# Patient Record
Sex: Female | Born: 1970 | Race: White | Hispanic: No | Marital: Married | Smoking: Current every day smoker
Health system: Southern US, Community
[De-identification: ages and names within clinical notes are randomized; demographics above are authoritative.]

## PROBLEM LIST (undated history)

## (undated) DIAGNOSIS — J45909 Unspecified asthma, uncomplicated: Secondary | ICD-10-CM

## (undated) DIAGNOSIS — K219 Gastro-esophageal reflux disease without esophagitis: Secondary | ICD-10-CM

## (undated) DIAGNOSIS — T8859XA Other complications of anesthesia, initial encounter: Secondary | ICD-10-CM

## (undated) DIAGNOSIS — N12 Tubulo-interstitial nephritis, not specified as acute or chronic: Secondary | ICD-10-CM

## (undated) DIAGNOSIS — I10 Essential (primary) hypertension: Secondary | ICD-10-CM

## (undated) HISTORY — PX: CHOLECYSTECTOMY: SHX55

## (undated) HISTORY — DX: Tubulo-interstitial nephritis, not specified as acute or chronic: N12

## (undated) HISTORY — PX: OTHER SURGICAL HISTORY: SHX169

## (undated) HISTORY — PX: BACK SURGERY: SHX140

## (undated) HISTORY — PX: NECK SURGERY: SHX720

---

## 2012-09-01 DIAGNOSIS — F419 Anxiety disorder, unspecified: Secondary | ICD-10-CM | POA: Insufficient documentation

## 2012-09-01 DIAGNOSIS — G8929 Other chronic pain: Secondary | ICD-10-CM | POA: Insufficient documentation

## 2013-09-03 DIAGNOSIS — M961 Postlaminectomy syndrome, not elsewhere classified: Secondary | ICD-10-CM | POA: Insufficient documentation

## 2013-09-13 DIAGNOSIS — M5136 Other intervertebral disc degeneration, lumbar region: Secondary | ICD-10-CM | POA: Insufficient documentation

## 2014-06-20 DIAGNOSIS — N63 Unspecified lump in unspecified breast: Secondary | ICD-10-CM | POA: Insufficient documentation

## 2014-06-20 DIAGNOSIS — N930 Postcoital and contact bleeding: Secondary | ICD-10-CM | POA: Insufficient documentation

## 2014-12-05 DIAGNOSIS — Z803 Family history of malignant neoplasm of breast: Secondary | ICD-10-CM | POA: Insufficient documentation

## 2014-12-05 DIAGNOSIS — Z9889 Other specified postprocedural states: Secondary | ICD-10-CM | POA: Insufficient documentation

## 2014-12-05 DIAGNOSIS — Z87891 Personal history of nicotine dependence: Secondary | ICD-10-CM | POA: Insufficient documentation

## 2016-05-07 DIAGNOSIS — M25559 Pain in unspecified hip: Secondary | ICD-10-CM | POA: Insufficient documentation

## 2016-08-16 DIAGNOSIS — M5412 Radiculopathy, cervical region: Secondary | ICD-10-CM | POA: Insufficient documentation

## 2018-03-04 DIAGNOSIS — R000 Tachycardia, unspecified: Secondary | ICD-10-CM

## 2018-03-04 HISTORY — DX: Tachycardia, unspecified: R00.0

## 2019-02-27 ENCOUNTER — Encounter: Payer: Self-pay | Admitting: Emergency Medicine

## 2019-02-27 ENCOUNTER — Ambulatory Visit (INDEPENDENT_AMBULATORY_CARE_PROVIDER_SITE_OTHER): Payer: Self-pay

## 2019-02-27 ENCOUNTER — Other Ambulatory Visit: Payer: Self-pay

## 2019-02-27 ENCOUNTER — Ambulatory Visit
Admission: EM | Admit: 2019-02-27 | Discharge: 2019-02-27 | Disposition: A | Discharge status: Home / Self Care | Payer: Self-pay | Attending: Emergency Medicine | Admitting: Emergency Medicine

## 2019-02-27 DIAGNOSIS — M79642 Pain in left hand: Secondary | ICD-10-CM

## 2019-02-27 DIAGNOSIS — S80211A Abrasion, right knee, initial encounter: Secondary | ICD-10-CM

## 2019-02-27 DIAGNOSIS — R2232 Localized swelling, mass and lump, left upper limb: Secondary | ICD-10-CM
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
## 2019-08-03 ENCOUNTER — Encounter (HOSPITAL_COMMUNITY): Payer: Self-pay | Admitting: Emergency Medicine

## 2019-08-03 ENCOUNTER — Emergency Department (HOSPITAL_COMMUNITY)
Admission: EM | Admit: 2019-08-03 | Discharge: 2019-08-04 | Disposition: A | Discharge status: Home / Self Care | Payer: PRIVATE HEALTH INSURANCE | Attending: Emergency Medicine | Admitting: Emergency Medicine

## 2019-08-03 DIAGNOSIS — R112 Nausea with vomiting, unspecified: Secondary | ICD-10-CM | POA: Insufficient documentation

## 2019-08-03 DIAGNOSIS — F17210 Nicotine dependence, cigarettes, uncomplicated: Secondary | ICD-10-CM | POA: Diagnosis not present

## 2019-08-03 DIAGNOSIS — R1031 Right lower quadrant pain: Secondary | ICD-10-CM | POA: Insufficient documentation

## 2019-08-03 LAB — CBC
HCT: 46 % (ref 36.0–46.0)
Hemoglobin: 15.5 g/dL — ABNORMAL HIGH (ref 12.0–15.0)
MCH: 34.4 pg — ABNORMAL HIGH (ref 26.0–34.0)
MCHC: 33.7 g/dL (ref 30.0–36.0)
MCV: 102.2 fL — ABNORMAL HIGH (ref 80.0–100.0)
Platelets: 272 10*3/uL (ref 150–400)
RBC: 4.5 MIL/uL (ref 3.87–5.11)
RDW: 13.4 % (ref 11.5–15.5)
WBC: 9.5 10*3/uL (ref 4.0–10.5)
nRBC: 0 % (ref 0.0–0.2)

## 2019-08-03 MED ORDER — SODIUM CHLORIDE 0.9% FLUSH: 3.0000 mL | Freq: Once | INTRAVENOUS | Status: DC

## 2019-08-03 MED ORDER — ONDANSETRON 4 MG PO TBDP
4.0000 mg | ORAL_TABLET | Freq: Once | ORAL | Status: AC | PRN
  Administered 2019-08-03: 4 mg via ORAL
  Filled 2019-08-03: qty 1

## 2019-08-03 NOTE — ED Triage Notes (Signed)
Patient in POV, reports abd pain, N/V X2 days that has gotten progressively worse. Patient actively dry heaving in triage.

## 2019-08-04 ENCOUNTER — Encounter (HOSPITAL_COMMUNITY): Payer: Self-pay | Admitting: Radiology

## 2019-08-04 ENCOUNTER — Emergency Department (HOSPITAL_COMMUNITY): Payer: PRIVATE HEALTH INSURANCE

## 2019-08-04 LAB — COMPREHENSIVE METABOLIC PANEL
ALT: 63 U/L — ABNORMAL HIGH (ref 0–44)
AST: 61 U/L — ABNORMAL HIGH (ref 15–41)
Albumin: 3.9 g/dL (ref 3.5–5.0)
Alkaline Phosphatase: 95 U/L (ref 38–126)
Anion gap: 13 (ref 5–15)
BUN: 6 mg/dL (ref 6–20)
CO2: 22 mmol/L (ref 22–32)
Calcium: 9.6 mg/dL (ref 8.9–10.3)
Chloride: 107 mmol/L (ref 98–111)
Creatinine, Ser: 0.56 mg/dL (ref 0.44–1.00)
GFR calc Af Amer: >60 mL/min (ref >60)
GFR calc non Af Amer: >60 mL/min (ref >60)
Glucose, Bld: 111 mg/dL — ABNORMAL HIGH (ref 70–99)
Potassium: 3.7 mmol/L (ref 3.5–5.1)
Sodium: 142 mmol/L (ref 135–145)
Total Bilirubin: 0.5 mg/dL (ref 0.3–1.2)
Total Protein: 7.3 g/dL (ref 6.5–8.1)

## 2019-08-04 LAB — URINALYSIS, ROUTINE W REFLEX MICROSCOPIC
Bacteria, UA: NONE SEEN
Bilirubin Urine: NEGATIVE
Glucose, UA: NEGATIVE mg/dL
Ketones, ur: NEGATIVE mg/dL
Leukocytes,Ua: NEGATIVE
Nitrite: NEGATIVE
Protein, ur: NEGATIVE mg/dL
Specific Gravity, Urine: >1.046 — ABNORMAL HIGH (ref 1.005–1.030)
pH: 5 (ref 5.0–8.0)

## 2019-08-04 LAB — HCG, QUANTITATIVE, PREGNANCY: hCG, Beta Chain, Quant, S: 1 m[IU]/mL (ref <5)

## 2019-08-04 LAB — LIPASE, BLOOD: Lipase: 43 U/L (ref 11–51)

## 2019-08-04 MED ORDER — ONDANSETRON HCL 4 MG/2ML IJ SOLN
4.0000 mg | Freq: Once | INTRAMUSCULAR | Status: AC
  Administered 2019-08-04: 4 mg via INTRAVENOUS
  Filled 2019-08-04: qty 2

## 2019-08-04 MED ORDER — IOHEXOL 300 MG/ML  SOLN
100.0000 mL | Freq: Once | INTRAMUSCULAR | Status: AC | PRN
  Administered 2019-08-04: 100 mL via INTRAVENOUS

## 2019-08-04 MED ORDER — SODIUM CHLORIDE 0.9 % IV BOLUS
1000.0000 mL | Freq: Once | INTRAVENOUS | Status: AC
  Administered 2019-08-04: 1000 mL via INTRAVENOUS

## 2019-08-04 MED ORDER — HYDROMORPHONE HCL 1 MG/ML IJ SOLN
1.0000 mg | Freq: Once | INTRAMUSCULAR | Status: AC
  Administered 2019-08-04: 1 mg via INTRAVENOUS
  Filled 2019-08-04: qty 1

## 2019-08-04 MED ORDER — HYDROMORPHONE HCL 1 MG/ML IJ SOLN
INTRAMUSCULAR | Status: AC
  Filled 2019-08-04: qty 1

## 2019-08-04 MED ORDER — ONDANSETRON HCL 4 MG/2ML IJ SOLN
INTRAMUSCULAR | Status: AC
  Filled 2019-08-04: qty 2

## 2019-08-04 MED ORDER — ONDANSETRON 4 MG PO TBDP: 4.0000 mg | ORAL_TABLET | Freq: Three times a day (TID) | ORAL | 0 refills | Status: DC | PRN

## 2019-08-04 NOTE — Discharge Instructions (Addendum)
Please drink plenty of fluids to stay hydrated Take Zofran as needed for nausea Try a heating pad on the abdomen for pain Return to the ER if you are worsening

## 2019-08-04 NOTE — ED Notes (Signed)
Pt transported to CT ?

## 2019-08-04 NOTE — ED Notes (Signed)
See downtime charting for med administration.

## 2019-08-04 NOTE — ED Provider Notes (Signed)
[facility] EMERGENCY DEPARTMENT Provider Note   [name]: [id number] Arrival date & time: 08/03/19  2244     History Chief Complaint  Patient presents with  . Emesis    Tammy Decker is a 49 y.o. female who presents with abdominal pain and vomiting. Pt states that she started to have abodminal pain and nausea yesterday morning. It's over the right lower part of the abdomen. She reports associated nausea, vomiting, and abdominal distension. Past surgical hx significant for cholecystectomy and endometrial ablation. She denies fever, chills, chest pain, SOB, diarrhea, constipation, urinary symptoms, vaginal bleeding or discharge. She states she recently moved to the area and is on vacation. She has been drinking some alcohol but denies excessive intake or Tylenol use. She denies having similar pain before. She had COVID in February and was told by her heart doctor she may have mild damage to her heart from the virus.  HPI     History reviewed. No pertinent past medical history.  There are no problems to display for this patient.   Past Surgical History:  Procedure Laterality Date  . uterine ablation       OB History   No obstetric history on file.     Family History  Problem Relation Age of Onset  . Diabetes Mother   . Diabetes Father     Social History   Tobacco Use  . Smoking status: Current Every Day Smoker    Packs/day: 0.15  . Smokeless tobacco: Never Used  Substance Use Topics  . Alcohol use: Yes    Comment: occasionally  . Drug use: Never    Home Medications Prior to Admission medications   Not on File    Allergies    Ciprofloxacin  Review of Systems   Review of Systems  Constitutional: Negative for chills and fever.  Respiratory: Negative for shortness of breath.   Cardiovascular: Negative for chest pain.  Gastrointestinal: Positive for abdominal pain, nausea and vomiting. Negative for constipation and diarrhea.    Genitourinary: Negative for dysuria, flank pain, pelvic pain, vaginal bleeding and vaginal discharge.  Musculoskeletal: Negative for back pain.  All other systems reviewed and are negative.   Physical Exam Updated Vital Signs BP (!) 130/106 (BP Location: Right Arm)   Pulse (!) 115   Temp 98.2 F (36.8 C) (Oral)   Resp 20   Ht 5\' 7"  (1.702 m)   Wt 81.6 kg   SpO2 97%   BMI 28.19 kg/m   Physical Exam Vitals and nursing note reviewed.  Constitutional:      General: She is in acute distress (in pain ).     Appearance: She is well-developed. She is obese. She is not ill-appearing.  HENT:     Head: Normocephalic and atraumatic.  Eyes:     General: No scleral icterus.       Right eye: No discharge.        Left eye: No discharge.     Conjunctiva/sclera: Conjunctivae normal.     Pupils: Pupils are equal, round, and reactive to light.  Cardiovascular:     Rate and Rhythm: Normal rate and regular rhythm.  Pulmonary:     Effort: Pulmonary effort is normal. No respiratory distress.     Breath sounds: Normal breath sounds.  Abdominal:     General: There is no distension.     Palpations: Abdomen is soft.     Tenderness: There is abdominal tenderness (rlq).  Musculoskeletal:  Cervical back: Normal range of motion.  Skin:    General: Skin is warm and dry.  Neurological:     Mental Status: She is alert and oriented to person, place, and time.  Psychiatric:        Behavior: Behavior normal.     ED Results / Procedures / Treatments   Labs (all labs ordered are listed, but only abnormal results are displayed) Labs Reviewed  COMPREHENSIVE METABOLIC PANEL - Abnormal; Notable for the following components:      Result Value   Glucose, Bld 111 (*)    AST 61 (*)    ALT 63 (*)    All other components within normal limits  CBC - Abnormal; Notable for the following components:   Hemoglobin 15.5 (*)    MCV 102.2 (*)    MCH 34.4 (*)    All other components within normal limits   URINALYSIS, ROUTINE W REFLEX MICROSCOPIC - Abnormal; Notable for the following components:   Specific Gravity, Urine >1.046 (*)    Hgb urine dipstick SMALL (*)    All other components within normal limits  LIPASE, BLOOD  HCG, QUANTITATIVE, PREGNANCY    EKG EKG Interpretation  Date/Time:  Tuesday August 03 2019 23:03:41 EDT Ventricular Rate:  102 PR Interval:  148 QRS Duration: 78 QT Interval:  346 QTC Calculation: 450 R Axis:   59 Text Interpretation: Poor data quality, interpretation may be adversely affected Sinus tachycardia Otherwise normal ECG Interpretation limited secondary to artifact Confirmed by Ripley Fraise [phone]) on 08/04/2019 12:08:48 AM   Radiology CT ABDOMEN PELVIS W CONTRAST  Result Date: 08/04/2019 CLINICAL DATA:  Right lower quadrant pain for several days, initial encounter EXAM: CT ABDOMEN AND PELVIS WITH CONTRAST TECHNIQUE: Multidetector CT imaging of the abdomen and pelvis was performed using the standard protocol following bolus administration of intravenous contrast. CONTRAST:  144mL OMNIPAQUE IOHEXOL 300 MG/ML  SOLN COMPARISON:  None. FINDINGS: Lower chest: No acute abnormality. Hepatobiliary: Fatty infiltration of the liver is noted. The gallbladder has been surgically removed. Pancreas: Unremarkable. No pancreatic ductal dilatation or surrounding inflammatory changes. Spleen: Multiple calcified granulomas are noted within the spleen. Adrenals/Urinary Tract: Adrenal glands are within normal limits. Kidneys demonstrate a normal enhancement pattern without renal calculi or obstructive changes. A few scattered small cysts are noted in the right kidney. No obstructive changes are noted. The ureters are unremarkable. The bladder is well distended. Stomach/Bowel: The appendix is within normal limits. No obstructive or inflammatory changes of the colon are seen. The small bowel and stomach appear within normal limits. Vascular/Lymphatic: No significant vascular findings  are present. No enlarged abdominal or pelvic lymph nodes. Reproductive: Follicular changes are noted bilaterally. Uterus is within normal limits. Other: No abdominal wall hernia or abnormality. No abdominopelvic ascites. Musculoskeletal: Spinal stimulator is noted. No acute bony abnormality is seen. IMPRESSION: Normal-appearing appendix. Chronic changes as described above without acute abnormality. Electronically Signed   By: Inez Catalina M.D.   On: 08/04/2019 01:19    Procedures Procedures (including critical care time)  Medications Ordered in ED Medications  sodium chloride flush (NS) 0.9 % injection 3 mL (3 mLs Intravenous Not Given 08/03/19 2351)  HYDROmorphone (DILAUDID) 1 MG/ML injection (has no administration in time range)  ondansetron (ZOFRAN) 4 MG/2ML injection (has no administration in time range)  ondansetron (ZOFRAN-ODT) disintegrating tablet 4 mg (4 mg Oral Given 08/03/19 2305)  sodium chloride 0.9 % bolus 1,000 mL (0 mLs Intravenous Stopped 08/04/19 0254)  HYDROmorphone (DILAUDID) injection 1 mg (  1 mg Intravenous Given 08/04/19 0010)  ondansetron (ZOFRAN) injection 4 mg (4 mg Intravenous Given 08/04/19 0010)  iohexol (OMNIPAQUE) 300 MG/ML solution 100 mL (100 mLs Intravenous Contrast Given 08/04/19 0052)    ED Course  I have reviewed the triage vital signs and the nursing notes.  Pertinent labs & imaging results that were available during my care of the patient were reviewed by me and considered in my medical decision making (see chart for details).  49 year old female presents with RLQ abdominal pain, N/V since yesterday morning. On initial exam she is distressed due to pain. Vitals are reassuring. Will give pain meds and antiemetics and reassess. CBC shows mild polycythemia and elevated MCV. She is a smoker and may be mildly dehydrated. CMP shows mild transaminitis. Lipase is normal. Will order CT A/P  12:55 AM Rechecked pt. She is feeling much better but still has abdominal pain. She  is tender in the RLQ. CT scan is pending.  CT is negative for acute pathology. UA is negative for UTI. Discussed with patient. Likely gastroenteritis. She could have a muscle strain due to emesis. We discussed pelvic exam but she declined. She tolerated PO. Will give rx for zofran and return precautions.  MDM Rules/Calculators/A&P                       Final Clinical Impression(s) / ED Diagnoses Final diagnoses:  Non-intractable vomiting with nausea, unspecified vomiting type    Rx / DC Orders ED Discharge Orders    None       Bethel Born, PA-C 08/04/19 0308    Zadie Rhine, MD 08/05/19 [phone]

## 2020-01-03 ENCOUNTER — Emergency Department (HOSPITAL_COMMUNITY): Payer: PRIVATE HEALTH INSURANCE

## 2020-01-03 ENCOUNTER — Emergency Department (HOSPITAL_COMMUNITY)
Admission: EM | Admit: 2020-01-03 | Discharge: 2020-01-03 | Disposition: A | Discharge status: Home / Self Care | Payer: PRIVATE HEALTH INSURANCE | Attending: Emergency Medicine | Admitting: Emergency Medicine

## 2020-01-03 ENCOUNTER — Encounter (HOSPITAL_COMMUNITY): Payer: Self-pay

## 2020-01-03 ENCOUNTER — Other Ambulatory Visit: Payer: Self-pay

## 2020-01-03 DIAGNOSIS — F17210 Nicotine dependence, cigarettes, uncomplicated: Secondary | ICD-10-CM | POA: Diagnosis not present

## 2020-01-03 DIAGNOSIS — R531 Weakness: Secondary | ICD-10-CM | POA: Diagnosis not present

## 2020-01-03 DIAGNOSIS — Z7982 Long term (current) use of aspirin: Secondary | ICD-10-CM | POA: Diagnosis not present

## 2020-01-03 DIAGNOSIS — Z8616 Personal history of COVID-19: Secondary | ICD-10-CM | POA: Diagnosis not present

## 2020-01-03 DIAGNOSIS — I10 Essential (primary) hypertension: Secondary | ICD-10-CM | POA: Diagnosis not present

## 2020-01-03 DIAGNOSIS — R0602 Shortness of breath: Secondary | ICD-10-CM | POA: Diagnosis not present

## 2020-01-03 HISTORY — DX: Essential (primary) hypertension: I10

## 2020-01-03 LAB — CBC
HCT: 44.9 % (ref 36.0–46.0)
Hemoglobin: 14.7 g/dL (ref 12.0–15.0)
MCH: 33.7 pg (ref 26.0–34.0)
MCHC: 32.7 g/dL (ref 30.0–36.0)
MCV: 103 fL — ABNORMAL HIGH (ref 80.0–100.0)
Platelets: 228 10*3/uL (ref 150–400)
RBC: 4.36 MIL/uL (ref 3.87–5.11)
RDW: 13.2 % (ref 11.5–15.5)
WBC: 7.4 10*3/uL (ref 4.0–10.5)
nRBC: 0 % (ref 0.0–0.2)

## 2020-01-03 LAB — BASIC METABOLIC PANEL
Anion gap: 11 (ref 5–15)
BUN: <5 mg/dL — ABNORMAL LOW (ref 6–20)
CO2: 21 mmol/L — ABNORMAL LOW (ref 22–32)
Calcium: 8.6 mg/dL — ABNORMAL LOW (ref 8.9–10.3)
Chloride: 106 mmol/L (ref 98–111)
Creatinine, Ser: 0.49 mg/dL (ref 0.44–1.00)
GFR, Estimated: >60 mL/min (ref >60)
Glucose, Bld: 110 mg/dL — ABNORMAL HIGH (ref 70–99)
Potassium: 3.7 mmol/L (ref 3.5–5.1)
Sodium: 138 mmol/L (ref 135–145)

## 2020-01-03 LAB — URINALYSIS, ROUTINE W REFLEX MICROSCOPIC
Bilirubin Urine: NEGATIVE
Glucose, UA: NEGATIVE mg/dL
Hgb urine dipstick: NEGATIVE
Ketones, ur: 5 mg/dL — AB
Leukocytes,Ua: NEGATIVE
Nitrite: POSITIVE — AB
Protein, ur: NEGATIVE mg/dL
Specific Gravity, Urine: 1.023 (ref 1.005–1.030)
pH: 5 (ref 5.0–8.0)

## 2020-01-03 LAB — D-DIMER, QUANTITATIVE: D-Dimer, Quant: 0.56 ug/mL-FEU — ABNORMAL HIGH (ref 0.00–0.50)

## 2020-01-03 LAB — TROPONIN I (HIGH SENSITIVITY)
Troponin I (High Sensitivity): 6 ng/L (ref <18)
Troponin I (High Sensitivity): 5 ng/L (ref <18)

## 2020-01-03 LAB — RESPIRATORY PANEL BY RT PCR (FLU A&B, COVID)
Influenza A by PCR: NEGATIVE
Influenza B by PCR: NEGATIVE
SARS Coronavirus 2 by RT PCR: NEGATIVE

## 2020-01-03 LAB — BRAIN NATRIURETIC PEPTIDE: B Natriuretic Peptide: 47.4 pg/mL (ref 0.0–100.0)

## 2020-01-03 LAB — I-STAT BETA HCG BLOOD, ED (MC, WL, AP ONLY): I-stat hCG, quantitative: <5 m[IU]/mL (ref <5)

## 2020-01-03 MED ORDER — ALBUTEROL SULFATE HFA 108 (90 BASE) MCG/ACT IN AERS
1.0000 | INHALATION_SPRAY | Freq: Four times a day (QID) | RESPIRATORY_TRACT | 0 refills | Status: DC | PRN
Start: 2020-01-03 — End: 2020-02-15

## 2020-01-03 MED ORDER — IOHEXOL 350 MG/ML SOLN
75.0000 mL | Freq: Once | INTRAVENOUS | Status: AC | PRN
  Administered 2020-01-03: 75 mL via INTRAVENOUS

## 2020-01-03 NOTE — ED Provider Notes (Signed)
[facility] EMERGENCY DEPARTMENT Provider Note   [name]: [id number] Arrival date & time: 01/03/20  3329     History Chief Complaint  Patient presents with  . Weakness  . Shortness of Breath    Tammy Decker is a 49 y.o. female.  HPI 49 year old female with history of hypertension presents to the ER with complaints of increasing weakness, shortness of breath over the last few days.  Patient states that she had Covid in February and since then has never gotten back to her baseline.  She was diagnosed with hypertension after her illness, and has been seeing cardiology.  She has also been having some worsening shortness of breath at night and was given supplemental oxygen to help with this.  She has no prior history of COPD, though she does endorse a 1-1 and half pack per day smoking history up until she had Covid.  She has not smoked since.  She has been using 2 L nasal cannula.  Denies any fevers or chills.  She states that she has worsening shortness of breath when she lays, especially on her left side.  Denies any chest pain.  She has also had a dry nonproductive cough.  She was told by her cardiologist to come in in the ER.  She has not noticed any lower extremity swelling.  She denies any recent travel, surgeries, not on supplemental estrogen.    Past Medical History:  Diagnosis Date  . Hypertension     There are no problems to display for this patient.   Past Surgical History:  Procedure Laterality Date  . BACK SURGERY    . NECK SURGERY    . uterine ablation       OB History   No obstetric history on file.     Family History  Problem Relation Age of Onset  . Diabetes Mother   . Diabetes Father     Social History   Tobacco Use  . Smoking status: Current Every Day Smoker    Packs/day: 0.15  . Smokeless tobacco: Never Used  Substance Use Topics  . Alcohol use: Yes    Comment: occasionally  . Drug use: Never    Home Medications Prior  to Admission medications   Medication Sig Start Date End Date Taking? Authorizing Provider  albuterol (VENTOLIN HFA) 108 (90 Base) MCG/ACT inhaler Inhale 1-2 puffs into the lungs every 6 (six) hours as needed for wheezing or shortness of breath. 01/03/20   Mare Ferrari, PA-C  aspirin EC 81 MG tablet Take 81 mg by mouth daily.    [provider]  loperamide (IMODIUM A-D) 2 MG tablet Take 2 mg by mouth 4 (four) times daily as needed for diarrhea or loose stools.    [provider]  metoprolol succinate (TOPROL-XL) 50 MG 24 hr tablet Take 50 mg by mouth at bedtime. 07/11/19   [provider]  omeprazole (PRILOSEC OTC) 20 MG tablet Take 20 mg by mouth daily as needed (for acid reflux).    [provider]  ondansetron (ZOFRAN ODT) 4 MG disintegrating tablet Take 1 tablet (4 mg total) by mouth every 8 (eight) hours as needed for nausea or vomiting. 08/04/19   Bethel Born, PA-C    Allergies    Ciprofloxacin  Review of Systems   Review of Systems  Constitutional: Negative for chills and fever.  HENT: Negative for ear pain and sore throat.   Eyes: Negative for pain and visual disturbance.  Respiratory:  Positive for cough and shortness of breath.   Cardiovascular: Negative for chest pain, palpitations and leg swelling.  Gastrointestinal: Negative for abdominal pain and vomiting.  Genitourinary: Negative for dysuria and hematuria.  Musculoskeletal: Negative for arthralgias and back pain.  Skin: Negative for color change and rash.  Neurological: Negative for seizures and syncope.  All other systems reviewed and are negative.   Physical Exam Updated Vital Signs BP 114/75   Pulse 89   Temp 98.4 F (36.9 C) (Oral)   Resp 20   Ht 5\' 7"  (1.702 m)   Wt 86.2 kg   SpO2 98% Comment: remained 97-98% during ambulation, pt reports feeling sob nol obvious signs of distrtess   BMI 29.76 kg/m   Physical Exam Vitals and nursing note reviewed.  Constitutional:        General: She is not in acute distress.    Appearance: She is well-developed. She is not ill-appearing, toxic-appearing or diaphoretic.  HENT:     Head: Normocephalic and atraumatic.  Eyes:     Conjunctiva/sclera: Conjunctivae normal.  Cardiovascular:     Rate and Rhythm: Normal rate and regular rhythm.     Heart sounds: No murmur heard.   Pulmonary:     Effort: Pulmonary effort is normal. No respiratory distress.     Breath sounds: Normal breath sounds. No decreased breath sounds, wheezing, rhonchi or rales.     Comments: Currently on 2 L nasal cannula.  Respirations equal and unlabored, speaking full sentences without increased work of breathing.  Lung sounds clear without any evidence of rales, wheezes, rhonchi. Chest:     Chest wall: No tenderness.  Abdominal:     Palpations: Abdomen is soft.     Tenderness: There is no abdominal tenderness.  Musculoskeletal:     Cervical back: Neck supple.     Right lower leg: No tenderness. No edema.     Left lower leg: No tenderness. No edema.  Skin:    General: Skin is warm and dry.     Findings: No erythema or rash.  Neurological:     General: No focal deficit present.     Mental Status: She is alert.  Psychiatric:        Mood and Affect: Mood normal.        Behavior: Behavior normal.     ED Results / Procedures / Treatments   Labs (all labs ordered are listed, but only abnormal results are displayed) Labs Reviewed  BASIC METABOLIC PANEL - Abnormal; Notable for the following components:      Result Value   CO2 21 (*)    Glucose, Bld 110 (*)    BUN <5 (*)    Calcium 8.6 (*)    All other components within normal limits  CBC - Abnormal; Notable for the following components:   MCV 103.0 (*)    All other components within normal limits  URINALYSIS, ROUTINE W REFLEX MICROSCOPIC - Abnormal; Notable for the following components:   Color, Urine AMBER (*)    Ketones, ur 5 (*)    Nitrite POSITIVE (*)    Bacteria, UA RARE (*)     All other components within normal limits  D-DIMER, QUANTITATIVE (NOT AT [facility]) - Abnormal; Notable for the following components:   D-Dimer, Quant 0.56 (*)    All other components within normal limits  RESPIRATORY PANEL BY RT PCR (FLU A&B, COVID)  BRAIN NATRIURETIC PEPTIDE  I-STAT BETA HCG BLOOD, ED ([facility], [facility], [facility] ONLY)  TROPONIN I (  HIGH SENSITIVITY)  TROPONIN I (HIGH SENSITIVITY)    EKG None  Radiology DG Chest 2 View  Result Date: 01/03/2020 CLINICAL DATA:  Chest pain shortness of breath. Pain under LEFT clavicle since Friday, worse last night, history of hypertension EXAM: CHEST - 2 VIEW COMPARISON:  Abdomen pelvis CT from August 04, 2019 FINDINGS: Signs of prior cervical spinal fusion.  The Spinal nerve stimulator remains in place. Power pack off field of view. Trachea midline. Cardiomediastinal contours and hilar structures are normal. Linear opacities present at the LEFT lung base. No lobar consolidation. No sign of pleural effusion. On limited assessment no acute skeletal process. IMPRESSION: Linear opacities at the LEFT lung base, likely atelectasis. Electronically Signed   By: Donzetta Kohut M.D.   On: 01/03/2020 08:10   CT Angio Chest PE W and/or Wo Contrast  Result Date: 01/03/2020 CLINICAL DATA:  Chest pain or SOB, pleurisy or effusion suspected Chest pain and shortness of breath. EXAM: CT ANGIOGRAPHY CHEST WITH CONTRAST TECHNIQUE: Multidetector CT imaging of the chest was performed using the standard protocol during bolus administration of intravenous contrast. Multiplanar CT image reconstructions and MIPs were obtained to evaluate the vascular anatomy. CONTRAST:  42mL OMNIPAQUE IOHEXOL 350 MG/ML SOLN COMPARISON:  Chest radiographs earlier today. FINDINGS: Cardiovascular: There are no filling defects within the pulmonary arteries to suggest pulmonary embolus. The thoracic aorta is normal in caliber without dissection. Conventional branching pattern from the aortic arch. Upper normal heart  size. No pericardial effusion. No significant coronary artery calcifications. Mediastinum/Nodes: Calcified right hilar lymph nodes consistent with prior granulomatous disease. No noncalcified adenopathy. Decompressed esophagus without wall thickening. No visualized thyroid nodule. Lungs/Pleura: Calcified nodule in the right lower lobe consistent with prior granulomatous disease. No noncalcified pulmonary nodules. Linear opacity in the lingula typical of atelectasis. Minor additional hypoventilatory changes in the dependent lungs. No confluent consolidation or evidence of pneumonia. No findings of pulmonary edema. No pleural fluid. Minor central bronchial thickening. Upper Abdomen: Calcified granuloma in the spleen. No acute upper abdominal findings. Musculoskeletal: Spinal stimulator in place. There are no acute or suspicious osseous abnormalities. Review of the MIP images confirms the above findings. IMPRESSION: 1. No pulmonary embolus. 2. Minor central bronchial thickening, can be seen with smoking, bronchitis or reactive airways disease. 3. Linear subsegmental atelectasis in the lingula. 4. Sequela of prior granulomatous disease with calcified pulmonary nodule, right hilar lymph nodes, and calcified granuloma in the spleen. Electronically Signed   By: Narda Rutherford M.D.   On: 01/03/2020 16:17    Procedures Procedures (including critical care time)  Medications Ordered in ED Medications  iohexol (OMNIPAQUE) 350 MG/ML injection 75 mL (75 mLs Intravenous Contrast Given 01/03/20 1603)    ED Course  I have reviewed the triage vital signs and the nursing notes.  Pertinent labs & imaging results that were available during my care of the patient were reviewed by me and considered in my medical decision making (see chart for details).    MDM Rules/Calculators/A&P                          Pleasant 49 year old female with complaints of worsening shortness of breath over the last several weeks.  On  presentation, she is alert, oriented, nontoxic-appearing, no acute distress, speaking full sentences without increased work of breathing.  On 2 L nasal cannula in the room.  Physical exam without noticeable wheezes, rhonchi or rales.  Vitals overall reassuring, no evidence of tachypnea or hypoxia.  Abdomen soft and nontender.  No excessive lower extremity edema noted.  DDx includes pneumonia, CHF, COPD, cardiomyopathy after Covid, viral infection, PE, anemia  Labs reviewed and interpreted by me -CBC without leukocytosis, normal hemoglobin -BMP without any significant electrode abnormalities, normal renal function, mildly decreased bicarb normal anion gap -Pregnancy negative -BNP is normal -Covid and flu is negative -D-dimer slightly elevated at 0.56 -Delta troponin is negative -UA with positive nitrates, rare bacteria, however patient does not endorse any dysuria, though notes some frequency  Imaging reviewed and interpreted by me -Chest x-ray with linear opacities in the left lung base.  No clear evidence of infection or fluid overload  ED EKG reviewed by me -Normal sinus rhythm -Given elevated D-dimer, CT PE study was ordered.  This was negative for PE, however did note some central bronchial thickening, evidence of sequela of granulomatous disease with calcified pulmonary nodule, right hilar lymph nodes and calcified granuloma in the spleen.  No evidence of acute life-threatening cause at significant risk  MDM: Patient had a fairly thorough work-up without clear evidence of causes of progressive shortness of breath.  She ambulated here in the ED without evidence of hypoxia without oxygen.  Will provide the patient with albuterol and refer to pulmonology.  Encouraged follow-up with her PCP as well. Will provide albuterol inhaler.  Return precautions discussed.  She voices understanding and is agreeable  Case discussed w/ Dr. Freida Busman who is agreeable to the above plan and disposition     Final Clinical Impression(s) / ED Diagnoses Final diagnoses:  Shortness of breath    Rx / DC Orders ED Discharge Orders         Ordered    albuterol (VENTOLIN HFA) 108 (90 Base) MCG/ACT inhaler  Every 6 hours PRN        01/03/20 1739           Leone Brand 01/03/20 1739    Lorre Nick, MD 01/03/20 2315

## 2020-01-03 NOTE — ED Notes (Signed)
Patient transported to CT 

## 2020-01-03 NOTE — ED Triage Notes (Signed)
Pt from home with ems c.o increased weakness and sob over the past few days, intermittent pain under her left collar bone. Pt has oxygen at home she wears as needed and she has been using it. Room air saturations 98%. Resp e.u

## 2020-01-03 NOTE — Discharge Instructions (Signed)
Please make sure to follow-up with with our pulmonology for further evaluation of your shortness of breath.  You may use the albuterol as needed.  Return to the ER for any new or worsening symptoms.

## 2020-02-15 ENCOUNTER — Encounter: Payer: Self-pay | Admitting: Internal Medicine

## 2020-02-15 ENCOUNTER — Ambulatory Visit (INDEPENDENT_AMBULATORY_CARE_PROVIDER_SITE_OTHER): Payer: No Typology Code available for payment source | Admitting: Internal Medicine

## 2020-02-15 ENCOUNTER — Other Ambulatory Visit: Payer: Self-pay

## 2020-02-15 VITALS — BP 142/84 | HR 118 | Temp 97.5°F | Ht 67.0 in | Wt 234.2 lb

## 2020-02-15 DIAGNOSIS — F17200 Nicotine dependence, unspecified, uncomplicated: Secondary | ICD-10-CM

## 2020-02-15 DIAGNOSIS — J310 Chronic rhinitis: Secondary | ICD-10-CM

## 2020-02-15 DIAGNOSIS — Z8616 Personal history of COVID-19: Secondary | ICD-10-CM | POA: Diagnosis not present

## 2020-02-15 DIAGNOSIS — R0602 Shortness of breath: Secondary | ICD-10-CM | POA: Diagnosis not present

## 2020-02-15 MED ORDER — FLUTICASONE PROPIONATE 50 MCG/ACT NA SUSP: 1.0000 | Freq: Every day | NASAL | 5 refills | Status: DC

## 2020-02-15 MED ORDER — ALBUTEROL SULFATE HFA 108 (90 BASE) MCG/ACT IN AERS
1.0000 | INHALATION_SPRAY | Freq: Four times a day (QID) | RESPIRATORY_TRACT | 11 refills | Status: DC | PRN
Start: 2020-02-15 — End: 2021-05-23

## 2020-02-15 MED ORDER — BUDESONIDE-FORMOTEROL FUMARATE 160-4.5 MCG/ACT IN AERO: 2.0000 | INHALATION_SPRAY | Freq: Two times a day (BID) | RESPIRATORY_TRACT | 12 refills | Status: DC

## 2020-02-15 NOTE — Progress Notes (Signed)
Tammy Decker    [id number]    1970/11/09  Primary Care Physician:Harwani, Marlane Mingle, MD  Referring Physician: Rinaldo Cloud, MD [phone] [place],  [place] [address] Reason for Consultation: "shortness of breath" Date of Consultation: 02/15/2020  Chief complaint:   Chief Complaint  Patient presents with  . Consult    Covid dx back in Feb 2021--  [facility] since that time.  Feels that this is worse with laying on her left side.  On oxygen 3 liters at bedtime.  Seen by cardiology for HTN. SHOB is worse with exertion.      HPI: Tammy Decker is a 49 y.o. woman with history of tobacco use disorder and covid 60 infection Feb 2021 who presents for new patient evaluation for shortness of breath.  She was not treated with steroids.  Prior to February she exercised daily, didn't take any medications. Since covid diagnosis she has been getting sob with walking. She has now gained 45 lbs since covid and has trouble with walking and pilates. She was not hospitalized with covid but was put on oxygen by her cardiologist. She has a puls oximeter at home and it drops down to 88-89%. She is also on metoprolol 200 mg daily for resting tachycardia.   Has been on albuterol inhaler for about a month which does help. She uses it about 2-3 times/day. Smoking cigarettes but is trying to cut back.   She notices that breathing gets worse when she sleeps on her left side as well as with exertion.  She does have a dry cough with chronic rhinitis and has a change in the quality of her voice.   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  Smoking history: she is a smoker but is trying to quit. Used to be 1ppd x 20 years.   Social History   Occupational History  . Not on file  Tobacco Use  . Smoking status: Current Every Day Smoker    Packs/day: 0.15  . Smokeless tobacco:  Never Used  . Tobacco comment: down to 3 cigs per day  Substance and Sexual Activity  . Alcohol use: Yes    Comment: occasionally  . Drug use: Never  . Sexual activity: Not on file    Relevant family history:  Family History  Problem Relation Age of Onset  . Diabetes Mother   . Diabetes Father     Past Medical History:  Diagnosis Date  . Hypertension     Past Surgical History:  Procedure Laterality Date  . BACK SURGERY    . NECK SURGERY    . uterine ablation       Physical Exam: Blood pressure (!) 142/84, pulse (!) 118, temperature (!) 97.5 F (36.4 C), temperature source Tympanic, height 5\' 7"  (1.702 m), weight 234 lb 4 oz (106.3 kg), SpO2 97 %. Gen:      No acute distress ENT:  Mild nasal debris, no nasal polyps, mucus membranes moist Lungs:    No increased respiratory effort, symmetric chest wall excursion, clear to auscultation bilaterally, no wheezes or crackles CV:         Tachycardic, regular rhythm; no murmurs, rubs, or gallops.  No pedal edema Abd:      + bowel sounds; soft, non-tender; no distension MSK: no acute synovitis of DIP or PIP joints, no mechanics hands.  Skin:  Warm and dry; no rashes Neuro: normal speech, no focal facial asymmetry Psych: alert and oriented x3, normal mood and affect   Data Reviewed/Medical Decision Making:  Independent interpretation of tests: Imaging: . Review of patient's CT Angio Nov 11th 2021 images revealed central peribronchial thickening, no PE, RLL calcified nodule. The patient's images have been independently reviewed by me.    PFTs: None  Labs:  Lab Results  Component Value Date   WBC 7.4 01/03/2020   HGB 14.7 01/03/2020   HCT 44.9 01/03/2020   MCV 103.0 (H) 01/03/2020   PLT 228 01/03/2020   Lab Results  Component Value Date   NA 138 01/03/2020   K 3.7 01/03/2020   CL 106 01/03/2020   CO2 21 (L) 01/03/2020     Immunization status:  Immunization History  Administered Date(s) Administered  .  Tdap 11/25/2014    . I reviewed prior external note(s) from ED visits . I reviewed the result(s) of the labs and imaging as noted above.  . I have ordered PFT   Assessment:  Shortness of breath - suspected COPD Tobacco Use Disorder COVID 19 infection  Plan/Recommendations: Albuterol inhaler prn Suspect she will benefit from ICS-LABA. Will start symbicort.  Will obtain full set of PFTs  I personally spent 6 minutes counseling the patient regarding tobacco use disorder.  Patient is symptomatic from tobacco use disorder due to the following condition: COPD.  The patient's response was contemplative.  We discussed nicotine replacement therapy, Wellbutrin.  We identified to gather patient specific barriers to change.  The patient is open to future discussions about tobacco cessation.  We discussed disease management and progression at length today.    Return to Care: Return in about 2 months (around 04/17/2020).  Durel Salts, MD Pulmonary and Critical Care Medicine [facility] Office:[phone]  CC: Rinaldo Cloud, MD

## 2020-02-15 NOTE — Patient Instructions (Signed)
The patient should have follow up scheduled with myself in 2 months.   Prior to next visit patient should have: Full set of PFTs  Flonase - 1 spray on each side of your nose twice a day for first week, then 1 spray on each side.   Instructions for use:  If you also use a saline nasal spray or rinse, use that first.  Position the head with the chin slightly tucked. Use the right hand to spray into the left nostril and the right hand to spray into the left nostril.   Point the bottle away from the septum of your nose (cartilage that divides the two sides of your nose).   Hold the nostril closed on the opposite side from where you will spray  Spray once and gently sniff to pull the medicine into the higher parts of your nose.  Don't sniff too hard as the medicine will drain down the back of your throat instead.  Repeat with a second spray on the same side if prescribed.  Repeat on the other side of your nose.  Take the albuterol rescue inhaler every 4 to 6 hours as needed for wheezing or shortness of breath. You can also take it 15 minutes before exercise or exertional activity. Side effects include heart racing or pounding, jitters or anxiety. If you have a history of an irregular heart rhythm, it can make this worse. Can also give some patients a hard time sleeping.  To inhale the aerosol using an inhaler, follow these steps:  1. Remove the protective dust cap from the end of the mouthpiece. If the dust cap was not placed on the mouthpiece, check the mouthpiece for dirt or other objects. Be sure that the canister is fully and firmly inserted in the mouthpiece. 2. If you are using the inhaler for the first time or if you have not used the inhaler in more than 14 days, you will need to prime it. You may also need to prime the inhaler if it has been dropped. Ask your pharmacist or check the manufacturer's information if this happens. To prime the inhaler, shake it well and then press down on  the canister 4 times to release 4 sprays into the air, away from your face. Be careful not to get albuterol in your eyes. 3. Shake the inhaler well. 4. Breathe out as completely as possible through your mouth. 4. Hold the canister with the mouthpiece on the bottom, facing you and the canister pointing upward. Place the open end of the mouthpiece into your mouth. Close your lips tightly around the mouthpiece. 6. Breathe in slowly and deeply through the mouthpiece.At the same time, press down once on the container to spray the medication into your mouth. 7. Try to hold your breath for 10 seconds. remove the inhaler, and breathe out slowly. 8. If you were told to use 2 puffs, wait 1 minute and then repeat steps 3-7. 9. Replace the protective cap on the inhaler. 10. Clean your inhaler regularly. Follow the manufacturer's directions carefully and ask your doctor or pharmacist if you have any questions about cleaning your inhaler.  Check the back of the inhaler to keep track of the total number of doses left on the inhaler.

## 2020-04-09 ENCOUNTER — Other Ambulatory Visit: Payer: Self-pay

## 2020-04-09 ENCOUNTER — Emergency Department (HOSPITAL_COMMUNITY)
Admission: EM | Admit: 2020-04-09 | Discharge: 2020-04-10 | Disposition: A | Discharge status: Home / Self Care | Payer: PRIVATE HEALTH INSURANCE | Attending: Emergency Medicine | Admitting: Emergency Medicine

## 2020-04-09 DIAGNOSIS — R42 Dizziness and giddiness: Secondary | ICD-10-CM | POA: Diagnosis not present

## 2020-04-09 DIAGNOSIS — K921 Melena: Secondary | ICD-10-CM | POA: Insufficient documentation

## 2020-04-09 DIAGNOSIS — R140 Abdominal distension (gaseous): Secondary | ICD-10-CM | POA: Diagnosis not present

## 2020-04-09 DIAGNOSIS — Z5321 Procedure and treatment not carried out due to patient leaving prior to being seen by health care provider: Secondary | ICD-10-CM | POA: Diagnosis not present

## 2020-04-09 DIAGNOSIS — R109 Unspecified abdominal pain: Secondary | ICD-10-CM | POA: Diagnosis not present

## 2020-04-09 LAB — COMPREHENSIVE METABOLIC PANEL
ALT: 57 U/L — ABNORMAL HIGH (ref 0–44)
AST: 74 U/L — ABNORMAL HIGH (ref 15–41)
Albumin: 3.5 g/dL (ref 3.5–5.0)
Alkaline Phosphatase: 87 U/L (ref 38–126)
Anion gap: 11 (ref 5–15)
BUN: 9 mg/dL (ref 6–20)
CO2: 21 mmol/L — ABNORMAL LOW (ref 22–32)
Calcium: 8.7 mg/dL — ABNORMAL LOW (ref 8.9–10.3)
Chloride: 105 mmol/L (ref 98–111)
Creatinine, Ser: 0.53 mg/dL (ref 0.44–1.00)
GFR, Estimated: >60 mL/min (ref >60)
Glucose, Bld: 117 mg/dL — ABNORMAL HIGH (ref 70–99)
Potassium: 4.1 mmol/L (ref 3.5–5.1)
Sodium: 137 mmol/L (ref 135–145)
Total Bilirubin: 0.5 mg/dL (ref 0.3–1.2)
Total Protein: 7.1 g/dL (ref 6.5–8.1)

## 2020-04-09 LAB — CBC
HCT: 41.5 % (ref 36.0–46.0)
Hemoglobin: 14.2 g/dL (ref 12.0–15.0)
MCH: 36.2 pg — ABNORMAL HIGH (ref 26.0–34.0)
MCHC: 34.2 g/dL (ref 30.0–36.0)
MCV: 105.9 fL — ABNORMAL HIGH (ref 80.0–100.0)
Platelets: 234 10*3/uL (ref 150–400)
RBC: 3.92 MIL/uL (ref 3.87–5.11)
RDW: 13.4 % (ref 11.5–15.5)
WBC: 9.2 10*3/uL (ref 4.0–10.5)
nRBC: 0 % (ref 0.0–0.2)

## 2020-04-09 LAB — TYPE AND SCREEN
ABO/RH(D): O POS
Antibody Screen: NEGATIVE

## 2020-04-09 NOTE — ED Triage Notes (Signed)
Pt presents to ED POV. Pt c/o abd pain, distention, dizziness, and dark stools. Pt reports that she has noticed dark, loose stool for a few days. And reports feeling light headed upon standing

## 2020-04-10 MED ORDER — ACETAMINOPHEN 325 MG PO TABS: 650.0000 mg | ORAL_TABLET | Freq: Once | ORAL | Status: DC

## 2020-04-10 MED ORDER — IBUPROFEN 400 MG PO TABS: 400.0000 mg | ORAL_TABLET | Freq: Once | ORAL | Status: DC

## 2020-04-10 MED ORDER — ACETAMINOPHEN 325 MG PO TABS
650.0000 mg | ORAL_TABLET | Freq: Once | ORAL | Status: AC
  Administered 2020-04-10: 650 mg via ORAL

## 2020-04-10 NOTE — ED Notes (Signed)
Pt spoke with tech and decided to follow up with their pcp. Pt left at this time.

## 2020-04-22 ENCOUNTER — Inpatient Hospital Stay (HOSPITAL_COMMUNITY): Admission: RE | Admit: 2020-04-22 | Payer: No Typology Code available for payment source | Source: Ambulatory Visit

## 2020-04-24 ENCOUNTER — Ambulatory Visit: Payer: No Typology Code available for payment source | Admitting: Internal Medicine

## 2020-04-27 ENCOUNTER — Telehealth (HOSPITAL_COMMUNITY): Payer: Self-pay | Admitting: Gastroenterology

## 2020-04-27 ENCOUNTER — Other Ambulatory Visit (HOSPITAL_COMMUNITY): Payer: Self-pay | Admitting: Gastroenterology

## 2020-04-27 ENCOUNTER — Other Ambulatory Visit: Payer: Self-pay | Admitting: Gastroenterology

## 2020-04-27 DIAGNOSIS — R1084 Generalized abdominal pain: Secondary | ICD-10-CM

## 2020-04-27 NOTE — Telephone Encounter (Signed)
04/27/20~Called office ref AUTH required for CT Abd/Pelv w contrast, was transf by Delorise Jackson to VM for [facility]. Left detailed VM. NO Auth onfile, per [facility]. MF

## 2020-05-01 ENCOUNTER — Ambulatory Visit (HOSPITAL_COMMUNITY)
Admission: RE | Admit: 2020-05-01 | Discharge: 2020-05-01 | Disposition: A | Discharge status: Home / Self Care | Payer: No Typology Code available for payment source | Source: Ambulatory Visit | Attending: Gastroenterology | Admitting: Gastroenterology

## 2020-05-01 ENCOUNTER — Emergency Department (HOSPITAL_COMMUNITY)
Admission: EM | Admit: 2020-05-01 | Discharge: 2020-05-01 | Disposition: A | Discharge status: Home / Self Care | Payer: No Typology Code available for payment source | Attending: Emergency Medicine | Admitting: Emergency Medicine

## 2020-05-01 ENCOUNTER — Other Ambulatory Visit: Payer: Self-pay

## 2020-05-01 ENCOUNTER — Encounter (HOSPITAL_COMMUNITY): Payer: Self-pay | Admitting: Emergency Medicine

## 2020-05-01 DIAGNOSIS — F17210 Nicotine dependence, cigarettes, uncomplicated: Secondary | ICD-10-CM | POA: Insufficient documentation

## 2020-05-01 DIAGNOSIS — K460 Unspecified abdominal hernia with obstruction, without gangrene: Secondary | ICD-10-CM

## 2020-05-01 DIAGNOSIS — R69 Illness, unspecified: Secondary | ICD-10-CM | POA: Insufficient documentation

## 2020-05-01 DIAGNOSIS — R109 Unspecified abdominal pain: Secondary | ICD-10-CM | POA: Diagnosis present

## 2020-05-01 DIAGNOSIS — K469 Unspecified abdominal hernia without obstruction or gangrene: Secondary | ICD-10-CM | POA: Insufficient documentation

## 2020-05-01 DIAGNOSIS — R1084 Generalized abdominal pain: Secondary | ICD-10-CM | POA: Insufficient documentation

## 2020-05-01 DIAGNOSIS — Z79899 Other long term (current) drug therapy: Secondary | ICD-10-CM | POA: Diagnosis not present

## 2020-05-01 DIAGNOSIS — I10 Essential (primary) hypertension: Secondary | ICD-10-CM | POA: Insufficient documentation

## 2020-05-01 LAB — COMPREHENSIVE METABOLIC PANEL
ALT: 60 U/L — ABNORMAL HIGH (ref 0–44)
AST: 60 U/L — ABNORMAL HIGH (ref 15–41)
Albumin: 3.8 g/dL (ref 3.5–5.0)
Alkaline Phosphatase: 85 U/L (ref 38–126)
Anion gap: 14 (ref 5–15)
BUN: <5 mg/dL — ABNORMAL LOW (ref 6–20)
CO2: 20 mmol/L — ABNORMAL LOW (ref 22–32)
Calcium: 9.6 mg/dL (ref 8.9–10.3)
Chloride: 103 mmol/L (ref 98–111)
Creatinine, Ser: 0.64 mg/dL (ref 0.44–1.00)
GFR, Estimated: >60 mL/min (ref >60)
Glucose, Bld: 155 mg/dL — ABNORMAL HIGH (ref 70–99)
Potassium: 3.4 mmol/L — ABNORMAL LOW (ref 3.5–5.1)
Sodium: 137 mmol/L (ref 135–145)
Total Bilirubin: 0.6 mg/dL (ref 0.3–1.2)
Total Protein: 7.9 g/dL (ref 6.5–8.1)

## 2020-05-01 LAB — URINALYSIS, ROUTINE W REFLEX MICROSCOPIC
Bilirubin Urine: NEGATIVE
Glucose, UA: NEGATIVE mg/dL
Hgb urine dipstick: NEGATIVE
Ketones, ur: NEGATIVE mg/dL
Leukocytes,Ua: NEGATIVE
Nitrite: NEGATIVE
Protein, ur: NEGATIVE mg/dL
Specific Gravity, Urine: 1.012 (ref 1.005–1.030)
pH: 6 (ref 5.0–8.0)

## 2020-05-01 LAB — CBC
HCT: 46.2 % — ABNORMAL HIGH (ref 36.0–46.0)
Hemoglobin: 16 g/dL — ABNORMAL HIGH (ref 12.0–15.0)
MCH: 35.6 pg — ABNORMAL HIGH (ref 26.0–34.0)
MCHC: 34.6 g/dL (ref 30.0–36.0)
MCV: 102.9 fL — ABNORMAL HIGH (ref 80.0–100.0)
Platelets: 295 10*3/uL (ref 150–400)
RBC: 4.49 MIL/uL (ref 3.87–5.11)
RDW: 12.2 % (ref 11.5–15.5)
WBC: 12.9 10*3/uL — ABNORMAL HIGH (ref 4.0–10.5)
nRBC: 0 % (ref 0.0–0.2)

## 2020-05-01 LAB — LIPASE, BLOOD: Lipase: 50 U/L (ref 11–51)

## 2020-05-01 LAB — LACTIC ACID, PLASMA: Lactic Acid, Venous: 1.9 mmol/L (ref 0.5–1.9)

## 2020-05-01 MED ORDER — ONDANSETRON HCL 4 MG/2ML IJ SOLN
4.0000 mg | Freq: Once | INTRAMUSCULAR | Status: AC
  Administered 2020-05-01: 4 mg via INTRAVENOUS
  Filled 2020-05-01: qty 2

## 2020-05-01 MED ORDER — HYDROMORPHONE HCL 1 MG/ML IJ SOLN
0.5000 mg | Freq: Once | INTRAMUSCULAR | Status: AC
  Administered 2020-05-01: 0.5 mg via INTRAVENOUS
  Filled 2020-05-01: qty 1

## 2020-05-01 MED ORDER — FENTANYL CITRATE (PF) 100 MCG/2ML IJ SOLN
50.0000 ug | INTRAMUSCULAR | Status: DC | PRN
  Administered 2020-05-01: 50 ug via NASAL
  Filled 2020-05-01: qty 2

## 2020-05-01 MED ORDER — FENTANYL CITRATE (PF) 100 MCG/2ML IJ SOLN
100.0000 ug | Freq: Once | INTRAMUSCULAR | Status: AC
  Administered 2020-05-01: 100 ug via INTRAVENOUS
  Filled 2020-05-01: qty 2

## 2020-05-01 MED ORDER — KETOROLAC TROMETHAMINE 30 MG/ML IJ SOLN: 15.0000 mg | Freq: Once | INTRAMUSCULAR | Status: DC

## 2020-05-01 MED ORDER — IOHEXOL 300 MG/ML  SOLN
100.0000 mL | Freq: Once | INTRAMUSCULAR | Status: AC | PRN
  Administered 2020-05-01: 100 mL via INTRAVENOUS

## 2020-05-01 MED ORDER — KETOROLAC TROMETHAMINE 60 MG/2ML IM SOLN
15.0000 mg | Freq: Once | INTRAMUSCULAR | Status: AC
  Administered 2020-05-01: 15 mg via INTRAMUSCULAR
  Filled 2020-05-01: qty 2

## 2020-05-01 MED ORDER — MORPHINE SULFATE 15 MG PO TABS: 7.5000 mg | ORAL_TABLET | ORAL | 0 refills | Status: DC | PRN

## 2020-05-01 NOTE — ED Notes (Signed)
Pt. Stated, after going to [facility] her pain is no better.

## 2020-05-01 NOTE — Discharge Instructions (Addendum)
Take 4 over the counter ibuprofen tablets 3 times a day or 2 over-the-counter naproxen tablets twice a day for pain. Also take tylenol 1000mg (2 extra strength) four times a day.   Then take the pain medicine if you feel like you need it. Narcotics do not help with the pain, they only make you care about it less.  You can become addicted to this, people may break into your house to steal it.  It will constipate you.  If you drive under the influence of this medicine you can get a DUI.    Follow up with general surgery.  Return for worsening pain, fever, inability to eat or drink.

## 2020-05-01 NOTE — ED Notes (Signed)
Pt requested to lay down. EMT will get a recliner chair for her.

## 2020-05-01 NOTE — ED Triage Notes (Signed)
Pt. Stated, I was dx 2 weeks ago with hernia and now its strangulated hernia. I have 2 hernias ventricle and hernia. I was at my Dr.'s today. And told to come here.

## 2020-05-01 NOTE — ED Provider Notes (Signed)
[facility] EMERGENCY DEPARTMENT Provider Note   [name]: [id number] Arrival date & time: 05/01/20  1828     History Chief Complaint  Patient presents with  . Abdominal Pain    Tammy Decker is a 50 y.o. female.  50 yo F with a chief complaints of abdominal pain.  This been going on for some time but worse over the past couple weeks.  Described as excruciating for the past couple weeks.  Saw her gastroenterologist to obtain a CT scan today that was concerning for a hernia just above the umbilicus.  There was signs of incarceration but not strangulation.  Fat-containing only.  She denied any signs of bowel obstruction.  Was told to come to the ED for evaluation.  She denies fevers or chills.  Denies trauma.  The history is provided by the patient.  Illness Severity:  Moderate Onset quality:  Gradual Duration:  2 weeks Timing:  Constant Progression:  Worsening Chronicity:  New Associated symptoms: abdominal pain   Associated symptoms: no chest pain, no congestion, no fever, no headaches, no myalgias, no nausea, no rhinorrhea, no shortness of breath, no vomiting and no wheezing        Past Medical History:  Diagnosis Date  . Hypertension     There are no problems to display for this patient.   Past Surgical History:  Procedure Laterality Date  . BACK SURGERY    . NECK SURGERY    . uterine ablation       OB History   No obstetric history on file.     Family History  Problem Relation Age of Onset  . Diabetes Mother   . Diabetes Father     Social History   Tobacco Use  . Smoking status: Current Every Day Smoker    Packs/day: 0.15  . Smokeless tobacco: Never Used  . Tobacco comment: down to 3 cigs per day  Substance Use Topics  . Alcohol use: Yes    Comment: occasionally  . Drug use: Never    Home Medications Prior to Admission medications   Medication Sig Start Date End Date Taking? Authorizing Provider  albuterol (VENTOLIN  HFA) 108 (90 Base) MCG/ACT inhaler Inhale 1-2 puffs into the lungs every 6 (six) hours as needed for wheezing or shortness of breath. 02/15/20  Yes Charlott Holler, MD  fluticasone (FLONASE) 50 MCG/ACT nasal spray Place 1 spray into both nostrils daily. 02/15/20  Yes Charlott Holler, MD  losartan-hydrochlorothiazide (HYZAAR) 50-12.5 MG tablet Take 1 tablet by mouth daily. 04/17/20  Yes [provider]  metoprolol succinate (TOPROL-XL) 100 MG 24 hr tablet Take 100 mg by mouth 2 (two) times daily. 11/20/19  Yes [provider]  morphine (MSIR) 15 MG tablet Take 0.5 tablets (7.5 mg total) by mouth every 4 (four) hours as needed for severe pain. 05/01/20  Yes Melene Plan, DO  omeprazole (PRILOSEC OTC) 20 MG tablet Take 20 mg by mouth daily before breakfast.   Yes [provider]  budesonide-formoterol (SYMBICORT) 160-4.5 MCG/ACT inhaler Inhale 2 puffs into the lungs in the morning and at bedtime. Patient not taking: No sig reported 02/15/20   Charlott Holler, MD    Allergies    Ciprofloxacin  Review of Systems   Review of Systems  Constitutional: Negative for chills and fever.  HENT: Negative for congestion and rhinorrhea.   Eyes: Negative for redness and visual disturbance.  Respiratory: Negative for shortness of breath and wheezing.   Cardiovascular:  Negative for chest pain and palpitations.  Gastrointestinal: Positive for abdominal distention and abdominal pain. Negative for nausea and vomiting.  Genitourinary: Negative for dysuria and urgency.  Musculoskeletal: Negative for arthralgias and myalgias.  Skin: Negative for pallor and wound.  Neurological: Negative for dizziness and headaches.    Physical Exam Updated Vital Signs BP 117/83   Pulse 95   Temp 98.9 F (37.2 C) (Oral)   Resp (!) 21   SpO2 98%   Physical Exam Vitals and nursing note reviewed.  Constitutional:      General: She is not in acute distress.    Appearance: She is well-developed and  well-nourished. She is not diaphoretic.  HENT:     Head: Normocephalic and atraumatic.  Eyes:     Extraocular Movements: EOM normal.     Pupils: Pupils are equal, round, and reactive to light.  Cardiovascular:     Rate and Rhythm: Normal rate and regular rhythm.     Heart sounds: No murmur heard. No friction rub. No gallop.   Pulmonary:     Effort: Pulmonary effort is normal.     Breath sounds: No wheezing or rales.  Abdominal:     General: There is no distension.     Palpations: Abdomen is soft.     Tenderness: There is abdominal tenderness in the epigastric area and periumbilical area.  Musculoskeletal:        General: No tenderness or edema.     Cervical back: Normal range of motion and neck supple.  Skin:    General: Skin is warm and dry.  Neurological:     Mental Status: She is alert and oriented to person, place, and time.  Psychiatric:        Mood and Affect: Mood and affect normal.        Behavior: Behavior normal.     ED Results / Procedures / Treatments   Labs (all labs ordered are listed, but only abnormal results are displayed) Labs Reviewed  COMPREHENSIVE METABOLIC PANEL - Abnormal; Notable for the following components:      Result Value   Potassium 3.4 (*)    CO2 20 (*)    Glucose, Bld 155 (*)    BUN <5 (*)    AST 60 (*)    ALT 60 (*)    All other components within normal limits  CBC - Abnormal; Notable for the following components:   WBC 12.9 (*)    Hemoglobin 16.0 (*)    HCT 46.2 (*)    MCV 102.9 (*)    MCH 35.6 (*)    All other components within normal limits  URINALYSIS, ROUTINE W REFLEX MICROSCOPIC - Abnormal; Notable for the following components:   Color, Urine STRAW (*)    All other components within normal limits  LIPASE, BLOOD  LACTIC ACID, PLASMA    EKG None  Radiology CT ABDOMEN PELVIS W CONTRAST  Result Date: 05/01/2020 CLINICAL DATA:  Periumbilical pain.  Hernia. EXAM: CT ABDOMEN AND PELVIS WITH CONTRAST TECHNIQUE:  Multidetector CT imaging of the abdomen and pelvis was performed using the standard protocol following bolus administration of intravenous contrast. CONTRAST:  100mL OMNIPAQUE IOHEXOL 300 MG/ML  SOLN COMPARISON:  08/04/2019 FINDINGS: Lower chest: Chronic scarring or atelectasis within the right middle lobe. Included lung bases are otherwise clear. Heart size is normal. Hepatobiliary: Diffusely decreased attenuation of the hepatic parenchyma. No focal hepatic lesion is identified. Status post cholecystectomy. No biliary dilatation. Pancreas: Unremarkable. No pancreatic ductal dilatation or  surrounding inflammatory changes. Spleen: Normal in size without focal abnormality. Unchanged rounded soft tissue nodule anterior to the spleen measuring 1.4 cm, likely splenule. Adrenals/Urinary Tract: Unremarkable adrenal glands. Kidneys enhance symmetrically without focal lesion, stone, or hydronephrosis. Ureters are nondilated. Urinary bladder appears unremarkable. Stomach/Bowel: Stomach is within normal limits. Oral contrast is present within the small bowel and proximal colon. Appendix appears normal (series 2, image 69). No evidence of bowel wall thickening, distention, or inflammatory changes. Vascular/Lymphatic: No significant vascular findings are present. No enlarged abdominal or pelvic lymph nodes. Reproductive: Uterus and bilateral adnexa are unremarkable. Other: Small fat containing midline supraumbilical hernia with increased induration of the fat within the hernia sac (series 2, image 38). Hernia neck measures 11 mm in diameter. No fluid or bowel present within the hernia sac. Small fat containing umbilical hernia without focal abnormality. No free fluid. No abdominopelvic fluid collection. No pneumoperitoneum. Musculoskeletal: Spinal stimulator present with leads terminating at the T9-10 through T11-T12 levels. No acute osseous abnormality. No suspicious bony lesion. IMPRESSION: 1. Small fat-containing  supraumbilical hernia with increased induration of the fat within the hernia sac, suspicious for incarceration. 2. Small fat-containing umbilical hernia without focal abnormality. 3. Hepatic steatosis. Electronically Signed   By: Duanne Guess D.O.   On: 05/01/2020 16:12    Procedures Hernia reduction  Date/Time: 05/01/2020 11:07 PM Performed by: Melene Plan, DO Authorized by: Melene Plan, DO  Consent: Verbal consent obtained. Consent given by: patient Imaging studies: imaging studies available Required items: required blood products, implants, devices, and special equipment available Patient identity confirmed: verbally with patient Preparation: Patient was prepped and draped in the usual sterile fashion. Local anesthesia used: no  Anesthesia: Local anesthesia used: no  Sedation: Patient sedated: no  Patient tolerance: patient tolerated the procedure well with no immediate complications      Medications Ordered in ED Medications  fentaNYL (SUBLIMAZE) injection 50 mcg (50 mcg Nasal Given 05/01/20 1916)  ketorolac (TORADOL) 30 MG/ML injection 15 mg (0 mg Intravenous Hold 05/01/20 2243)  fentaNYL (SUBLIMAZE) injection 100 mcg (100 mcg Intravenous Given 05/01/20 2038)  HYDROmorphone (DILAUDID) injection 0.5 mg (0.5 mg Intravenous Given 05/01/20 2135)  ondansetron (ZOFRAN) injection 4 mg (4 mg Intravenous Given 05/01/20 2135)  ketorolac (TORADOL) injection 15 mg (15 mg Intramuscular Given 05/01/20 2241)    ED Course  I have reviewed the triage vital signs and the nursing notes.  Pertinent labs & imaging results that were available during my care of the patient were reviewed by me and considered in my medical decision making (see chart for details).    MDM Rules/Calculators/A&P                          50 yo F with a cc of abdominal pain.  This been going on for couple weeks.  Described as severe.  Saw her gastroenterologist who did a CAT scan that found that she had a likely  incarcerated hernia.  No signs of bottom bowel involvement.  Patient is significantly uncomfortable.  Difficulty reducing though difficult to appreciate due to body habitus.  Patient was given more pain medicine.  I discussed the case with Dr. Janee Morn, general surgery.  Felt it reasonable even if I was unable to reduce the hernia if her pain was controlled she could follow-up with surgery in the office.  Patient's pain is much better controlled on reassessment.  Prescribe her a course of pain medicine.  General surgery follow-up.  11:07  PM:  I have discussed the diagnosis/risks/treatment options with the patient and believe the pt to be eligible for discharge home to follow-up with Gen surgery. We also discussed returning to the ED immediately if new or worsening sx occur. We discussed the sx which are most concerning (e.g., sudden worsening pain, fever, inability to tolerate by mouth) that necessitate immediate return. Medications administered to the patient during their visit and any new prescriptions provided to the patient are listed below.  Medications given during this visit Medications  fentaNYL (SUBLIMAZE) injection 50 mcg (50 mcg Nasal Given 05/01/20 1916)  ketorolac (TORADOL) 30 MG/ML injection 15 mg (0 mg Intravenous Hold 05/01/20 2243)  fentaNYL (SUBLIMAZE) injection 100 mcg (100 mcg Intravenous Given 05/01/20 2038)  HYDROmorphone (DILAUDID) injection 0.5 mg (0.5 mg Intravenous Given 05/01/20 2135)  ondansetron (ZOFRAN) injection 4 mg (4 mg Intravenous Given 05/01/20 2135)  ketorolac (TORADOL) injection 15 mg (15 mg Intramuscular Given 05/01/20 2241)     The patient appears reasonably screen and/or stabilized for discharge and I doubt any other medical condition or other [facility] requiring further screening, evaluation, or treatment in the ED at this time prior to discharge.   Final Clinical Impression(s) / ED Diagnoses Final diagnoses:  Incarcerated hernia    Rx / DC Orders ED Discharge  Orders         Ordered    morphine (MSIR) 15 MG tablet  Every 4 hours PRN        05/01/20 2229           Melene Plan, DO 05/01/20 2308

## 2020-05-03 ENCOUNTER — Ambulatory Visit (INDEPENDENT_AMBULATORY_CARE_PROVIDER_SITE_OTHER): Payer: No Typology Code available for payment source | Admitting: Surgery

## 2020-05-03 ENCOUNTER — Encounter: Payer: Self-pay | Admitting: Surgery

## 2020-05-03 ENCOUNTER — Telehealth: Payer: Self-pay | Admitting: Surgery

## 2020-05-03 ENCOUNTER — Other Ambulatory Visit: Payer: Self-pay

## 2020-05-03 VITALS — BP 122/79 | HR 101 | Temp 98.1°F | Ht 67.0 in | Wt 233.2 lb

## 2020-05-03 DIAGNOSIS — K430 Incisional hernia with obstruction, without gangrene: Secondary | ICD-10-CM

## 2020-05-03 NOTE — Telephone Encounter (Signed)
Patient has been advised of Pre-Admission date/time, COVID Testing date and Surgery date.  Surgery Date: 05/09/20 Preadmission Testing Date: 05/05/20 (phone 8a-1p) Covid Testing Date: 05/05/20 - patient advised to [facility] to [facility] ([address] [facility]) between 8a-1p  Patient has been made aware to call [phone], between 1-3:00pm the day before surgery, to find out what time to arrive for surgery.

## 2020-05-03 NOTE — Progress Notes (Signed)
Patient ID: Tammy Decker, female   DOB: [birth year], 50 y.o.   MRN: [id number]  HPI Tammy Decker is a 50 y.o. female seen in consultation at the request of Dr. Adela Lank for incisional hernia.  She stated that few days ago started having moderate to severe abdominal pain located in the epigastric area.  Symptoms seem to be worsening with Valsalva movements.  No fevers no chills.  She reports crescendo symptoms for the last few weeks.  She did have a history of laparoscopic cholecystectomy several years ago at an outside facility.  She also had a history of back surgery both cervical and lumbar..  She does not take narcotics as an outpatient. She is able to perform more than four METS of activity without any shortness or chest pain.  She did have a recent CT scan of the abdomen pelvis that I personally reviewed showing evidence of an incarcerated ventral hernia with an additional umbilical hernia.  There is no evidence of bowel compromise.  CBC shows mild increase in white count of 12.9 and hemoglobin of 16. CMP was normal except mild hypokalemia.   HPI  Past Medical History:  Diagnosis Date  . Hypertension   . Tachycardia 2020    Past Surgical History:  Procedure Laterality Date  . BACK SURGERY    . CHOLECYSTECTOMY    . NECK SURGERY    . uterine ablation      Family History  Problem Relation Age of Onset  . Diabetes Mother   . Diabetes Father     Social History Social History   Tobacco Use  . Smoking status: Current Every Day Smoker    Packs/day: 0.15  . Smokeless tobacco: Never Used  . Tobacco comment: down to 3 cigs per day  Substance Use Topics  . Alcohol use: Yes    Comment: occasionally  . Drug use: Never    Allergies  Allergen Reactions  . Ciprofloxacin Anaphylaxis    Current Outpatient Medications  Medication Sig Dispense Refill  . albuterol (VENTOLIN HFA) 108 (90 Base) MCG/ACT inhaler Inhale 1-2 puffs into the lungs every 6 (six) hours as  needed for wheezing or shortness of breath. 1 each 11  . fluticasone (FLONASE) 50 MCG/ACT nasal spray Place 1 spray into both nostrils daily. 9.9 g 5  . losartan-hydrochlorothiazide (HYZAAR) 50-12.5 MG tablet Take 1 tablet by mouth daily.    . metoprolol succinate (TOPROL-XL) 100 MG 24 hr tablet Take 100 mg by mouth 2 (two) times daily.    Marland Kitchen morphine (MSIR) 15 MG tablet Take 0.5 tablets (7.5 mg total) by mouth every 4 (four) hours as needed for severe pain. 7 tablet 0  . omeprazole (PRILOSEC OTC) 20 MG tablet Take 20 mg by mouth daily before breakfast.     No current facility-administered medications for this visit.     Review of Systems Full ROS  was asked and was negative except for the information on the HPI  Physical Exam Blood pressure 122/79, pulse (!) 101, temperature 98.1 F (36.7 C), temperature source Oral, height 5\' 7"  (1.702 m), weight 233 lb 3.2 oz (105.8 kg), SpO2 95 %. CONSTITUTIONAL: NAD. EYES: Pupils are equal, round, Sclera are non-icteric. EARS, NOSE, MOUTH AND THROAT: She is wearing a mask. Hearing is intact to voice. LYMPH NODES:  Lymph nodes in the neck are normal. RESPIRATORY:  Lungs are clear. There is normal respiratory effort, with equal breath sounds bilaterally, and without pathologic use of accessory muscles. CARDIOVASCULAR: Heart is regular  without murmurs, gallops, or rubs. GI: The abdomen is soft, there is evidence of incarcerated supraumbilical incisional hernia.  There is no evidence of peritonitis.  Is very tender to palpation.  He does have a component of some diastases recti as well.  GU: Rectal deferred.   MUSCULOSKELETAL: Normal muscle strength and tone. No cyanosis or edema.   SKIN: Turgor is good and there are no pathologic skin lesions or ulcers. NEUROLOGIC: Motor and sensation is grossly normal. Cranial nerves are grossly intact. PSYCH:  Oriented to person, place and time. Affect is normal.  Data Reviewed  I have personally reviewed the  patient's imaging, laboratory findings and medical records.    Assessment/Plan 50-year-old female with incarcerated ventral hernia containing omentum and umbilical hernia.  She is obviously symptomatic.  Definitely recommend repair.  I do not think we have much room for optimization of weight given the crescendo symptoms.  I do think she would be a good candidate for robotic approach.  Procedure discussed with patient detail.  Risks, benefits and possible patient Fluolar unable to comprehend, infection, bowel injuries, chronic pain and recurrence.  She understands and wished to proceed we will tentatively schedule her for next week. A copy of this report was sent to the referring provider  Tacuma Graffam, MD FACS General Surgeon 05/03/2020, 10:22 AM   

## 2020-05-03 NOTE — Patient Instructions (Addendum)
Our surgery scheduler Britta Mccreedy will call you within 24-48 hours to get you scheduled. If you have not heard from her after 48 hours, please call our office. You will need to get Covid tested before surgery and have the blue sheet available when she calls to write down important information. If you have any concerns or questions, please feel free to call our office.     Hernia, Adult     A hernia happens when tissue inside your body pushes out through a weak spot in your belly muscles (abdominal wall). This makes a round lump (bulge). The lump may be:  In a scar from surgery that was done in your belly (incisional hernia).  Near your belly button (umbilical hernia).  In your groin (inguinal hernia). Your groin is the area where your leg meets your lower belly (abdomen). This kind of hernia could also be: ? In your scrotum, if you are female. ? In folds of skin around your vagina, if you are female.  In your upper thigh (femoral hernia).  Inside your belly (hiatal hernia). This happens when your stomach slides above the muscle between your belly and your chest (diaphragm). If your hernia is small and it does not cause pain, you may not need treatment. If your hernia is large or it causes pain, you may need surgery. Follow these instructions at home: Activity  Avoid stretching or overusing (straining) the muscles near your hernia. Straining can happen when you: ? Lift something heavy. ? Poop (have a bowel movement).  Do not lift anything that is heavier than 10 lb (4.5 kg), or the limit that you are told, until your doctor says that it is safe.  Use the strength of your legs when you lift something heavy. Do not use only your back muscles to lift. General instructions  Do these things if told by your doctor so you do not have trouble pooping (constipation): ? Drink enough fluid to keep your pee (urine) pale yellow. ? Eat foods that are high in fiber. These include fresh fruits and  vegetables, whole grains, and beans. ? Limit foods that are high in fat and processed sugars. These include foods that are fried or sweet. ? Take medicine for trouble pooping.  When you cough, try to cough gently.  You may try to push your hernia in by very gently pressing on it when you are lying down. Do not try to force the bulge back in if it will not push in easily.  If you are overweight, work with your doctor to lose weight safely.  Do not use any products that have nicotine or tobacco in them. These include cigarettes and e-cigarettes. If you need help quitting, ask your doctor.  If you will be having surgery (hernia repair), watch your hernia for changes in shape, size, or color. Tell your doctor if you see any changes.  Take over-the-counter and prescription medicines only as told by your doctor.  Keep all follow-up visits as told by your doctor. Contact a doctor if:  You get new pain, swelling, or redness near your hernia.  You poop fewer times in a week than normal.  You have trouble pooping.  You have poop (stool) that is more dry than normal.  You have poop that is harder or larger than normal. Get help right away if:  You have a fever.  You have belly pain that gets worse.  You feel sick to your stomach (nauseous).  You throw up (vomit).  Your hernia cannot be pushed in by very gently pressing on it when you are lying down. Do not try to force the bulge back in if it will not push in easily.  Your hernia: ? Changes in shape or size. ? Changes color. ? Feels hard or it hurts when you touch it. These symptoms may represent a serious problem that is an emergency. Do not wait to see if the symptoms will go away. Get medical help right away. Call your local emergency services (911 in the [place] Summary  A hernia happens when tissue inside your body pushes out through a weak spot in the belly muscles. This creates a bulge.  If your hernia is small and it does  not hurt, you may not need treatment. If your hernia is large or it hurts, you may need surgery.  If you will be having surgery, watch your hernia for changes in shape, size, or color. Tell your doctor about any changes. This information is not intended to replace advice given to you by your health care provider. Make sure you discuss any questions you have with your health care provider. Document Revised: 06/11/2018 Document Reviewed: 11/20/2016 Elsevier Patient Education  2021 [employer].

## 2020-05-03 NOTE — H&P (View-Only) (Signed)
Patient ID: Tammy Decker, female   DOB: [birth year], 50 y.o.   MRN: [id number]  HPI Tammy Decker is a 50 y.o. female seen in consultation at the request of Dr. Adela Lank for incisional hernia.  She stated that few days ago started having moderate to severe abdominal pain located in the epigastric area.  Symptoms seem to be worsening with Valsalva movements.  No fevers no chills.  She reports crescendo symptoms for the last few weeks.  She did have a history of laparoscopic cholecystectomy several years ago at an outside facility.  She also had a history of back surgery both cervical and lumbar..  She does not take narcotics as an outpatient. She is able to perform more than four METS of activity without any shortness or chest pain.  She did have a recent CT scan of the abdomen pelvis that I personally reviewed showing evidence of an incarcerated ventral hernia with an additional umbilical hernia.  There is no evidence of bowel compromise.  CBC shows mild increase in white count of 12.9 and hemoglobin of 16. CMP was normal except mild hypokalemia.   HPI  Past Medical History:  Diagnosis Date  . Hypertension   . Tachycardia 2020    Past Surgical History:  Procedure Laterality Date  . BACK SURGERY    . CHOLECYSTECTOMY    . NECK SURGERY    . uterine ablation      Family History  Problem Relation Age of Onset  . Diabetes Mother   . Diabetes Father     Social History Social History   Tobacco Use  . Smoking status: Current Every Day Smoker    Packs/day: 0.15  . Smokeless tobacco: Never Used  . Tobacco comment: down to 3 cigs per day  Substance Use Topics  . Alcohol use: Yes    Comment: occasionally  . Drug use: Never    Allergies  Allergen Reactions  . Ciprofloxacin Anaphylaxis    Current Outpatient Medications  Medication Sig Dispense Refill  . albuterol (VENTOLIN HFA) 108 (90 Base) MCG/ACT inhaler Inhale 1-2 puffs into the lungs every 6 (six) hours as  needed for wheezing or shortness of breath. 1 each 11  . fluticasone (FLONASE) 50 MCG/ACT nasal spray Place 1 spray into both nostrils daily. 9.9 g 5  . losartan-hydrochlorothiazide (HYZAAR) 50-12.5 MG tablet Take 1 tablet by mouth daily.    . metoprolol succinate (TOPROL-XL) 100 MG 24 hr tablet Take 100 mg by mouth 2 (two) times daily.    Marland Kitchen morphine (MSIR) 15 MG tablet Take 0.5 tablets (7.5 mg total) by mouth every 4 (four) hours as needed for severe pain. 7 tablet 0  . omeprazole (PRILOSEC OTC) 20 MG tablet Take 20 mg by mouth daily before breakfast.     No current facility-administered medications for this visit.     Review of Systems Full ROS  was asked and was negative except for the information on the HPI  Physical Exam Blood pressure 122/79, pulse (!) 101, temperature 98.1 F (36.7 C), temperature source Oral, height 5\' 7"  (1.702 m), weight 233 lb 3.2 oz (105.8 kg), SpO2 95 %. CONSTITUTIONAL: NAD. EYES: Pupils are equal, round, Sclera are non-icteric. EARS, NOSE, MOUTH AND THROAT: She is wearing a mask. Hearing is intact to voice. LYMPH NODES:  Lymph nodes in the neck are normal. RESPIRATORY:  Lungs are clear. There is normal respiratory effort, with equal breath sounds bilaterally, and without pathologic use of accessory muscles. CARDIOVASCULAR: Heart is regular  without murmurs, gallops, or rubs. GI: The abdomen is soft, there is evidence of incarcerated supraumbilical incisional hernia.  There is no evidence of peritonitis.  Is very tender to palpation.  He does have a component of some diastases recti as well.  GU: Rectal deferred.   MUSCULOSKELETAL: Normal muscle strength and tone. No cyanosis or edema.   SKIN: Turgor is good and there are no pathologic skin lesions or ulcers. NEUROLOGIC: Motor and sensation is grossly normal. Cranial nerves are grossly intact. PSYCH:  Oriented to person, place and time. Affect is normal.  Data Reviewed  I have personally reviewed the  patient's imaging, laboratory findings and medical records.    Assessment/Plan 50 year old female with incarcerated ventral hernia containing omentum and umbilical hernia.  She is obviously symptomatic.  Definitely recommend repair.  I do not think we have much room for optimization of weight given the crescendo symptoms.  I do think she would be a good candidate for robotic approach.  Procedure discussed with patient detail.  Risks, benefits and possible patient Fluolar unable to comprehend, infection, bowel injuries, chronic pain and recurrence.  She understands and wished to proceed we will tentatively schedule her for next week. A copy of this report was sent to the referring provider  Sterling Big, MD FACS General Surgeon 05/03/2020, 10:22 AM

## 2020-05-05 ENCOUNTER — Encounter
Admission: RE | Admit: 2020-05-05 | Discharge: 2020-05-05 | Disposition: A | Discharge status: Home / Self Care | Payer: No Typology Code available for payment source | Source: Ambulatory Visit | Attending: Surgery | Admitting: Surgery

## 2020-05-05 ENCOUNTER — Other Ambulatory Visit
Admission: RE | Admit: 2020-05-05 | Discharge: 2020-05-05 | Disposition: A | Discharge status: Home / Self Care | Payer: No Typology Code available for payment source | Source: Ambulatory Visit | Attending: Surgery | Admitting: Surgery

## 2020-05-05 ENCOUNTER — Other Ambulatory Visit: Payer: Self-pay

## 2020-05-05 DIAGNOSIS — Z01812 Encounter for preprocedural laboratory examination: Secondary | ICD-10-CM | POA: Insufficient documentation

## 2020-05-05 DIAGNOSIS — Z20822 Contact with and (suspected) exposure to covid-19: Secondary | ICD-10-CM | POA: Insufficient documentation

## 2020-05-05 HISTORY — DX: Other complications of anesthesia, initial encounter: T88.59XA

## 2020-05-05 HISTORY — DX: Unspecified asthma, uncomplicated: J45.909

## 2020-05-05 HISTORY — DX: Gastro-esophageal reflux disease without esophagitis: K21.9

## 2020-05-05 NOTE — Patient Instructions (Addendum)
Your procedure is scheduled on: May 09, 2020 TUESDAY Report to the Registration Desk on the 1st floor of the [facility] To find out your arrival time, please call [phone] between 1PM - 3PM on: Monday May 08, 2020  REMEMBER: Instructions that are not followed completely may result in serious medical risk, up to and including death; or upon the discretion of your surgeon and anesthesiologist your surgery may need to be rescheduled.  Do not eat food after midnight the night before surgery.  No gum chewing, lozengers or hard candies.  You may however, drink CLEAR liquids up to 2 hours before you are scheduled to arrive for your surgery. Do not drink anything within 2 hours of your scheduled arrival time.  Clear liquids include: - water  - apple juice without pulp - gatorade (not RED, PURPLE, OR BLUE) - black coffee or tea (Do NOT add milk or creamers to the coffee or tea) Do NOT drink anything that is not on this list.  Type 1 and Type 2 diabetics should only drink water.    TAKE THESE MEDICATIONS THE MORNING OF SURGERY WITH A SIP OF WATER: METOPROLOL OMEPRAZOLE (take one the night before and one on the morning of surgery - helps to prevent nausea after surgery.)  DO NOT TAKE LOSARTAN-HYDROCHLOROTHIAZIDE  Use inhalers on the day of surgery AND FLONASE  One week prior to surgery: Stop Anti-inflammatories (NSAIDS) such as Advil, Aleve, Ibuprofen, Motrin, Naproxen, Naprosyn and ASPIRIN OR Aspirin based products such as Excedrin, Goodys Powder, BC Powder. Stop ANY OVER THE COUNTER supplements until after surgery.  No Alcohol for 24 hours before or after surgery.  No Smoking including e-cigarettes for 24 hours prior to surgery.  No chewable tobacco products for at least 6 hours prior to surgery.  No nicotine patches on the day of surgery.  Do not use any "recreational" drugs for at least a week prior to your surgery.  Please be advised that the combination of cocaine and  anesthesia may have negative outcomes, up to and including death. If you test positive for cocaine, your surgery will be cancelled.  On the morning of surgery brush your teeth with toothpaste and water, you may rinse your mouth with mouthwash if you wish. Do not swallow any toothpaste or mouthwash.  Do not wear jewelry, make-up, hairpins, clips or nail polish.  Do not wear lotions, powders, or perfumes.   Do not shave body from the neck down 48 hours prior to surgery just in case you cut yourself which could leave a site for infection.  Also, freshly shaved skin may become irritated if using the CHG soap.  Contact lenses, hearing aids and dentures may not be worn into surgery.  Do not bring valuables to the hospital. [facility] is not responsible for any missing/lost belongings or valuables.   Use CHG Soap  as directed on instruction sheet.  Notify your doctor if there is any change in your medical condition (cold, fever, infection).  Wear comfortable clothing (specific to your surgery type) to the hospital.  Plan for stool softeners for home use; pain medications have a tendency to cause constipation. You can also help prevent constipation by eating foods high in fiber such as fruits and vegetables and drinking plenty of fluids as your diet allows.  After surgery, you can help prevent lung complications by doing breathing exercises.  Take deep breaths and cough every 1-2 hours. Your doctor may order a device called an Facilities manager to  help you take deep breaths. When coughing or sneezing, hold a pillow firmly against your incision with both hands. This is called "splinting." Doing this helps protect your incision. It also decreases belly discomfort.  If you are being discharged the day of surgery, you will not be allowed to drive home. You will need a responsible adult (18 years or older) to drive you home and stay with you that night.   Please call the Pre-admissions  Testing Dept. at [phone] if you have any questions about these instructions.  Surgery Visitation Policy:  Patients undergoing a surgery or procedure may have one family member or support person with them as long as that person is not COVID-19 positive or experiencing its symptoms.  That person may remain in the waiting area during the procedure.  Inpatient Visitation:    Visiting hours are 7 a.m. to 8 p.m. Inpatients will be allowed two visitors daily. The visitors may change each day during the patient's stay. No visitors under the age of 5. Any visitor under the age of 64 must be accompanied by an adult. The visitor must pass COVID-19 screenings, use hand sanitizer when entering and exiting the patient's room and wear a mask at all times, including in the patient's room. Patients must also wear a mask when staff or their visitor are in the room. Masking is required regardless of vaccination status.

## 2020-05-06 LAB — SARS CORONAVIRUS 2 (TAT 6-24 HRS): SARS Coronavirus 2: NEGATIVE

## 2020-05-09 ENCOUNTER — Ambulatory Visit: Payer: No Typology Code available for payment source | Admitting: Certified Registered"

## 2020-05-09 ENCOUNTER — Other Ambulatory Visit: Payer: Self-pay

## 2020-05-09 ENCOUNTER — Encounter: Payer: Self-pay | Admitting: Surgery

## 2020-05-09 ENCOUNTER — Encounter
Admission: RE | Disposition: A | Discharge status: Home / Self Care | Payer: Self-pay | Source: Home / Self Care | Attending: Surgery

## 2020-05-09 ENCOUNTER — Ambulatory Visit
Admission: RE | Admit: 2020-05-09 | Discharge: 2020-05-09 | Disposition: A | Discharge status: Home / Self Care | Payer: No Typology Code available for payment source | Attending: Surgery | Admitting: Surgery

## 2020-05-09 DIAGNOSIS — K436 Other and unspecified ventral hernia with obstruction, without gangrene: Secondary | ICD-10-CM | POA: Diagnosis not present

## 2020-05-09 DIAGNOSIS — F17210 Nicotine dependence, cigarettes, uncomplicated: Secondary | ICD-10-CM | POA: Insufficient documentation

## 2020-05-09 DIAGNOSIS — Z87892 Personal history of anaphylaxis: Secondary | ICD-10-CM | POA: Insufficient documentation

## 2020-05-09 DIAGNOSIS — Z79899 Other long term (current) drug therapy: Secondary | ICD-10-CM | POA: Diagnosis not present

## 2020-05-09 DIAGNOSIS — K420 Umbilical hernia with obstruction, without gangrene: Secondary | ICD-10-CM | POA: Diagnosis not present

## 2020-05-09 DIAGNOSIS — Z881 Allergy status to other antibiotic agents status: Secondary | ICD-10-CM | POA: Diagnosis not present

## 2020-05-09 DIAGNOSIS — K430 Incisional hernia with obstruction, without gangrene: Secondary | ICD-10-CM

## 2020-05-09 HISTORY — PX: XI ROBOTIC ASSISTED VENTRAL HERNIA: SHX6789

## 2020-05-09 LAB — POCT PREGNANCY, URINE: Preg Test, Ur: NEGATIVE

## 2020-05-09 SURGERY — REPAIR, HERNIA, VENTRAL, ROBOT-ASSISTED
Anesthesia: General

## 2020-05-09 MED ORDER — CHLORHEXIDINE GLUCONATE CLOTH 2 % EX PADS
6.0000 | MEDICATED_PAD | Freq: Once | CUTANEOUS | Status: AC
  Administered 2020-05-09: 6 via TOPICAL

## 2020-05-09 MED ORDER — DEXAMETHASONE SODIUM PHOSPHATE 10 MG/ML IJ SOLN
INTRAMUSCULAR | Status: AC
  Filled 2020-05-09: qty 1

## 2020-05-09 MED ORDER — ONDANSETRON HCL 4 MG/2ML IJ SOLN
INTRAMUSCULAR | Status: AC
  Filled 2020-05-09: qty 2

## 2020-05-09 MED ORDER — CEFAZOLIN SODIUM-DEXTROSE 2-4 GM/100ML-% IV SOLN
2.0000 g | INTRAVENOUS | Status: AC
  Administered 2020-05-09: 2 g via INTRAVENOUS

## 2020-05-09 MED ORDER — PROMETHAZINE HCL 25 MG/ML IJ SOLN: 6.2500 mg | INTRAMUSCULAR | Status: DC | PRN

## 2020-05-09 MED ORDER — ACETAMINOPHEN 500 MG PO TABS: 1000.0000 mg | ORAL_TABLET | ORAL | Status: AC

## 2020-05-09 MED ORDER — CEFAZOLIN SODIUM-DEXTROSE 2-4 GM/100ML-% IV SOLN
INTRAVENOUS | Status: AC
  Filled 2020-05-09: qty 100

## 2020-05-09 MED ORDER — SUCCINYLCHOLINE CHLORIDE 200 MG/10ML IV SOSY
PREFILLED_SYRINGE | INTRAVENOUS | Status: AC
  Filled 2020-05-09: qty 10

## 2020-05-09 MED ORDER — ORAL CARE MOUTH RINSE: 15.0000 mL | Freq: Once | OROMUCOSAL | Status: AC

## 2020-05-09 MED ORDER — GABAPENTIN 300 MG PO CAPS: 300.0000 mg | ORAL_CAPSULE | ORAL | Status: AC

## 2020-05-09 MED ORDER — DEXMEDETOMIDINE HCL 200 MCG/2ML IV SOLN
INTRAVENOUS | Status: DC | PRN
  Administered 2020-05-09: 12 ug via INTRAVENOUS

## 2020-05-09 MED ORDER — FENTANYL CITRATE (PF) 100 MCG/2ML IJ SOLN
INTRAMUSCULAR | Status: DC | PRN
  Administered 2020-05-09: 50 ug via INTRAVENOUS
  Administered 2020-05-09: 100 ug via INTRAVENOUS
  Administered 2020-05-09 (×2): 50 ug via INTRAVENOUS

## 2020-05-09 MED ORDER — FENTANYL CITRATE (PF) 100 MCG/2ML IJ SOLN
INTRAMUSCULAR | Status: AC
  Administered 2020-05-09: 25 ug via INTRAVENOUS
  Filled 2020-05-09: qty 2

## 2020-05-09 MED ORDER — FENTANYL CITRATE (PF) 100 MCG/2ML IJ SOLN
25.0000 ug | INTRAMUSCULAR | Status: DC | PRN
Start: 2020-05-09 — End: 2020-05-09

## 2020-05-09 MED ORDER — HYDROMORPHONE HCL 1 MG/ML IJ SOLN
INTRAMUSCULAR | Status: DC | PRN
  Administered 2020-05-09: 1 mg via INTRAVENOUS

## 2020-05-09 MED ORDER — BUPIVACAINE LIPOSOME 1.3 % IJ SUSP
INTRAMUSCULAR | Status: AC
  Filled 2020-05-09: qty 20

## 2020-05-09 MED ORDER — PHENYLEPHRINE HCL (PRESSORS) 10 MG/ML IV SOLN
INTRAVENOUS | Status: AC
  Filled 2020-05-09: qty 1

## 2020-05-09 MED ORDER — IPRATROPIUM-ALBUTEROL 0.5-2.5 (3) MG/3ML IN SOLN: 3.0000 mL | Freq: Once | RESPIRATORY_TRACT | Status: AC

## 2020-05-09 MED ORDER — PROPOFOL 10 MG/ML IV BOLUS
INTRAVENOUS | Status: DC | PRN
  Administered 2020-05-09: 200 mg via INTRAVENOUS

## 2020-05-09 MED ORDER — ROCURONIUM BROMIDE 10 MG/ML (PF) SYRINGE
PREFILLED_SYRINGE | INTRAVENOUS | Status: AC
  Filled 2020-05-09: qty 10

## 2020-05-09 MED ORDER — GABAPENTIN 300 MG PO CAPS
ORAL_CAPSULE | ORAL | Status: AC
  Administered 2020-05-09: 300 mg via ORAL
  Filled 2020-05-09: qty 1

## 2020-05-09 MED ORDER — ONDANSETRON HCL 4 MG/2ML IJ SOLN
4.0000 mg | Freq: Once | INTRAMUSCULAR | Status: AC
  Administered 2020-05-09: 4 mg via INTRAVENOUS

## 2020-05-09 MED ORDER — CELECOXIB 200 MG PO CAPS
ORAL_CAPSULE | ORAL | Status: AC
  Administered 2020-05-09: 200 mg via ORAL
  Filled 2020-05-09: qty 1

## 2020-05-09 MED ORDER — SUGAMMADEX SODIUM 200 MG/2ML IV SOLN
INTRAVENOUS | Status: DC | PRN
  Administered 2020-05-09: 200 mg via INTRAVENOUS

## 2020-05-09 MED ORDER — PHENYLEPHRINE HCL (PRESSORS) 10 MG/ML IV SOLN
INTRAVENOUS | Status: DC | PRN
  Administered 2020-05-09 (×3): 100 ug via INTRAVENOUS

## 2020-05-09 MED ORDER — LACTATED RINGERS IV SOLN: INTRAVENOUS | Status: DC

## 2020-05-09 MED ORDER — HYDROCODONE-ACETAMINOPHEN 5-325 MG PO TABS: 1.0000 | ORAL_TABLET | ORAL | 0 refills | Status: DC | PRN

## 2020-05-09 MED ORDER — LIDOCAINE HCL (CARDIAC) PF 100 MG/5ML IV SOSY
PREFILLED_SYRINGE | INTRAVENOUS | Status: DC | PRN
  Administered 2020-05-09: 100 mg via INTRAVENOUS

## 2020-05-09 MED ORDER — FENTANYL CITRATE (PF) 100 MCG/2ML IJ SOLN
25.0000 ug | INTRAMUSCULAR | Status: AC | PRN
Start: 2020-05-09 — End: 2020-05-09
  Administered 2020-05-09 (×4): 25 ug via INTRAVENOUS

## 2020-05-09 MED ORDER — BUPIVACAINE LIPOSOME 1.3 % IJ SUSP: 20.0000 mL | Freq: Once | INTRAMUSCULAR | Status: DC

## 2020-05-09 MED ORDER — FENTANYL CITRATE (PF) 250 MCG/5ML IJ SOLN
INTRAMUSCULAR | Status: AC
  Filled 2020-05-09: qty 5

## 2020-05-09 MED ORDER — OXYCODONE HCL 5 MG PO TABS: 5.0000 mg | ORAL_TABLET | Freq: Once | ORAL | Status: AC

## 2020-05-09 MED ORDER — KETAMINE HCL 50 MG/5ML IJ SOSY
PREFILLED_SYRINGE | INTRAMUSCULAR | Status: AC
  Filled 2020-05-09: qty 5

## 2020-05-09 MED ORDER — FENTANYL CITRATE (PF) 100 MCG/2ML IJ SOLN
25.0000 ug | INTRAMUSCULAR | Status: AC | PRN
  Administered 2020-05-09 (×2): 25 ug via INTRAVENOUS

## 2020-05-09 MED ORDER — KETOROLAC TROMETHAMINE 30 MG/ML IJ SOLN
INTRAMUSCULAR | Status: AC
  Filled 2020-05-09: qty 1

## 2020-05-09 MED ORDER — OXYCODONE HCL 5 MG PO TABS
ORAL_TABLET | ORAL | Status: AC
  Administered 2020-05-09: 5 mg via ORAL
  Filled 2020-05-09: qty 1

## 2020-05-09 MED ORDER — ACETAMINOPHEN 500 MG PO TABS
ORAL_TABLET | ORAL | Status: AC
  Administered 2020-05-09: 1000 mg via ORAL
  Filled 2020-05-09: qty 2

## 2020-05-09 MED ORDER — MIDAZOLAM HCL 2 MG/2ML IJ SOLN
INTRAMUSCULAR | Status: AC
  Filled 2020-05-09: qty 2

## 2020-05-09 MED ORDER — ROCURONIUM BROMIDE 100 MG/10ML IV SOLN
INTRAVENOUS | Status: DC | PRN
  Administered 2020-05-09 (×2): 20 mg via INTRAVENOUS
  Administered 2020-05-09: 40 mg via INTRAVENOUS

## 2020-05-09 MED ORDER — CHLORHEXIDINE GLUCONATE 0.12 % MT SOLN: 15.0000 mL | Freq: Once | OROMUCOSAL | Status: AC

## 2020-05-09 MED ORDER — SUCCINYLCHOLINE CHLORIDE 20 MG/ML IJ SOLN
INTRAMUSCULAR | Status: DC | PRN
  Administered 2020-05-09: 100 mg via INTRAVENOUS

## 2020-05-09 MED ORDER — PROPOFOL 10 MG/ML IV BOLUS
INTRAVENOUS | Status: AC
  Filled 2020-05-09: qty 20

## 2020-05-09 MED ORDER — HYDROMORPHONE HCL 1 MG/ML IJ SOLN
INTRAMUSCULAR | Status: AC
  Filled 2020-05-09: qty 1

## 2020-05-09 MED ORDER — ONDANSETRON HCL 4 MG/2ML IJ SOLN
INTRAMUSCULAR | Status: DC | PRN
  Administered 2020-05-09: 4 mg via INTRAVENOUS

## 2020-05-09 MED ORDER — BUPIVACAINE-EPINEPHRINE (PF) 0.25% -1:200000 IJ SOLN
INTRAMUSCULAR | Status: AC
  Filled 2020-05-09: qty 30

## 2020-05-09 MED ORDER — KETAMINE HCL 10 MG/ML IJ SOLN
INTRAMUSCULAR | Status: DC | PRN
  Administered 2020-05-09 (×2): 25 mg via INTRAVENOUS

## 2020-05-09 MED ORDER — IPRATROPIUM-ALBUTEROL 0.5-2.5 (3) MG/3ML IN SOLN
RESPIRATORY_TRACT | Status: AC
  Administered 2020-05-09: 3 mL via RESPIRATORY_TRACT
  Filled 2020-05-09: qty 3

## 2020-05-09 MED ORDER — BUPIVACAINE-EPINEPHRINE 0.25% -1:200000 IJ SOLN
INTRAMUSCULAR | Status: DC | PRN
  Administered 2020-05-09: 30 mL

## 2020-05-09 MED ORDER — DEXAMETHASONE SODIUM PHOSPHATE 10 MG/ML IJ SOLN
INTRAMUSCULAR | Status: DC | PRN
  Administered 2020-05-09: 5 mg via INTRAVENOUS

## 2020-05-09 MED ORDER — CHLORHEXIDINE GLUCONATE 0.12 % MT SOLN
OROMUCOSAL | Status: AC
  Administered 2020-05-09: 15 mL via OROMUCOSAL
  Filled 2020-05-09: qty 15

## 2020-05-09 MED ORDER — CELECOXIB 200 MG PO CAPS: 200.0000 mg | ORAL_CAPSULE | ORAL | Status: AC

## 2020-05-09 MED ORDER — MIDAZOLAM HCL 2 MG/2ML IJ SOLN
INTRAMUSCULAR | Status: DC | PRN
  Administered 2020-05-09: 2 mg via INTRAVENOUS

## 2020-05-09 SURGICAL SUPPLY — 55 items
"PENCIL ELECTRO HAND CTR " (MISCELLANEOUS) ×1 IMPLANT
ADH SKN CLS APL DERMABOND .7 (GAUZE/BANDAGES/DRESSINGS) ×1
APL PRP STRL LF DISP 70% ISPRP (MISCELLANEOUS) ×1
CANISTER SUCT 1200ML W/VALVE (MISCELLANEOUS) ×1 IMPLANT
CANNULA REDUC XI 12-8 STAPL (CANNULA) ×1
CANNULA REDUCER 12-8 DVNC XI (CANNULA) ×1 IMPLANT
CHLORAPREP W/TINT 26 (MISCELLANEOUS) ×2 IMPLANT
COVER TIP SHEARS 8 DVNC (MISCELLANEOUS) ×1 IMPLANT
COVER TIP SHEARS 8MM DA VINCI (MISCELLANEOUS) ×1
COVER WAND RF STERILE (DRAPES) ×2 IMPLANT
DEFOGGER SCOPE WARMER CLEARIFY (MISCELLANEOUS) ×2 IMPLANT
DERMABOND ADVANCED (GAUZE/BANDAGES/DRESSINGS) ×1
DERMABOND ADVANCED .7 DNX12 (GAUZE/BANDAGES/DRESSINGS) ×1 IMPLANT
DRAPE 3/4 80X56 (DRAPES) ×1 IMPLANT
DRAPE ARM DVNC X/XI (DISPOSABLE) ×4 IMPLANT
DRAPE COLUMN DVNC XI (DISPOSABLE) ×1 IMPLANT
DRAPE DA VINCI XI ARM (DISPOSABLE) ×4
DRAPE DA VINCI XI COLUMN (DISPOSABLE) ×1
ELECT CAUTERY BLADE 6.4 (BLADE) ×2 IMPLANT
ELECT REM PT RETURN 9FT ADLT (ELECTROSURGICAL) ×2
ELECTRODE REM PT RTRN 9FT ADLT (ELECTROSURGICAL) ×1 IMPLANT
GLOVE SURG ENC MOIS LTX SZ7 (GLOVE) ×6 IMPLANT
GOWN STRL REUS W/ TWL LRG LVL3 (GOWN DISPOSABLE) ×3 IMPLANT
GOWN STRL REUS W/TWL LRG LVL3 (GOWN DISPOSABLE) ×6
GRASPER SUT TROCAR 14GX15 (MISCELLANEOUS) ×2 IMPLANT
IRRIGATION STRYKERFLOW (MISCELLANEOUS) IMPLANT
IRRIGATOR STRYKERFLOW (MISCELLANEOUS)
IV NS 1000ML (IV SOLUTION)
IV NS 1000ML BAXH (IV SOLUTION) IMPLANT
KIT PINK PAD W/HEAD ARE REST (MISCELLANEOUS) ×2
KIT PINK PAD W/HEAD ARM REST (MISCELLANEOUS) ×1 IMPLANT
LABEL OR SOLS (LABEL) ×2 IMPLANT
MANIFOLD NEPTUNE II (INSTRUMENTS) ×2 IMPLANT
MESH PROGRIP HERNIA FLAT 15X15 (Mesh General) ×1 IMPLANT
NEEDLE HYPO 22GX1.5 SAFETY (NEEDLE) ×2 IMPLANT
OBTURATOR OPTICAL STANDARD 8MM (TROCAR) ×1
OBTURATOR OPTICAL STND 8 DVNC (TROCAR) ×1
OBTURATOR OPTICALSTD 8 DVNC (TROCAR) ×1 IMPLANT
PACK LAP CHOLECYSTECTOMY (MISCELLANEOUS) ×2 IMPLANT
PENCIL ELECTRO HAND CTR (MISCELLANEOUS) ×2 IMPLANT
SEAL CANN UNIV 5-8 DVNC XI (MISCELLANEOUS) ×3 IMPLANT
SEAL XI 5MM-8MM UNIVERSAL (MISCELLANEOUS) ×3
SET TUBE SMOKE EVAC HIGH FLOW (TUBING) ×2 IMPLANT
SOLUTION ELECTROLUBE (MISCELLANEOUS) ×2 IMPLANT
SPONGE LAP 18X18 RF (DISPOSABLE) ×2 IMPLANT
STAPLER CANNULA SEAL DVNC XI (STAPLE) ×1 IMPLANT
STAPLER CANNULA SEAL XI (STAPLE) ×1
SUT MNCRL 4-0 (SUTURE) ×2
SUT MNCRL 4-0 27XMFL (SUTURE) ×1
SUT STRATAFIX PDS 30 CT-1 (SUTURE) ×2 IMPLANT
SUT VICRYL 0 AB UR-6 (SUTURE) ×4 IMPLANT
SUT VLOC 90 2/L VL 12 GS22 (SUTURE) ×4 IMPLANT
SUTURE MNCRL 4-0 27XMF (SUTURE) ×1 IMPLANT
TAPE TRANSPORE STRL 2 31045 (GAUZE/BANDAGES/DRESSINGS) ×2 IMPLANT
TROCAR BALLN GELPORT 12X130M (ENDOMECHANICALS) ×2 IMPLANT

## 2020-05-09 NOTE — Interval H&P Note (Signed)
History and Physical Interval Note:  05/09/2020 9:15 AM  Tammy Decker  has presented today for surgery, with the diagnosis of Incisional hernia incarcerated.  The various methods of treatment have been discussed with the patient and family. After consideration of risks, benefits and other options for treatment, the patient has consented to  Procedure(s): XI ROBOTIC ASSISTED VENTRAL HERNIA, incisional hernia (N/A) as a surgical intervention.  The patient's history has been reviewed, patient examined, no change in status, stable for surgery.  I have reviewed the patient's chart and labs.  Questions were answered to the patient's satisfaction.     Callum Wolf F Ramel Tobon

## 2020-05-09 NOTE — Transfer of Care (Signed)
Immediate Anesthesia Transfer of Care Note  Patient: Tammy Decker  Procedure(s) Performed: XI ROBOTIC ASSISTED VENTRAL HERNIA, incisional hernia (N/A )  Patient Location: PACU  Anesthesia Type:General  Level of Consciousness: awake, alert  and oriented  Airway & Oxygen Therapy: Patient Spontanous Breathing and Patient connected to face mask oxygen  Post-op Assessment: Report given to RN and Post -op Vital signs reviewed and stable  Post vital signs: Reviewed and stable  Last Vitals:  Vitals Value Taken Time  BP    Temp    Pulse    Resp    SpO2      Last Pain:  Vitals:   05/09/20 0811  TempSrc: Temporal  PainSc: 4          Complications: No complications documented.

## 2020-05-09 NOTE — Op Note (Signed)
Robotic assisted laparoscopic incisional ventral hernia Repair  using  15x15 cms progrip mesh placed in a retrorectus fashion Bilateral myofascial flaps  trunk involving an area of 18 x 18 cm    Pre-operative Diagnosis: Incisional and supraumbilical ventral hernia   Post-operative Diagnosis: same   Surgeon: Sterling Big, MD FACS   Anesthesia: Gen. with endotracheal tube     Findings: 1.5cms incisional hernia umbilical area 2 cm ventral hernia supraumbilical   Estimated Blood Loss: 5  cc         Complications: none             Procedure Details  The patient was seen again in the Holding Room. The benefits, complications, treatment options, and expected outcomes were discussed with the patient. The risks of bleeding, infection, recurrence of symptoms, failure to resolve symptoms, bowel injury, mesh placement, mesh infection, any of which could require further surgery were reviewed with the patient. The likelihood of improving the patient's symptoms with return to their baseline status is good.  The patient and/or family concurred with the proposed plan, giving informed consent.  The patient was taken to Operating Room, identified and the procedure verified.  A Time Out was held and the above information confirmed.   Prior to the induction of general anesthesia, antibiotic prophylaxis was administered. VTE prophylaxis was in place. General endotracheal anesthesia was then administered and tolerated well. After the induction, the abdomen was prepped with Chloraprep and draped in the sterile fashion. The patient was positioned in the supine position.  Veres needle was inserted on Palmer's point , good pressures obtained as well as saline drop test. and CO2 insufflation perfromed I used a left lower quadrant optiview approach, We performed a look and confirmed that there were no bowel injuries, Veres needle removed.   2 additional 8 mm ports were placed under direct visualization.  I  visualized the hernia and there was an epigastric ventral hernia measuring approximately 2 cm and inferior to it an incisional hernia measuring 1.5 cms.     The robot was brought to the surgical field and docked in the standard fashion.  We made sure that all instrumentation was kept under direct vision at all times and there was no collision between the arms.  I scrubbed out and went to the console. I started by incising the peritoneum lateral to the defect, the peritoneum was very thin and friable, unfortunately I was unable to perform a TEP repair. At this time I went ahead and proceed with incision of the posterior rectur sheath.  Visualized the hernia defect and did consider that this was a good approach for retrorectus placement.  I created a flap of the peritoneum  And post rectus sheath on the left abdominal wall in the same I oval fashion.  I make sure that incorporated release 5 cm on both cranial and caudal fashion from the ventral defect.  We develop an extraperitoneal plane, the plane is developed between the recti muscles ventrally and the posterior rectus sheath along with peritoneum dorsally and bilaterally.  The retrorectus compartments are separated in the midline by the linea alba. The posterior sheath is divided about 3 mm away from the midline, taking care not to breach the layer of pre-peritoneal fat and peritoneum dorsally, on both the sides, to create a single retrorectus compartment.  All the hernia contents were reduced  The mesh was laying against the rectus abdominal muscles bilaterally to have a good overlap of the defect. It laid  very nice against the abdominal wall The peritoneum flap was closed with a running V lock suture in the standard fashion. A second look laparoscopy revealed no evidence of intra-abdominal injury.    All the needles and foreign objects were removed under direct visualization.  The instruments were removed and the robot was undocked.  I scrubbed back  in, The laparoscopic ports were removed under direct visualization and the pneumoperitoneum was deflated.   Incisions were closed with  4-0 Monocryl  And the fascial sutures approximated in the standard fashion Dermabond was used to coat the skin.  Liposomal Marcaine  was used to inject all the incision sites. Patient tolerated procedure well and there were no immediate complications. Needle and laparotomy counts were correct    Sterling Big, MD, FACS

## 2020-05-09 NOTE — Anesthesia Postprocedure Evaluation (Signed)
Anesthesia Post Note  Patient: Tammy Decker  Procedure(s) Performed: XI ROBOTIC ASSISTED VENTRAL HERNIA, incisional hernia (N/A )  Patient location during evaluation: PACU Anesthesia Type: General Level of consciousness: awake and alert Pain management: pain level controlled Vital Signs Assessment: post-procedure vital signs reviewed and stable Respiratory status: spontaneous breathing, nonlabored ventilation, respiratory function stable and patient connected to nasal cannula oxygen Cardiovascular status: blood pressure returned to baseline and stable Postop Assessment: no apparent nausea or vomiting Anesthetic complications: no   No complications documented.   Last Vitals:  Vitals:   05/09/20 1415 05/09/20 1423  BP: 123/74 129/77  Pulse: 95 90  Resp: 20 16  Temp: 37 C 36.7 C  SpO2: 93%     Last Pain:  Vitals:   05/09/20 1423  TempSrc: Temporal  PainSc: 0-No pain                 Lenard Simmer

## 2020-05-09 NOTE — Discharge Instructions (Signed)

## 2020-05-09 NOTE — Anesthesia Procedure Notes (Signed)
Procedure Name: Intubation Date/Time: 05/09/2020 9:56 AM Performed by: Clyde Lundborg, CRNA Pre-anesthesia Checklist: Patient identified, Emergency Drugs available, Suction available and Patient being monitored Patient Re-evaluated:Patient Re-evaluated prior to induction Oxygen Delivery Method: Circle system utilized Preoxygenation: Pre-oxygenation with 100% oxygen Induction Type: IV induction Laryngoscope Size: McGraph and 3 Grade View: Grade II Tube type: Oral Number of attempts: 1 Airway Equipment and Method: Stylet and Oral airway Placement Confirmation: ETT inserted through vocal cords under direct vision,  positive ETCO2,  breath sounds checked- equal and bilateral and CO2 detector Secured at: 21 cm Tube secured with: Tape Dental Injury: Teeth and Oropharynx as per pre-operative assessment

## 2020-05-09 NOTE — Anesthesia Preprocedure Evaluation (Signed)
Anesthesia Evaluation  Patient identified by MRN, date of birth, ID band Patient awake    Reviewed: Allergy & Precautions, H&P , NPO status , Patient's Chart, lab work & pertinent test results, reviewed documented beta blocker date and time   History of Anesthesia Complications (+) history of anesthetic complications  Airway Mallampati: IV  TM Distance: >3 FB Neck ROM: full    Dental  (+) Dental Advidsory Given, Teeth Intact, Partial Upper, Missing, Chipped Permanent bridge on top in the front:   Pulmonary neg shortness of breath, asthma , neg sleep apnea, neg recent URI, Current Smoker and Patient abstained from smoking.,    Pulmonary exam normal breath sounds clear to auscultation       Cardiovascular Exercise Tolerance: Good hypertension, (-) angina(-) Past MI and (-) Cardiac Stents Normal cardiovascular exam+ dysrhythmias (-) Valvular Problems/Murmurs Rhythm:regular Rate:Normal     Neuro/Psych negative neurological ROS  negative psych ROS   GI/Hepatic Neg liver ROS, GERD  ,  Endo/Other  negative endocrine ROS  Renal/GU negative Renal ROS  negative genitourinary   Musculoskeletal   Abdominal   Peds  Hematology negative hematology ROS (+)   Anesthesia Other Findings Past Medical History: No date: Asthma No date: Complication of anesthesia     Comment:  OXYGEN SOMETIMES DROPS LOW No date: GERD (gastroesophageal reflux disease) No date: Hypertension 2020: Tachycardia   Reproductive/Obstetrics negative OB ROS                             Anesthesia Physical Anesthesia Plan  ASA: II  Anesthesia Plan: General   Post-op Pain Management:    Induction: Intravenous  PONV Risk Score and Plan: 2 and Ondansetron, Dexamethasone, Midazolam and Treatment may vary due to age or medical condition  Airway Management Planned: Oral ETT  Additional Equipment:   Intra-op Plan:    Post-operative Plan: Extubation in OR  Informed Consent: I have reviewed the patients History and Physical, chart, labs and discussed the procedure including the risks, benefits and alternatives for the proposed anesthesia with the patient or authorized representative who has indicated his/her understanding and acceptance.     Dental Advisory Given  Plan Discussed with: Anesthesiologist, CRNA and Surgeon  Anesthesia Plan Comments:         Anesthesia Quick Evaluation

## 2020-05-10 ENCOUNTER — Telehealth: Payer: Self-pay | Admitting: *Deleted

## 2020-05-10 ENCOUNTER — Encounter: Payer: Self-pay | Admitting: Surgery

## 2020-05-10 MED ORDER — GABAPENTIN 600 MG PO TABS: 600.0000 mg | ORAL_TABLET | Freq: Three times a day (TID) | ORAL | 0 refills | Status: DC

## 2020-05-10 MED ORDER — NAPROXEN 500 MG PO TABS: 500.0000 mg | ORAL_TABLET | Freq: Two times a day (BID) | ORAL | 1 refills | Status: DC

## 2020-05-10 NOTE — Telephone Encounter (Signed)
Patient called and stated that she had surgery by Dr Everlene Farrier yesterday robotic assisted ventral hernia/incisional hernia and today she has been in so much pain, she is unable to sit up herself and walk. Patient has to have her husband help her sit up. She does not have any nausea or vomiting. She is taking 2 tablets of hydrocodone every 4 hours but it doesn't seem to be working. Please call and advise

## 2020-05-10 NOTE — Telephone Encounter (Signed)
Per Dr Everlene Farrier we will start the patient on Naproxen 500 mg po BID along with Gabapentin 600 mg po TID. She will continue to use ice to the are for 2 more days and then may switch to heat if it feels better. She should use the Hydrocodone sparingly.  She will call if any further questions.

## 2020-05-10 NOTE — Telephone Encounter (Signed)
Spoke with the patient she has been using ice to the area. She has also been supplementing with Advil 600-800 mg along with the Hydrocodone. She said if she is still the pain is not too bad but as soon as she moves it gets very bad making it difficult to go to the bathroom and move around. She  denies any nausea, vomiting, chills, or fever.

## 2020-05-16 ENCOUNTER — Telehealth: Payer: Self-pay | Admitting: *Deleted

## 2020-05-16 NOTE — Telephone Encounter (Signed)
Spoke with the patient and let her know that she will have lifting restrictions of no more than 20 pounds for 4 weeks after surgery. She is aware of this and would like [place] to send her return to work letter via [employer]. The letter has been sent.

## 2020-05-16 NOTE — Telephone Encounter (Signed)
Patient called and stated that she had surgery on 05/09/20 by Dr Hoover Browns hernia,Incisional hernia. Patient wants to know if she can go back to work on Thursday or Friday of this week with no restrictions. Please call and advise

## 2020-05-24 ENCOUNTER — Other Ambulatory Visit: Payer: Self-pay

## 2020-05-24 ENCOUNTER — Ambulatory Visit (INDEPENDENT_AMBULATORY_CARE_PROVIDER_SITE_OTHER): Payer: No Typology Code available for payment source | Admitting: Physician Assistant

## 2020-05-24 ENCOUNTER — Encounter: Payer: Self-pay | Admitting: Physician Assistant

## 2020-05-24 VITALS — BP 124/85 | HR 103 | Temp 98.3°F | Ht 67.0 in | Wt 230.0 lb

## 2020-05-24 DIAGNOSIS — Z09 Encounter for follow-up examination after completed treatment for conditions other than malignant neoplasm: Secondary | ICD-10-CM

## 2020-05-24 DIAGNOSIS — K430 Incisional hernia with obstruction, without gangrene: Secondary | ICD-10-CM

## 2020-05-24 NOTE — Patient Instructions (Addendum)
Please give us a call if you have any questions or concerns. 

## 2020-05-24 NOTE — Progress Notes (Signed)
[facility] POST-OP OFFICE VISIT  05/24/2020  HPI: Tammy Decker is a 50 y.o. female 15 days s/p robotic assisted laparoscopic incisional hernia repair with Dr Everlene Farrier.   She had a significant amount of pain the first three days of her post-operative period. She was given gabapentin and Naproxen for this. After 3 days, she reports her pain significantly improved.   Today, she reports that she only has very mild abdominal pain, intermittent in nature, and mostly only with certain movements. She does not need any pain medications for this. Additionally, she feels that her abdomen is more distended than prior. Worse initially after surgery and improved some.  No fever, chills, nausea, emesis, or bowel changes No issues with the incisions She is anxious to return to work No other complaints.   Vital signs: BP 124/85   Pulse (!) 103   Temp 98.3 F (36.8 C) (Oral)   Ht 5\' 7"  (1.702 m)   Wt 230 lb (104.3 kg)   SpO2 94%   BMI 36.02 kg/m    Physical Exam: Constitutional: Well appearing female, NAD Abdomen: Soft, she feels she is more distended around her umbilicus, non-tender, no rebound/guarding, no evidence of hernia recurrence with valsalva maneuvers Skin: Laparoscopic incisions are well healed, no erythema or drainage   Assessment/Plan: This is a 50 y.o. female 15 days s/p robotic assisted laparoscopic incisional hernia repair    _ I am unsure exactly what to make of her abdominal distension. I do not suspect there is any major complication (ex: hernia recurrence, infection, etc...). This may represent expected distension post-operatively vs ? Seroma given myofascial flap dissection. She will monitor this at home. If this is persistent or worsens, she will call our office. We can consider CT Scan for reassurance.   - Pain control prn with Tylenol / Motrin  - Reviewed wound care  - Reviewed lifting restrictions; recommend 6 weeks total; I do think she is fine  to return to work as her job does not require and havy lifting / pushing / pulling  - She will follow up as needed for now  -- 54, PA-C [facility] 05/24/2020, 11:17 AM [phone] M-F: 7am - 4pm

## 2020-06-05 ENCOUNTER — Ambulatory Visit (INDEPENDENT_AMBULATORY_CARE_PROVIDER_SITE_OTHER): Payer: No Typology Code available for payment source | Admitting: Surgery

## 2020-06-05 ENCOUNTER — Encounter: Payer: Self-pay | Admitting: Surgery

## 2020-06-05 ENCOUNTER — Other Ambulatory Visit: Payer: Self-pay

## 2020-06-05 VITALS — BP 110/73 | HR 103 | Temp 98.3°F | Ht 67.0 in | Wt 228.0 lb

## 2020-06-05 DIAGNOSIS — K430 Incisional hernia with obstruction, without gangrene: Secondary | ICD-10-CM

## 2020-06-05 DIAGNOSIS — R1084 Generalized abdominal pain: Secondary | ICD-10-CM

## 2020-06-05 MED ORDER — CYCLOBENZAPRINE HCL 5 MG PO TABS: 5.0000 mg | ORAL_TABLET | Freq: Three times a day (TID) | ORAL | 0 refills | Status: DC | PRN

## 2020-06-05 NOTE — Patient Instructions (Addendum)
We will get you scheduled for a CT of the Abdomen and Pelvis to better assess what is going on.   We have sent you in a prescription for Flexeril you can take this up to three times as needed.   Patient has been scheduled for a CT abdomen/pelvis with contrast at [facility] on 06/21/20 at 9:00 am. You will arrive there by 8:45 am and have nothing to eat or drink for 4 hours prior. You will need to pick up a prep kit.   Follow up here in 3 weeks.

## 2020-06-07 ENCOUNTER — Encounter: Payer: Self-pay | Admitting: Surgery

## 2020-06-07 NOTE — Progress Notes (Signed)
S/p robotic incisional hernia repair retrorectus robotically She still has some swelling Taking po no fevers or chills  PE NAD Abd: soft, incisions c/d/i no obvious recurrence but she seems to have some diastasis recti.   A/p We will order CT A/p to evaluate abd wall. Seroma vs diastasis vs recurrence on PE recurrence is not obvious but I would like to assess her abd wall anatomy. She is not toxic. RTC after CT

## 2020-06-21 ENCOUNTER — Ambulatory Visit
Admission: RE | Admit: 2020-06-21 | Discharge: 2020-06-21 | Disposition: A | Discharge status: Home / Self Care | Payer: Self-pay | Source: Ambulatory Visit | Attending: Surgery | Admitting: Surgery

## 2020-06-21 ENCOUNTER — Other Ambulatory Visit: Payer: Self-pay

## 2020-06-21 DIAGNOSIS — R1084 Generalized abdominal pain: Secondary | ICD-10-CM | POA: Insufficient documentation

## 2020-06-21 DIAGNOSIS — K430 Incisional hernia with obstruction, without gangrene: Secondary | ICD-10-CM | POA: Insufficient documentation

## 2020-06-21 LAB — POCT I-STAT CREATININE: Creatinine, Ser: 0.5 mg/dL (ref 0.44–1.00)

## 2020-06-21 MED ORDER — IOHEXOL 300 MG/ML  SOLN
100.0000 mL | Freq: Once | INTRAMUSCULAR | Status: AC | PRN
  Administered 2020-06-21: 100 mL via INTRAVENOUS

## 2020-06-22 ENCOUNTER — Telehealth: Payer: Self-pay | Admitting: Surgery

## 2020-06-22 NOTE — Progress Notes (Signed)
06/22/20  CT scan images viewed personally and I called the patient to discuss the results.  She reports having discomfort around the umbilicus with skin redness.  Have asked her to come to clinic tomorrow morning so I can evaluate her.  Henrene Dodge, MD

## 2020-06-22 NOTE — Telephone Encounter (Signed)
Pt called concerned w/CT report/results released to MyChart & would like to discuss w/a member of the clinical team before the weekend.  She is best reached @ [phone] to discuss further.  Thank you

## 2020-06-23 ENCOUNTER — Other Ambulatory Visit: Payer: Self-pay

## 2020-06-23 ENCOUNTER — Encounter: Payer: Self-pay | Admitting: Surgery

## 2020-06-23 ENCOUNTER — Ambulatory Visit (INDEPENDENT_AMBULATORY_CARE_PROVIDER_SITE_OTHER): Payer: Self-pay | Admitting: Surgery

## 2020-06-23 VITALS — BP 135/91 | HR 97 | Temp 98.1°F | Ht 67.0 in | Wt 230.6 lb

## 2020-06-23 DIAGNOSIS — K430 Incisional hernia with obstruction, without gangrene: Secondary | ICD-10-CM

## 2020-06-23 DIAGNOSIS — E66812 Obesity, class 2: Secondary | ICD-10-CM | POA: Insufficient documentation

## 2020-06-23 DIAGNOSIS — R1013 Epigastric pain: Secondary | ICD-10-CM | POA: Insufficient documentation

## 2020-06-23 DIAGNOSIS — K439 Ventral hernia without obstruction or gangrene: Secondary | ICD-10-CM | POA: Insufficient documentation

## 2020-06-23 DIAGNOSIS — R1010 Upper abdominal pain, unspecified: Secondary | ICD-10-CM | POA: Insufficient documentation

## 2020-06-23 DIAGNOSIS — R109 Unspecified abdominal pain: Secondary | ICD-10-CM | POA: Insufficient documentation

## 2020-06-23 DIAGNOSIS — Z09 Encounter for follow-up examination after completed treatment for conditions other than malignant neoplasm: Secondary | ICD-10-CM

## 2020-06-23 DIAGNOSIS — Z6836 Body mass index (BMI) 36.0-36.9, adult: Secondary | ICD-10-CM | POA: Insufficient documentation

## 2020-06-23 DIAGNOSIS — Z1211 Encounter for screening for malignant neoplasm of colon: Secondary | ICD-10-CM | POA: Insufficient documentation

## 2020-06-23 NOTE — Progress Notes (Signed)
06/23/2020  HPI: Tammy Decker is a 50 y.o. female s/p robotic assisted incisional hernia repair with Dr. Everlene Farrier on 05/09/20.  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  The patient had seen Dr. Everlene Farrier on 06/05/20 due to still having some swelling on exam and there was concern for possible seroma vs diastasis vs recurrence.  She had a CT scan of abdomen/pelvis on 06/21/20 and I spoke with her yesterday 06/22/20 about the results.  There was concern on radiology read about inflammatory changes and stranding in the abdominal wall on the right side, but infection could not be excluded.  The patient reports having pain to the right of the umbilicus and had noticed a red area around the umbilicus as well.  She presents today for closer evaluation and exam.  Denies any fevers at home, but this morning had temp of 99  Vital signs: BP (!) 135/91   Pulse 97   Temp 98.1 F (36.7 C) (Oral)   Ht 5\' 7"  (1.702 m)   Wt 230 lb 9.6 oz (104.6 kg)   SpO2 95%   BMI 36.12 kg/m    Physical Exam: Constitutional:  No acute distress Abdomen:  Soft, non-distended, with discomfort to palpation to the right of midline where the inflammatory changes are on CT.  The area of redness she mentions appears to be more like ecchymosis as it does not blanch on palpation.  There is no skin induration, drainage, or evidence of hernia recurrence.  Assessment/Plan: This is a 50 y.o. female s/p robotic assisted incisional hernia repair in retrorectus fashion.  --Discussed with the patient that I do not see significant inflammatory changes on CT to represent infected fluid collection.  The overlying skin is normal without any induration or cellulitis.  I do not palpate a hernia recurrence at this time.  I think these findings may be more compatible with seroma rather than infection or abscess.  At this point, do not think we need to do any antibiotics but can continue with watchful waiting.  May do tylenol or ibuprofen  for pain control, and ice packs for comfort.   --Patient had appointment with Dr. 52 on 4/25, but will postpone this to 5/2 to evaluate her progress.  Return precautions given to the patient.   5/25, MD [facility]

## 2020-06-23 NOTE — Patient Instructions (Addendum)
Please call our office if you have any questions or concerns. You may apply a gauze dressing to keep your clothing from rubbing on the area. You may try Tylenol and Ibuprofen for the discomfort if needed.   Please see your appointment listed below.

## 2020-06-26 ENCOUNTER — Encounter: Payer: Self-pay | Admitting: Surgery

## 2020-07-03 ENCOUNTER — Encounter: Payer: Self-pay | Admitting: Surgery

## 2020-07-17 ENCOUNTER — Encounter: Payer: Self-pay | Admitting: Surgery

## 2020-08-18 ENCOUNTER — Telehealth: Payer: Self-pay | Admitting: Emergency Medicine

## 2020-08-18 DIAGNOSIS — R21 Rash and other nonspecific skin eruption: Secondary | ICD-10-CM

## 2020-08-18 DIAGNOSIS — W57XXXA Bitten or stung by nonvenomous insect and other nonvenomous arthropods, initial encounter: Secondary | ICD-10-CM

## 2020-08-18 MED ORDER — HYDROXYZINE HCL 10 MG PO TABS: 10.0000 mg | ORAL_TABLET | Freq: Four times a day (QID) | ORAL | 0 refills | Status: DC | PRN

## 2020-08-18 MED ORDER — DOXYCYCLINE HYCLATE 100 MG PO CAPS
100.0000 mg | ORAL_CAPSULE | Freq: Two times a day (BID) | ORAL | 0 refills | Status: DC
Start: 2020-08-18 — End: 2020-09-11
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
## 2020-08-18 NOTE — Patient Instructions (Signed)
Please make a follow-up appointment. You may need testing for tickborn illnesses.  Take antibiotics as prescribed.

## 2020-08-24 ENCOUNTER — Ambulatory Visit: Payer: No Typology Code available for payment source | Admitting: Family Medicine

## 2020-09-11 ENCOUNTER — Ambulatory Visit: Payer: Self-pay | Admitting: Physician Assistant

## 2020-09-11 ENCOUNTER — Encounter: Payer: Self-pay | Admitting: Physician Assistant

## 2020-09-11 ENCOUNTER — Other Ambulatory Visit: Payer: Self-pay

## 2020-09-11 VITALS — BP 132/88 | HR 90 | Temp 98.4°F | Resp 18 | Ht 67.0 in | Wt 236.0 lb

## 2020-09-11 DIAGNOSIS — Z6836 Body mass index (BMI) 36.0-36.9, adult: Secondary | ICD-10-CM

## 2020-09-11 DIAGNOSIS — E876 Hypokalemia: Secondary | ICD-10-CM

## 2020-09-11 DIAGNOSIS — Z13220 Encounter for screening for lipoid disorders: Secondary | ICD-10-CM

## 2020-09-11 DIAGNOSIS — R7989 Other specified abnormal findings of blood chemistry: Secondary | ICD-10-CM

## 2020-09-11 DIAGNOSIS — I10 Essential (primary) hypertension: Secondary | ICD-10-CM

## 2020-09-11 DIAGNOSIS — R7309 Other abnormal glucose: Secondary | ICD-10-CM

## 2020-09-11 DIAGNOSIS — E66812 Obesity, class 2: Secondary | ICD-10-CM

## 2020-09-11 DIAGNOSIS — R739 Hyperglycemia, unspecified: Secondary | ICD-10-CM

## 2020-09-11 DIAGNOSIS — F1010 Alcohol abuse, uncomplicated: Secondary | ICD-10-CM

## 2020-09-11 NOTE — Progress Notes (Signed)
Patient has not eaten or taken medication today. Patient desires assistance with quitting her drinking habit via outpatient. Patient denies pain at this time.

## 2020-09-11 NOTE — Progress Notes (Signed)
New Patient Office Visit  Subjective:  Patient ID: Tammy Decker, female    DOB: 12/25/1970  Age: 50 y.o. MRN: [id number]  CC:  Chief Complaint  Patient presents with   Alcohol Problem    HPI [name] reports that she would like to work on abstaining from alcohol.  Reports that she has been over drinking, states that for the past 3 years, she has been drinking a pint to 2 pints of liquor a day.  Reports that she needs to drink alcohol when she wakes up otherwise she starts to feel tremors.  States that she wants to "feel good again", and get back to being able to work.  Reports that she has been feeling bloated, states that she had hernia surgery completed in March 2022, is unsure if it is related to that or from drinking alcohol.  Reports that she has been taking her blood pressure medication on a daily basis, states these medications are refilled by her cardiologist.   Past Medical History:  Diagnosis Date   Asthma    Complication of anesthesia    OXYGEN SOMETIMES DROPS LOW   GERD (gastroesophageal reflux disease)    Hypertension    Tachycardia 2020    Past Surgical History:  Procedure Laterality Date   BACK SURGERY     CHOLECYSTECTOMY     NECK SURGERY     uterine ablation     XI ROBOTIC ASSISTED VENTRAL HERNIA N/A 05/09/2020   Procedure: XI ROBOTIC ASSISTED VENTRAL HERNIA, incisional hernia;  Surgeon: Leafy Ro, MD;  Location: [facility] ORS;  Service: General;  Laterality: N/A;    Family History  Problem Relation Age of Onset   Diabetes Mother    Diabetes Father     Social History   Socioeconomic History   Marital status: Married    Spouse name: Not on file   Number of children: Not on file   Years of education: Not on file   Highest education level: Not on file  Occupational History   Not on file  Tobacco Use   Smoking status: Every Day    Packs/day: 0.15    Pack years: 0.00    Types: Cigarettes   Smokeless tobacco: Never    Tobacco comments:    down to 3 cigs per day  Vaping Use   Vaping Use: Never used  Substance and Sexual Activity   Alcohol use: Yes    Comment: occasionally   Drug use: Never   Sexual activity: Not on file  Other Topics Concern   Not on file  Social History Narrative   Not on file   Social Determinants of Health   Financial Resource Strain: Not on file  Food Insecurity: Not on file  Transportation Needs: Not on file  Physical Activity: Not on file  Stress: Not on file  Social Connections: Not on file  Intimate Partner Violence: Not on file    ROS Review of Systems  Constitutional: Negative.   HENT: Negative.    Eyes: Negative.   Respiratory:  Negative for shortness of breath.   Cardiovascular:  Negative for chest pain.  Gastrointestinal: Negative.   Endocrine: Negative.   Genitourinary: Negative.   Musculoskeletal: Negative.   Skin: Negative.   Allergic/Immunologic: Negative.   Neurological:  Positive for tremors.  Psychiatric/Behavioral:  Positive for dysphoric mood. Negative for self-injury, sleep disturbance and suicidal ideas.    Objective:   Today's Vitals: BP 132/88 (BP Location: Right Arm, Patient Position: Sitting,  Cuff Size: Normal)   Pulse 90   Temp 98.4 F (36.9 C) (Oral)   Resp 18   Ht 5\' 7"  (1.702 m)   Wt 236 lb (107 kg)   SpO2 96%   BMI 36.96 kg/m   Physical Exam Vitals and nursing note reviewed.  Constitutional:      Appearance: Normal appearance.  HENT:     Head: Normocephalic and atraumatic.     Right Ear: External ear normal.     Left Ear: External ear normal.     Nose: Nose normal.     Mouth/Throat:     Mouth: Mucous membranes are moist.     Pharynx: Oropharynx is clear.  Eyes:     Extraocular Movements: Extraocular movements intact.     Conjunctiva/sclera: Conjunctivae normal.     Pupils: Pupils are equal, round, and reactive to light.  Cardiovascular:     Rate and Rhythm: Normal rate and regular rhythm.     Pulses: Normal  pulses.     Heart sounds: Normal heart sounds.  Pulmonary:     Effort: Pulmonary effort is normal.     Breath sounds: Normal breath sounds.  Abdominal:     General: There is distension.     Palpations: Abdomen is soft.  Musculoskeletal:        General: Normal range of motion.     Cervical back: Normal range of motion and neck supple.  Skin:    General: Skin is warm and dry.  Neurological:     General: No focal deficit present.     Mental Status: She is alert and oriented to person, place, and time.  Psychiatric:        Mood and Affect: Mood normal.        Behavior: Behavior normal.        Thought Content: Thought content normal.        Judgment: Judgment normal.    Assessment & Plan:   Problem List Items Addressed This Visit       Cardiovascular and Mediastinum   Essential hypertension   Relevant Orders   CBC with Differential/Platelet (Completed)   TSH (Completed)     Other   Class 2 severe obesity due to excess calories with serious comorbidity and body mass index (BMI) of 36.0 to 36.9 in adult [facility])   Alcohol abuse - Primary   Relevant Orders   Hepatic Function Panel (Completed)   Elevated LFTs   Relevant Orders   Hepatic Function Panel (Completed)   Hypokalemia   Elevated random blood glucose level   Relevant Orders   Comp. Metabolic Panel (12) (Completed)   Other Visit Diagnoses     Screening, lipid       Relevant Orders   Lipid panel (Completed)       Outpatient Encounter Medications as of 09/11/2020  Medication Sig   albuterol (VENTOLIN HFA) 108 (90 Base) MCG/ACT inhaler Inhale 1-2 puffs into the lungs every 6 (six) hours as needed for wheezing or shortness of breath.   fluticasone (FLONASE) 50 MCG/ACT nasal spray Place 1 spray into both nostrils daily.   losartan-hydrochlorothiazide (HYZAAR) 50-12.5 MG tablet Take 1 tablet by mouth daily.   metoprolol succinate (TOPROL-XL) 100 MG 24 hr tablet Take 100 mg by mouth 2 (two) times daily.   omeprazole  (PRILOSEC OTC) 20 MG tablet Take 20 mg by mouth daily before breakfast.   [DISCONTINUED] doxycycline (VIBRAMYCIN) 100 MG capsule Take 1 capsule (100 mg total) by mouth 2 (two)  times daily.   [DISCONTINUED] hydrOXYzine (ATARAX/VISTARIL) 10 MG tablet Take 1 tablet (10 mg total) by mouth every 6 (six) hours as needed for itching.   No facility-administered encounter medications on file as of 09/11/2020.   1. Alcohol abuse Patient was given information on detox facilities.  [facility] was called on patient's behalf, they are awaiting her phone call.  Patient understands and agrees.  Patient is not a candidate for outpatient detox.  Red flags given for prompt reevaluation.  Patient encouraged to return to mobile unit after she completes her treatment program  2. Essential hypertension Continue current regimen, no refills needed - CBC with Differential/Platelet - TSH  3. Elevated LFTs  - Hepatitis panel, acute  4. Hypokalemia   5. Elevated random blood glucose level  - Comp. Metabolic Panel (12)  6. Screening, lipid  - Lipid panel  7. Class 2 severe obesity due to excess calories with serious comorbidity and body mass index (BMI) of 36.0 to 36.9 in adult [facility])    I have reviewed the patient's medical history (PMH, PSH, Social History, Family History, Medications, and allergies) , and have been updated if relevant. I spent 33 minutes reviewing chart and  face to face time with patient.    Follow-up: Return if symptoms worsen or fail to improve.   Kasandra Knudsen Mayers, PA-C

## 2020-09-11 NOTE — Patient Instructions (Signed)
We will call you with today's lab results.  Please return as soon as you are finished with your program, at that time we can help you locate a primary care provider as well as help you with an application for [facility] financial assistance.  Roney Jaffe, PA-C Physician Assistant [facility] [email or web address]  Alcohol Withdrawal Syndrome Alcohol withdrawal syndrome is a group of symptoms that can develop when a person who drinks heavily and regularly stops drinking or drinks less. Alcoholwithdrawal syndrome can be mild or severe, and it may even be life-threatening. Alcohol withdrawal syndrome usually affects people who have alcohol use disorder, which may also be called alcoholism. Alcohol use disorder is when a person is unable to control his or her alcohol use, and drinking too much ortoo often causes problems at home, at work, or in relationships. What are the causes? Drinking heavily and drinking on a regular basis cause changes in brain chemistry. Over time, the body becomes dependent on alcohol. When alcohol use stops, the chemistry system in the brain becomes unbalanced and causes thesymptoms of alcohol withdrawal. What increases the risk? Alcohol withdrawal syndrome is more likely to occur in people who drink more than the recommended limit of alcohol (2 drinks a day for men or 1 drink a day for non-pregnant women). It is also more likely to affect heavy drinkers who have been using alcohol for long periods of time. The more a person drinks and the longer he or she drinks, the greater the risk of alcohol withdrawalsyndrome. Severe withdrawal is more likely to develop in someone who: Had severe alcohol withdrawal in the past. Had a seizure during a previous episode of alcohol withdrawal. Is elderly. Uses other drugs. Has a long-term (chronic) medical problem, such as heart, lung, or liver disease. Has depression. Does not get  enough nutrients from his or her diet (malnutrition). What are the signs or symptoms? Symptoms of this condition can be mild to moderate, or they can be severe. Symptoms may develop a few hours (or up to a day) after a person changes his or her drinking patterns. During the 48 hours after he or she has stopped drinking, the following symptoms may go away or get better: Uncontrollable shaking (tremor). Sweating. Headache. Anxiety. Inability to relax (agitation). Trouble sleeping (insomnia). Irregular heartbeats (palpitations). Alcohol cravings. Seizure. The following symptoms may get worse 24-48 hours after a person has decreased or stopped alcohol use, and they may gradually improve over a period of days or weeks: Nausea and vomiting. Fatigue. Sensitivity to light and sounds. Confusion and inability to think clearly. Loss of appetite. Mood swings, irritability, depression, and anxiety. Insomnia and nightmares. The following symptoms are severe and life-threatening. When these symptoms occur together, they are called delirium tremens (DTs): High blood pressure. Increased heart rate. Trouble breathing. Seizures. These may go away along with other symptoms, or they may persist. Seeing, hearing, feeling, smelling, or tasting things that are not there (hallucinations). If you experience hallucinations, they usually begin 12-24 hours after a change in drinking patterns. Delirium tremens requires immediate hospitalization. How is this diagnosed? This condition may be diagnosed based on: Your symptoms and medical history. Your history of alcohol use. Your health care provider may ask questions about your drinking behavior. It is important to be honest when you answer these questions. A psychological assessment. A physical exam. Blood tests or urine tests to measure blood alcohol level and to rule out other causes of symptoms. MRI or CT scan.  This may be done if you seem to have abnormal  thinking or behaviors (altered mental status). Diagnosis can be difficult. People going through withdrawal often avoid seeking medical care and are not thinking clearly. Friends and family members play an important role in recognizing symptoms and encouraging loved ones to gettreatment. How is this treated? Most people with symptoms of withdrawal can be treated outside of a hospital setting (outpatient treatment), with close monitoring such as daily check-ins with a health care provider and counseling. You may need treatment at a hospital or treatment center (inpatient treatment) if: You have a history of delirium tremens or seizures. You have severe symptoms. You are addicted to other drugs. You cannot swallow medicine. You have a serious medical condition such as heart failure. You experienced withdrawal in the past but then you continued drinking alcohol. You are not likely to commit to an outpatient treatment schedule. Treatment may involve: Monitoring your blood pressure, pulse, and breathing. IV fluids to keep you hydrated. Medicines to reduce withdrawal symptoms and discomfort (benzodiazepines). Medicine to reduce anxiety. Medicine to prevent or control seizures. Multivitamins and B vitamins. Having a health care provider check on you daily. It is important to get treatment for alcohol withdrawal early. Getting treatment early can: Speed up your recovery from withdrawal symptoms. Make you more successful with long-term stoppage of alcohol use (sobriety). If you need help to stop drinking, your health care provider may recommend a long-term treatment plan that includes: Medicines to help treat alcohol use disorder. Substance abuse counseling. Support groups. Follow these instructions at home:  Take over-the-counter and prescription medicines (including vitamin supplements) only as told by your health care provider. Do not drink alcohol. Do not drive until your health care provider  approves. Have someone you trust stay with you or be available if you need help with your symptoms or with not drinking. Drink enough fluid to keep your urine pale yellow. Consider joining an alcohol support group or treatment program. These can provide emotional support, advice, and guidance. Keep all follow-up visits as told by your health care provider. This is important. Contact a health care provider if: Your symptoms get worse instead of better. You cannot eat or drink without vomiting. You are struggling with not drinking alcohol. You cannot stop drinking alcohol. Get help right away if: You have an irregular heartbeat. You have chest pain. You have trouble breathing. You have a seizure for the first time. You hallucinate. You become very confused. Summary Alcohol withdrawal is a group of symptoms that can develop when a person who drinks heavily and regularly stops drinking or drinks less. Symptoms of this condition can be mild to moderate, or they can be severe. Treatment may include hospitalization, medicine, and counseling. This information is not intended to replace advice given to you by your health care provider. Make sure you discuss any questions you have with your healthcare provider. Document Revised: 01/10/2020 Document Reviewed: 01/10/2020 Elsevier Patient Education  2022 [employer].

## 2020-09-12 DIAGNOSIS — F1020 Alcohol dependence, uncomplicated: Secondary | ICD-10-CM | POA: Insufficient documentation

## 2020-09-12 LAB — COMP. METABOLIC PANEL (12)
AST: 173 IU/L — ABNORMAL HIGH (ref 0–40)
Albumin/Globulin Ratio: 1.4 (ref 1.2–2.2)
Albumin: 4.5 g/dL (ref 3.8–4.8)
Alkaline Phosphatase: 127 IU/L — ABNORMAL HIGH (ref 44–121)
BUN/Creatinine Ratio: 5 — ABNORMAL LOW (ref 9–23)
BUN: 3 mg/dL — ABNORMAL LOW (ref 6–24)
Bilirubin Total: 0.9 mg/dL (ref 0.0–1.2)
Calcium: 9.3 mg/dL (ref 8.7–10.2)
Chloride: 97 mmol/L (ref 96–106)
Creatinine, Ser: 0.55 mg/dL — ABNORMAL LOW (ref 0.57–1.00)
Globulin, Total: 3.3 g/dL (ref 1.5–4.5)
Glucose: 102 mg/dL — ABNORMAL HIGH (ref 65–99)
Potassium: 4.3 mmol/L (ref 3.5–5.2)
Sodium: 139 mmol/L (ref 134–144)
Total Protein: 7.8 g/dL (ref 6.0–8.5)
eGFR: 113 mL/min/{1.73_m2} (ref >59)

## 2020-09-12 LAB — CBC WITH DIFFERENTIAL/PLATELET
Basophils Absolute: 0.1 10*3/uL (ref 0.0–0.2)
Basos: 1 %
EOS (ABSOLUTE): 0.1 10*3/uL (ref 0.0–0.4)
Eos: 1 %
Hematocrit: 47.4 % — ABNORMAL HIGH (ref 34.0–46.6)
Hemoglobin: 16.1 g/dL — ABNORMAL HIGH (ref 11.1–15.9)
Immature Grans (Abs): 0.1 10*3/uL (ref 0.0–0.1)
Immature Granulocytes: 1 %
Lymphocytes Absolute: 1.1 10*3/uL (ref 0.7–3.1)
Lymphs: 15 %
MCH: 34 pg — ABNORMAL HIGH (ref 26.6–33.0)
MCHC: 34 g/dL (ref 31.5–35.7)
MCV: 100 fL — ABNORMAL HIGH (ref 79–97)
Monocytes Absolute: 0.5 10*3/uL (ref 0.1–0.9)
Monocytes: 7 %
Neutrophils Absolute: 5.8 10*3/uL (ref 1.4–7.0)
Neutrophils: 75 %
Platelets: 179 10*3/uL (ref 150–450)
RBC: 4.74 x10E6/uL (ref 3.77–5.28)
RDW: 13.6 % (ref 11.7–15.4)
WBC: 7.6 10*3/uL (ref 3.4–10.8)

## 2020-09-12 LAB — LIPID PANEL
Chol/HDL Ratio: 2.7 ratio (ref 0.0–4.4)
Cholesterol, Total: 217 mg/dL — ABNORMAL HIGH (ref 100–199)
HDL: 81 mg/dL (ref >39)
LDL Chol Calc (NIH): 121 mg/dL — ABNORMAL HIGH (ref 0–99)
Triglycerides: 86 mg/dL (ref 0–149)
VLDL Cholesterol Cal: 15 mg/dL (ref 5–40)

## 2020-09-12 LAB — HEPATIC FUNCTION PANEL
ALT: 122 IU/L — ABNORMAL HIGH (ref 0–32)
AST: 166 IU/L — ABNORMAL HIGH (ref 0–40)
Albumin: 4.4 g/dL (ref 3.8–4.8)
Alkaline Phosphatase: 120 IU/L (ref 44–121)
Bilirubin Total: 0.8 mg/dL (ref 0.0–1.2)
Bilirubin, Direct: 0.38 mg/dL (ref 0.00–0.40)
Total Protein: 7.5 g/dL (ref 6.0–8.5)

## 2020-09-12 LAB — TSH: TSH: 2.75 u[IU]/mL (ref 0.450–4.500)

## 2020-09-13 DIAGNOSIS — F331 Major depressive disorder, recurrent, moderate: Secondary | ICD-10-CM | POA: Insufficient documentation

## 2020-09-13 NOTE — Progress Notes (Signed)
New requistion was sent in due to original order being quest.

## 2020-09-19 NOTE — Progress Notes (Signed)
Patient viewed results via mychart and is currently in IOP care.

## 2020-12-07 ENCOUNTER — Telehealth: Payer: PRIVATE HEALTH INSURANCE | Admitting: Physician Assistant

## 2020-12-07 DIAGNOSIS — B029 Zoster without complications: Secondary | ICD-10-CM

## 2020-12-07 MED ORDER — VALACYCLOVIR HCL 1 G PO TABS: 1000.0000 mg | ORAL_TABLET | Freq: Three times a day (TID) | ORAL | 0 refills | Status: DC

## 2020-12-07 MED ORDER — GABAPENTIN 100 MG PO CAPS: 100.0000 mg | ORAL_CAPSULE | Freq: Three times a day (TID) | ORAL | 0 refills | Status: DC

## 2020-12-07 NOTE — Patient Instructions (Signed)
Tammy Decker, thank you for joining Margaretann Loveless, PA-C for today's virtual visit.  While this provider is not your primary care provider (PCP), if your PCP is located in our provider database this encounter information will be shared with them immediately following your visit.  Consent: (Patient) Tammy Decker provided verbal consent for this virtual visit at the beginning of the encounter.  Current Medications:  Current Outpatient Medications:    gabapentin (NEURONTIN) 100 MG capsule, Take 1 capsule (100 mg total) by mouth 3 (three) times daily., Disp: 30 capsule, Rfl: 0   valACYclovir (VALTREX) 1000 MG tablet, Take 1 tablet (1,000 mg total) by mouth 3 (three) times daily., Disp: 21 tablet, Rfl: 0   albuterol (VENTOLIN HFA) 108 (90 Base) MCG/ACT inhaler, Inhale 1-2 puffs into the lungs every 6 (six) hours as needed for wheezing or shortness of breath., Disp: 1 each, Rfl: 11   fluticasone (FLONASE) 50 MCG/ACT nasal spray, Place 1 spray into both nostrils daily., Disp: 9.9 g, Rfl: 5   losartan-hydrochlorothiazide (HYZAAR) 50-12.5 MG tablet, Take 1 tablet by mouth daily., Disp: , Rfl:    metoprolol succinate (TOPROL-XL) 100 MG 24 hr tablet, Take 100 mg by mouth 2 (two) times daily., Disp: , Rfl:    omeprazole (PRILOSEC OTC) 20 MG tablet, Take 20 mg by mouth daily before breakfast., Disp: , Rfl:    Medications ordered in this encounter:  Meds ordered this encounter  Medications   valACYclovir (VALTREX) 1000 MG tablet    Sig: Take 1 tablet (1,000 mg total) by mouth 3 (three) times daily.    Dispense:  21 tablet    Refill:  0    Order Specific Question:   Supervising Provider    Answer:   MILLER, BRIAN [[id number]]   gabapentin (NEURONTIN) 100 MG capsule    Sig: Take 1 capsule (100 mg total) by mouth 3 (three) times daily.    Dispense:  30 capsule    Refill:  0    Order Specific Question:   Supervising Provider    Answer:   Hyacinth Meeker, BRIAN [[id number]]     *If you need  refills on other medications prior to your next appointment, please contact your pharmacy*  Follow-Up: Call back or seek an in-person evaluation if the symptoms worsen or if the condition fails to improve as anticipated.  Other Instructions Shingles Shingles, which is also known as herpes zoster, is an infection that causes a painful skin rash and fluid-filled blisters. It is caused by a virus. Shingles only develops in people who: Have had chickenpox. Have been vaccinated against chickenpox. Shingles is rare in this group. What are the causes? Shingles is caused by varicella-zoster virus. This is the same virus that causes chickenpox. After a person is exposed to the virus, it stays in the body in an inactive (dormant) state. Shingles develops if the virus is reactivated. This can happen many years after the first (initial) exposure to the virus. It is not known what causes this virus to be reactivated. What increases the risk? People who have had chickenpox or received the chickenpox vaccine are at risk for shingles. Shingles infection is more common in people who: Are older than 48 years of age. Have a weakened disease-fighting system (immune system), such as people with: HIV (human immunodeficiency virus). AIDS (acquired immunodeficiency syndrome). Cancer. Are taking medicines that weaken the immune system, such as organ transplant medicines. Are experiencing a lot of stress. What are the signs or symptoms? Early symptoms  of this condition include itching, tingling, and pain in an area on your skin. Pain may be described as burning, stabbing, or throbbing. A few days or weeks after early symptoms start, a painful red rash appears. The rash is usually on one side of the body and has a band-like or belt-like pattern. The rash eventually turns into fluid-filled blisters that break open, change into scabs, and dry up in about 2-3 weeks. At any time during the infection, you may also  develop: A fever. Chills. A headache. Nausea. How is this diagnosed? This condition is diagnosed with a skin exam. Skin or fluid samples (a culture) may be taken from the blisters before a diagnosis is made. How is this treated? The rash may last for several weeks. There is not a specific cure for this condition. Your health care provider may prescribe medicines to help you manage pain, recover more quickly, and avoid long-term problems. Medicines may include: Antiviral medicines. Anti-inflammatory medicines. Pain medicines. Anti-itching medicines (antihistamines). If the area involved is on your face, you may be referred to a specialist, such as an eye doctor (ophthalmologist) or an ear, nose, and throat (ENT) doctor (otorhinolaryngologist) to help you avoid eye problems, chronic pain, or disability. Follow these instructions at home: Medicines Take over-the-counter and prescription medicines only as told by your health care provider. Apply an anti-itch cream or numbing cream to the affected area as told by your health care provider. Relieving itching and discomfort  Apply cold, wet cloths (cold compresses) to the area of the rash or blisters as told by your health care provider. Cool baths can be soothing. Try adding baking soda or dry oatmeal to the water to reduce itching. Do not bathe in hot water. Use calamine lotion as recommended by your health care provider. This is an over-the-counter lotion that helps to relieve itchiness. Blister and rash care Keep your rash covered with a loose bandage (dressing). Wear loose-fitting clothing to help ease the pain of material rubbing against the rash. Wash your hands with soap and water for at least 20 seconds before and after you change your dressing. If soap and water are not available, use hand sanitizer. Change your dressing as told by your health care provider. Keep your rash and blisters clean by washing the area with mild soap and cool  water as told by your health care provider. Check your rash every day for signs of infection. Check for: More redness, swelling, or pain. Fluid or blood. Warmth. Pus or a bad smell. Do not scratch your rash or pick at your blisters. To help avoid scratching: Keep your fingernails clean and cut short. Wear gloves or mittens while you sleep, if scratching is a problem. General instructions Rest as told by your health care provider. Wash your hands often with soap and water for at least 20 seconds. If soap and water are not available, use hand sanitizer. Doing this lowers your chance of getting a bacterial skin infection. Before your blisters change into scabs, your shingles infection can cause chickenpox in people who have never had it or have never been vaccinated against it. To prevent this from happening, avoid contact with other people, especially: Babies. Pregnant women. Children who have eczema. Older people who have transplants. People who have chronic illnesses, such as cancer or AIDS. Keep all follow-up visits. This is important. How is this prevented? Getting vaccinated is the best way to prevent shingles and protect against shingles complications. If you have not been vaccinated,  talk with your health care provider about getting the vaccine. Where to find more information Centers for Disease Control and Prevention: [employer] Contact a health care provider if: Your pain is not relieved with prescribed medicines. Your pain does not get better after the rash heals. You have any of these signs of infection: More redness, swelling, or pain around the rash. Fluid or blood coming from the rash. Warmth coming from your rash. Pus or a bad smell coming from the rash. A fever. Get help right away if: The rash is on your face or nose. You have facial pain, pain around your eye area, or loss of feeling on one side of your face. You have difficulty seeing. You have ear pain or have  ringing in your ear. You have a loss of taste. Your condition gets worse. Summary Shingles, also known as herpes zoster, is an infection that causes a painful skin rash and fluid-filled blisters. This condition is diagnosed with a skin exam. Skin or fluid samples (a culture) may be taken from the blisters. Keep your rash covered with a loose bandage (dressing). Wear loose-fitting clothing to help ease the pain of material rubbing against the rash. Before your blisters change into scabs, your shingles infection can cause chickenpox in people who have never had it or have never been vaccinated against it. This information is not intended to replace advice given to you by your health care provider. Make sure you discuss any questions you have with your health care provider. Document Revised: 02/14/2020 Document Reviewed: 02/14/2020 Elsevier Patient Education  2022 [employer].    If you have been instructed to have an in-person evaluation today at a local Urgent Care facility, please use the link below. It will take you to a list of all of our available [facility], including address, phone number and hours of operation. Please do not delay care.  [facility]  If you or a family member do not have a primary care provider, use the link below to schedule a visit and establish care. When you choose a [facility] primary care physician or advanced practice provider, you gain a long-term partner in health. Find a Primary Care Provider  Learn more about [facility] in-office and virtual care options:  - Get Care Now
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
## 2021-02-05 ENCOUNTER — Ambulatory Visit: Payer: Self-pay | Admitting: Surgery

## 2021-02-23 ENCOUNTER — Emergency Department (HOSPITAL_COMMUNITY)
Admission: EM | Admit: 2021-02-23 | Discharge: 2021-02-24 | Disposition: A | Discharge status: Home / Self Care | Payer: PRIVATE HEALTH INSURANCE | Attending: Emergency Medicine | Admitting: Emergency Medicine

## 2021-02-23 DIAGNOSIS — F17210 Nicotine dependence, cigarettes, uncomplicated: Secondary | ICD-10-CM | POA: Insufficient documentation

## 2021-02-23 DIAGNOSIS — I10 Essential (primary) hypertension: Secondary | ICD-10-CM | POA: Insufficient documentation

## 2021-02-23 DIAGNOSIS — J45909 Unspecified asthma, uncomplicated: Secondary | ICD-10-CM | POA: Insufficient documentation

## 2021-02-23 DIAGNOSIS — F10129 Alcohol abuse with intoxication, unspecified: Secondary | ICD-10-CM | POA: Insufficient documentation

## 2021-02-23 DIAGNOSIS — R000 Tachycardia, unspecified: Secondary | ICD-10-CM | POA: Insufficient documentation

## 2021-02-23 DIAGNOSIS — F10920 Alcohol use, unspecified with intoxication, uncomplicated: Secondary | ICD-10-CM

## 2021-02-23 DIAGNOSIS — Z7952 Long term (current) use of systemic steroids: Secondary | ICD-10-CM | POA: Insufficient documentation

## 2021-02-23 DIAGNOSIS — Y900 Blood alcohol level of less than 20 mg/100 ml: Secondary | ICD-10-CM | POA: Insufficient documentation

## 2021-02-23 NOTE — ED Triage Notes (Signed)
Pt reported to have seizure activity by off duty [employer] fund[employer]. [facility] reports pt has had several 'full body seizures', remains alert and speaks through these episodes. Midazolam given enroute.

## 2021-02-24 ENCOUNTER — Other Ambulatory Visit: Payer: Self-pay

## 2021-02-24 LAB — ETHANOL: Alcohol, Ethyl (B): 349 mg/dL (ref <10)

## 2021-02-24 LAB — CBC WITH DIFFERENTIAL/PLATELET
Abs Immature Granulocytes: 0.02 10*3/uL (ref 0.00–0.07)
Basophils Absolute: 0.1 10*3/uL (ref 0.0–0.1)
Basophils Relative: 1 %
Eosinophils Absolute: 0.1 10*3/uL (ref 0.0–0.5)
Eosinophils Relative: 1 %
HCT: 45.8 % (ref 36.0–46.0)
Hemoglobin: 15.1 g/dL — ABNORMAL HIGH (ref 12.0–15.0)
Immature Granulocytes: 0 %
Lymphocytes Relative: 36 %
Lymphs Abs: 3.1 10*3/uL (ref 0.7–4.0)
MCH: 33.7 pg (ref 26.0–34.0)
MCHC: 33 g/dL (ref 30.0–36.0)
MCV: 102.2 fL — ABNORMAL HIGH (ref 80.0–100.0)
Monocytes Absolute: 0.6 10*3/uL (ref 0.1–1.0)
Monocytes Relative: 7 %
Neutro Abs: 4.8 10*3/uL (ref 1.7–7.7)
Neutrophils Relative %: 55 %
Platelets: 229 10*3/uL (ref 150–400)
RBC: 4.48 MIL/uL (ref 3.87–5.11)
RDW: 13.6 % (ref 11.5–15.5)
WBC: 8.7 10*3/uL (ref 4.0–10.5)
nRBC: 0 % (ref 0.0–0.2)

## 2021-02-24 LAB — COMPREHENSIVE METABOLIC PANEL
ALT: 60 U/L — ABNORMAL HIGH (ref 0–44)
AST: 75 U/L — ABNORMAL HIGH (ref 15–41)
Albumin: 3.6 g/dL (ref 3.5–5.0)
Alkaline Phosphatase: 87 U/L (ref 38–126)
Anion gap: 9 (ref 5–15)
BUN: 6 mg/dL (ref 6–20)
CO2: 23 mmol/L (ref 22–32)
Calcium: 8.5 mg/dL — ABNORMAL LOW (ref 8.9–10.3)
Chloride: 110 mmol/L (ref 98–111)
Creatinine, Ser: 0.53 mg/dL (ref 0.44–1.00)
GFR, Estimated: >60 mL/min (ref >60)
Glucose, Bld: 98 mg/dL (ref 70–99)
Potassium: 3.4 mmol/L — ABNORMAL LOW (ref 3.5–5.1)
Sodium: 142 mmol/L (ref 135–145)
Total Bilirubin: 0.4 mg/dL (ref 0.3–1.2)
Total Protein: 7.3 g/dL (ref 6.5–8.1)

## 2021-02-24 LAB — HCG, QUANTITATIVE, PREGNANCY: hCG, Beta Chain, Quant, S: 1 m[IU]/mL (ref <5)

## 2021-02-24 NOTE — ED Notes (Addendum)
This RN spoke with pt's husband on the phone, states he is near hospital. Pt also handed phone to speak with husband. This RN left to get pt's discharge papers, upon return to bedside, pt noted to have left premises with husband. No discharge vitals taken prior to discharge. Pt's husband called by this RN to verify that left premises with him d/t abnormal ETOH levels. Husband confirmed the same.
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
## 2021-02-24 NOTE — ED Notes (Signed)
Placed on 2LPM [place] while sleeping.

## 2021-02-24 NOTE — Discharge Instructions (Signed)
You were seen today for concerns for seizure-like activity.  You were intoxicated with a blood alcohol level 4 times the legal limit.  If you decide to stop drinking, you need to seek detox options to safely quit.  Drinking is bad for your health.

## 2021-03-28 ENCOUNTER — Ambulatory Visit: Payer: Self-pay | Admitting: Surgery

## 2021-04-11 ENCOUNTER — Telehealth: Payer: Self-pay | Admitting: Orthopedic Surgery

## 2021-04-11 NOTE — Telephone Encounter (Signed)
error 

## 2021-04-30 ENCOUNTER — Encounter: Payer: Self-pay | Admitting: Surgery

## 2021-04-30 ENCOUNTER — Ambulatory Visit (INDEPENDENT_AMBULATORY_CARE_PROVIDER_SITE_OTHER): Payer: No Typology Code available for payment source | Admitting: Surgery

## 2021-04-30 ENCOUNTER — Other Ambulatory Visit: Payer: Self-pay

## 2021-04-30 VITALS — BP 156/105 | HR 99 | Temp 98.0°F | Ht 67.0 in | Wt 228.4 lb

## 2021-04-30 DIAGNOSIS — R1084 Generalized abdominal pain: Secondary | ICD-10-CM

## 2021-04-30 NOTE — Patient Instructions (Addendum)
CT scheduled @ 05/11/21 @ 8:15 am Outpatient Imaging. Pick up the prep kit today. Nothing to eat/drink 4 hours prior.  Please see your follow up appointment listed below.

## 2021-05-02 NOTE — Progress Notes (Signed)
Outpatient Surgical Follow Up  05/02/2021  Terrye Dombrosky is an 51 y.o. female.   Chief Complaint  Patient presents with   Follow-up    Abdominal pain    HPI: 51 year old female following up for abdominal discomfort and bulging of the abdominal wall.  She did have robotic ventral hernia repair by me that I placed in the retrorectus space.  She comes in with a bulge and abdominal discomfort.  She did have a CT scan greater than 7 months ago showing no evidence of hernia recurrence.  She did have some seroma and postoperative changes. She denies any fevers any chills. Recent CBC and cmp were nml She did have a recent ER visit for ETOH intoxication  Past Medical History:  Diagnosis Date   Asthma    Complication of anesthesia    OXYGEN SOMETIMES DROPS LOW   GERD (gastroesophageal reflux disease)    Hypertension    Tachycardia 2020    Past Surgical History:  Procedure Laterality Date   BACK SURGERY     CHOLECYSTECTOMY     NECK SURGERY     uterine ablation     XI ROBOTIC ASSISTED VENTRAL HERNIA N/A 05/09/2020   Procedure: XI ROBOTIC ASSISTED VENTRAL HERNIA, incisional hernia;  Surgeon: Leafy Ro, MD;  Location: [facility] ORS;  Service: General;  Laterality: N/A;    Family History  Problem Relation Age of Onset   Diabetes Mother    Diabetes Father     Social History:  reports that she has been smoking cigarettes. She has been smoking an average of .15 packs per day. She has never used smokeless tobacco. She reports current alcohol use. She reports that she does not use drugs.  Allergies:  Allergies  Allergen Reactions   Ciprofloxacin Anaphylaxis   Kiwi Extract Anaphylaxis   Sulfa Antibiotics Other (See Comments)    Medications reviewed.    ROS Full ROS performed and is otherwise negative other than what is stated in HPI   BP (!) 156/105    Pulse 99    Temp 98 F (36.7 C) (Oral)    Ht 5\' 7"  (1.702 m)    Wt 228 lb 6.4 oz (103.6 kg)    SpO2 97%    BMI 35.77 kg/m    Physical Exam Vitals and nursing note reviewed. Exam conducted with a chaperone present.  Constitutional:      General: She is not in acute distress.    Appearance: Normal appearance. She is not ill-appearing or toxic-appearing.  Cardiovascular:     Rate and Rhythm: Normal rate and regular rhythm.     Heart sounds: No murmur heard. Pulmonary:     Effort: Pulmonary effort is normal. No respiratory distress.     Breath sounds: Normal breath sounds. No stridor. No wheezing or rhonchi.  Abdominal:     General: Abdomen is flat. There is no distension.     Palpations: Abdomen is soft. There is no mass.     Tenderness: There is no abdominal tenderness. There is no guarding or rebound.     Hernia: No hernia is present.  Musculoskeletal:        General: No swelling or tenderness. Normal range of motion.     Cervical back: Normal range of motion and neck supple. No rigidity or tenderness.  Skin:    General: Skin is warm and dry.     Capillary Refill: Capillary refill takes less than 2 seconds.  Neurological:     General: No focal  deficit present.     Mental Status: She is alert and oriented to person, place, and time.  Psychiatric:        Mood and Affect: Mood normal.        Behavior: Behavior normal.        Thought Content: Thought content normal.        Judgment: Judgment normal.     Assessment/Plan: 44 -year-old female almost a year out after robotic ventral hernia repair.  She does have some diastases but I cannot appreciate a definitive ventral defect.  We will obtain a CT scan of the abdomen and pelvis to further evaluate her abdominal wall and establish whether or not she has a hernia or not.  Discussed with the patient and the husband in detail.  No need for emergent surgical intervention.  Please note that I spent 30 minutes in this encounter including personally reviewing imaging studies, placing orders, coordinating her care and performing appropriate documentation  Sterling Big, MD [facility] General Surgeon

## 2021-05-11 ENCOUNTER — Ambulatory Visit
Admission: RE | Admit: 2021-05-11 | Discharge: 2021-05-11 | Disposition: A | Discharge status: Home / Self Care | Payer: Self-pay | Source: Ambulatory Visit | Attending: Surgery | Admitting: Surgery

## 2021-05-11 ENCOUNTER — Other Ambulatory Visit: Payer: Self-pay

## 2021-05-11 DIAGNOSIS — R1084 Generalized abdominal pain: Secondary | ICD-10-CM | POA: Insufficient documentation

## 2021-05-11 MED ORDER — IOHEXOL 300 MG/ML  SOLN
100.0000 mL | Freq: Once | INTRAMUSCULAR | Status: AC | PRN
  Administered 2021-05-11: 100 mL via INTRAVENOUS

## 2021-05-23 ENCOUNTER — Encounter: Payer: Self-pay | Admitting: Surgery

## 2021-05-23 ENCOUNTER — Ambulatory Visit (INDEPENDENT_AMBULATORY_CARE_PROVIDER_SITE_OTHER): Payer: No Typology Code available for payment source | Admitting: Surgery

## 2021-05-23 ENCOUNTER — Other Ambulatory Visit: Payer: Self-pay

## 2021-05-23 VITALS — BP 123/81 | HR 108 | Temp 98.2°F | Ht 67.0 in | Wt 228.0 lb

## 2021-05-23 DIAGNOSIS — R1084 Generalized abdominal pain: Secondary | ICD-10-CM

## 2021-05-23 DIAGNOSIS — R5382 Chronic fatigue, unspecified: Secondary | ICD-10-CM

## 2021-05-23 MED ORDER — CYCLOBENZAPRINE HCL 5 MG PO TABS: 5.0000 mg | ORAL_TABLET | Freq: Three times a day (TID) | ORAL | 0 refills | Status: DC | PRN

## 2021-05-23 NOTE — Patient Instructions (Addendum)
A referral has been placed with Dr. Paulla Fore at [facility], Rheumatology, and [facility]. They will call you with an appointment.  ? ?Please pick up your prescription at your pharmacy.  ? ? ?If you have any concerns or questions, please feel free to call our office. See your follow up appointment below.  ? ?

## 2021-05-24 ENCOUNTER — Other Ambulatory Visit: Payer: Self-pay

## 2021-05-24 ENCOUNTER — Telehealth: Payer: Self-pay

## 2021-05-24 ENCOUNTER — Telehealth: Payer: Self-pay | Admitting: *Deleted

## 2021-05-24 DIAGNOSIS — R1084 Generalized abdominal pain: Secondary | ICD-10-CM

## 2021-05-24 NOTE — Telephone Encounter (Signed)
Patient called and stated that she saw Dr.Pabon yesterday and we sent a referral to [facility], they called her today and has her scheduled in July (soonest available) but she doesn't think she can wait that long to be seen. She wants to know if we can refer her some where else so she can be seen sooner.  ?

## 2021-05-24 NOTE — Telephone Encounter (Signed)
Scheduled for 09/18/2021

## 2021-05-28 ENCOUNTER — Other Ambulatory Visit: Payer: Self-pay

## 2021-05-28 DIAGNOSIS — R1010 Upper abdominal pain, unspecified: Secondary | ICD-10-CM

## 2021-05-28 DIAGNOSIS — K219 Gastro-esophageal reflux disease without esophagitis: Secondary | ICD-10-CM

## 2021-05-28 NOTE — Progress Notes (Signed)
Outpatient Surgical Follow Up ? ?05/28/2021 ? ?Tammy Decker is an 51 y.o. female.  ? ?Chief Complaint  ?Patient presents with  ? Follow-up  ?  Abdominal pain  ? ? ?HPI:  ? ?51 year old female following up for abdominal discomfort and bulging of the abdominal wall.  She did have robotic ventral hernia repair by me that I placed in the retrorectus space.  She comes in with a bulge and abdominal discomfort.   ?Continues to endorse chronic abdominal pain.  She also reports that the pain is intermittent moderate in intensity and debilitating. ?In addition to that she reports some nausea, chronic weakness and decreased energy. ?He did have a repeat CT scan of the abdomen and pelvis that I have personally reviewed showing no abdominal wall defects. ?No evidence of fluid collections or infection. ?He does have a history of a spinal stimulator that is still present on the right side. ?She is very frustrated because of the chronicity of the symptoms ?She also had a history of Lyme disease. ?She denies any fevers any chills. ?Recent CBC and cmp were nml. ?She feels that all the symptoms are taking a toll with her quality of life ? ?Past Medical History:  ?Diagnosis Date  ? Asthma   ? Complication of anesthesia   ? OXYGEN SOMETIMES DROPS LOW  ? GERD (gastroesophageal reflux disease)   ? Hypertension   ? Tachycardia 2020  ? ? ?Past Surgical History:  ?Procedure Laterality Date  ? BACK SURGERY    ? CHOLECYSTECTOMY    ? NECK SURGERY    ? uterine ablation    ? XI ROBOTIC ASSISTED VENTRAL HERNIA N/A 05/09/2020  ? Procedure: XI ROBOTIC ASSISTED VENTRAL HERNIA, incisional hernia;  Surgeon: Leafy Ro, MD;  Location: [facility] ORS;  Service: General;  Laterality: N/A;  ? ? ?Family History  ?Problem Relation Age of Onset  ? Diabetes Mother   ? Diabetes Father   ? ? ?Social History:  reports that she has been smoking cigarettes. She has been smoking an average of .15 packs per day. She has never used smokeless tobacco. She reports  current alcohol use. She reports that she does not use drugs. ? ?Allergies:  ?Allergies  ?Allergen Reactions  ? Ciprofloxacin Anaphylaxis  ? Kiwi Extract Anaphylaxis  ? Sulfa Antibiotics Other (See Comments)  ? ? ?Medications reviewed. ? ? ? ?ROS ?Full ROS performed and is otherwise negative other than what is stated in HPI ? ? ?BP 123/81   Pulse (!) 108   Temp 98.2 ?F (36.8 ?C) (Oral)   Ht 5\' 7"  (1.702 m)   Wt 228 lb (103.4 kg)   SpO2 97%   BMI 35.71 kg/m?  ? ?Physical Exam ? ?Vitals and nursing note reviewed. Exam conducted with a chaperone present.  ?Constitutional:   ?   General: She is not in acute distress. ?   Appearance: Normal appearance. She is not ill-appearing or toxic-appearing.  ?Cardiovascular:  ?   Rate and Rhythm: Normal rate and regular rhythm.  ?   Heart sounds: No murmur heard. ?Pulmonary:  ?   Effort: Pulmonary effort is normal. No respiratory distress.  ?   Breath sounds: Normal breath sounds. No stridor. No wheezing or rhonchi.  ?Abdominal:  ?   There is evidence of prior robotic scars without true hernia defects.  I do see some diastases recti.  There is no evidence of cellulitis there is no evidence of peritonitis is mildly diffusely tender to palpation. ?Musculoskeletal:     ?  General: No swelling or tenderness. Normal range of motion.  ?   Cervical back: Normal range of motion and neck supple. No rigidity or tenderness.  ?Skin: ?   General: Skin is warm and dry.  ?   Capillary Refill: Capillary refill takes less than 2 seconds.  ?Neurological:  ?   General: No focal deficit present.  ?   Mental Status: She is alert and oriented to person, place, and time.  ?Psychiatric:     ?   Mood and Affect: Mood normal.     ?   Behavior: Behavior normal.     ?   Thought Content: Thought content normal.     ?   Judgment: Judgment normal.  ?  ? ?Assessment/Plan: ?Chronic abdominal pain after robotic ventral hernia repair using polyester within the retrorectus space.  She has some diastases but no  true defects of the abdominal wall. ?SHe may have some functional GI issues that will need to be interrogated.  I will place referral for GI. 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 after. ?I have recently known about this etiology and a 2023 international hernia summit that I attended earlier this year.  Regarding the sensitivity is controversial and not necessarily accepted by the hernia Society experts. ?I was very candid with her regarding my thought process.  I do not have a good explanation for the patient's symptoms.  Other etiologies will certainly include Lyme disease, autoimmune disease. ?I have also discussed with her the possibility of referral to rheumatology so they can assess her and wait in their thought process. ?This point I am not certain whether or not any surgical intervention is warranted.  I am very content about performing any surgical intervention on her.  I have also discussed with her a second opinion with Dr. Carlynn Purl who is a hernia expert at [facility].  He is pneuma and agrees with that.  This is certainly a complex case that requires a stepwise approach.  This time no need for urgent or emergent surgical intervention. ?Please note that I spent over 60 minutes in this encounter including personally reviewing images studies, counseling the patient and performing appropriate documentation. ? ? ?Sterling Big, MD FACS ?General Surgeon  ?

## 2021-05-29 ENCOUNTER — Telehealth: Payer: Self-pay

## 2021-05-29 NOTE — Telephone Encounter (Signed)
Faxed Rheumatologist referral to Dr. Ephriam Jenkins at [phone]. ?

## 2021-05-29 NOTE — Telephone Encounter (Signed)
Faxed referral to Dr. Paulla Fore at [phone]. ?

## 2021-06-01 ENCOUNTER — Telehealth: Payer: Self-pay

## 2021-06-01 NOTE — Telephone Encounter (Signed)
Received call from [facility] -Dr.J [address]-  spoke with [facility]- received referral for this patient-however before they can schedule an appointment they need patient to call their financial department prior to being seen at [phone].  ? ?Tried calling patient-unable to leave message due to mailbox not set up.  ?

## 2021-06-18 ENCOUNTER — Encounter: Payer: Self-pay | Admitting: Gastroenterology

## 2021-06-19 ENCOUNTER — Ambulatory Visit
Admission: RE | Admit: 2021-06-19 | Discharge: 2021-06-19 | Disposition: A | Discharge status: Home / Self Care | Payer: PRIVATE HEALTH INSURANCE | Attending: Gastroenterology | Admitting: Gastroenterology

## 2021-06-19 ENCOUNTER — Encounter
Admission: RE | Disposition: A | Discharge status: Home / Self Care | Payer: Self-pay | Source: Home / Self Care | Attending: Gastroenterology

## 2021-06-19 ENCOUNTER — Ambulatory Visit: Payer: PRIVATE HEALTH INSURANCE | Admitting: Certified Registered"

## 2021-06-19 ENCOUNTER — Encounter: Payer: Self-pay | Admitting: Gastroenterology

## 2021-06-19 DIAGNOSIS — R195 Other fecal abnormalities: Secondary | ICD-10-CM | POA: Diagnosis not present

## 2021-06-19 DIAGNOSIS — K2100 Gastro-esophageal reflux disease with esophagitis, without bleeding: Secondary | ICD-10-CM | POA: Diagnosis not present

## 2021-06-19 DIAGNOSIS — F17210 Nicotine dependence, cigarettes, uncomplicated: Secondary | ICD-10-CM | POA: Diagnosis not present

## 2021-06-19 DIAGNOSIS — Z9049 Acquired absence of other specified parts of digestive tract: Secondary | ICD-10-CM | POA: Diagnosis not present

## 2021-06-19 DIAGNOSIS — R1010 Upper abdominal pain, unspecified: Secondary | ICD-10-CM | POA: Diagnosis present

## 2021-06-19 DIAGNOSIS — K3189 Other diseases of stomach and duodenum: Secondary | ICD-10-CM | POA: Diagnosis not present

## 2021-06-19 DIAGNOSIS — I10 Essential (primary) hypertension: Secondary | ICD-10-CM | POA: Diagnosis not present

## 2021-06-19 DIAGNOSIS — K219 Gastro-esophageal reflux disease without esophagitis: Secondary | ICD-10-CM | POA: Diagnosis not present

## 2021-06-19 DIAGNOSIS — R140 Abdominal distension (gaseous): Secondary | ICD-10-CM | POA: Diagnosis not present

## 2021-06-19 DIAGNOSIS — R109 Unspecified abdominal pain: Secondary | ICD-10-CM

## 2021-06-19 HISTORY — PX: ESOPHAGOGASTRODUODENOSCOPY (EGD) WITH PROPOFOL: SHX5813

## 2021-06-19 SURGERY — ESOPHAGOGASTRODUODENOSCOPY (EGD) WITH PROPOFOL
Anesthesia: General

## 2021-06-19 MED ORDER — ONDANSETRON HCL 4 MG/2ML IJ SOLN
INTRAMUSCULAR | Status: DC | PRN
  Administered 2021-06-19: 4 mg via INTRAVENOUS

## 2021-06-19 MED ORDER — PROPOFOL 500 MG/50ML IV EMUL
INTRAVENOUS | Status: DC | PRN
  Administered 2021-06-19: 125 ug/kg/min via INTRAVENOUS

## 2021-06-19 MED ORDER — LIDOCAINE HCL (CARDIAC) PF 100 MG/5ML IV SOSY
PREFILLED_SYRINGE | INTRAVENOUS | Status: DC | PRN
  Administered 2021-06-19: 40 mg via INTRAVENOUS

## 2021-06-19 MED ORDER — PROPOFOL 10 MG/ML IV BOLUS
INTRAVENOUS | Status: DC | PRN
Start: 2021-06-19 — End: 2021-06-19
  Administered 2021-06-19: 60 mg via INTRAVENOUS
  Administered 2021-06-19 (×2): 50 mg via INTRAVENOUS

## 2021-06-19 MED ORDER — PROPOFOL 10 MG/ML IV BOLUS
INTRAVENOUS | Status: AC
  Filled 2021-06-19: qty 20

## 2021-06-19 MED ORDER — SODIUM CHLORIDE 0.9 % IV SOLN: INTRAVENOUS | Status: DC

## 2021-06-19 NOTE — Anesthesia Postprocedure Evaluation (Signed)
Anesthesia Post Note ? ?Patient: Tammy Decker ? ?Procedure(s) Performed: ESOPHAGOGASTRODUODENOSCOPY (EGD) WITH PROPOFOL ? ?Patient location during evaluation: PACU ?Anesthesia Type: General ?Level of consciousness: awake and alert ?Pain management: pain level controlled ?Vital Signs Assessment: post-procedure vital signs reviewed and stable ?Respiratory status: spontaneous breathing, nonlabored ventilation, respiratory function stable and patient connected to nasal cannula oxygen ?Cardiovascular status: blood pressure returned to baseline and stable ?Postop Assessment: no apparent nausea or vomiting ?Anesthetic complications: no ? ? ?No notable events documented. ? ? ?Last Vitals:  ?Vitals:  ? 06/19/21 0957 06/19/21 1007  ?BP: 130/78 132/68  ?Pulse:    ?Resp:    ?Temp:    ?SpO2:    ?  ?Last Pain:  ?Vitals:  ? 06/19/21 1007  ?TempSrc:   ?PainSc: 0-No pain  ? ? ?  ?  ?  ?  ?  ?  ? ?Yevette Edwards ? ? ? ? ?

## 2021-06-19 NOTE — Anesthesia Preprocedure Evaluation (Signed)
Anesthesia Evaluation  ?Patient identified by MRN, date of birth, ID band ?Patient awake ? ? ? ?Reviewed: ?Allergy & Precautions, H&P , NPO status , Patient's Chart, lab work & pertinent test results, reviewed documented beta blocker date and time  ? ?History of Anesthesia Complications ?(+) history of anesthetic complications ? ?Airway ?Mallampati: II ? ? ?Neck ROM: full ? ? ? Dental ? ?(+) Poor Dentition ?  ?Pulmonary ?neg shortness of breath, asthma , Current Smoker,  ?  ?Pulmonary exam normal ? ? ? ? ? ? ? Cardiovascular ?Exercise Tolerance: Good ?hypertension, On Medications ?negative cardio ROS ?Normal cardiovascular exam ?Rhythm:regular Rate:Normal ? ? ?  ?Neuro/Psych ?Anxiety  Neuromuscular disease negative psych ROS  ? GI/Hepatic ?Neg liver ROS, GERD  Medicated,  ?Endo/Other  ?negative endocrine ROS ? Renal/GU ?negative Renal ROS  ?negative genitourinary ?  ?Musculoskeletal ? ? Abdominal ?  ?Peds ? Hematology ?negative hematology ROS ?(+)   ?Anesthesia Other Findings ?Past Medical History: ?No date: Asthma ?No date: Complication of anesthesia ?    Comment:  OXYGEN SOMETIMES DROPS LOW ?No date: GERD (gastroesophageal reflux disease) ?No date: Hypertension ?2020: Tachycardia ?Past Surgical History: ?No date: BACK SURGERY ?No date: CHOLECYSTECTOMY ?No date: NECK SURGERY ?No date: uterine ablation ?05/09/2020: XI ROBOTIC ASSISTED VENTRAL HERNIA; N/A ?    Comment:  Procedure: XI ROBOTIC ASSISTED VENTRAL HERNIA,  ?             incisional hernia;  Surgeon: Leafy Ro, MD;   ?             Location: [facility] ORS;  Service: General;  Laterality: N/A; ?BMI   ? Body Mass Index: 34.46 kg/m?  ?  ? Reproductive/Obstetrics ?negative OB ROS ? ?  ? ? ? ? ? ? ? ? ? ? ? ? ? ?  ?  ? ? ? ? ? ? ? ? ?Anesthesia Physical ?Anesthesia Plan ? ?ASA: 3 ? ?Anesthesia Plan: General  ? ?Post-op Pain Management:   ? ?Induction:  ? ?PONV Risk Score and Plan:  ? ?Airway Management Planned:  ? ?Additional  Equipment:  ? ?Intra-op Plan:  ? ?Post-operative Plan:  ? ?Informed Consent: I have reviewed the patients History and Physical, chart, labs and discussed the procedure including the risks, benefits and alternatives for the proposed anesthesia with the patient or authorized representative who has indicated his/her understanding and acceptance.  ? ? ? ?Dental Advisory Given ? ?Plan Discussed with: CRNA ? ?Anesthesia Plan Comments:   ? ? ? ? ? ? ?Anesthesia Quick Evaluation ? ?

## 2021-06-19 NOTE — Op Note (Signed)
[facility] ?Gastroenterology ?Patient Name: Tammy Decker ?Procedure Date: 06/19/2021 9:17 AM ?MRN: [id number] ?Account #: [id number] ?Date of Birth: 04-Feb-1971 ?Admit Type: Outpatient ?Age: 51 ?Room: [facility] ENDO ROOM 3 ?Gender: Female ?Note Status: Finalized ?Instrument Name: Upper Endoscope [id number] ?Procedure:             Upper GI endoscopy ?Indications:           Upper abdominal pain, Heartburn, Abdominal bloating ?Providers:             Lin Landsman MD, MD ?Referring MD:          Forest Gleason Md, MD (Referring MD) ?Medicines:             General Anesthesia ?Complications:         No immediate complications. Estimated blood loss: None. ?Procedure:             Pre-Anesthesia Assessment: ?                       - Prior to the procedure, a History and Physical was  ?                       performed, and patient medications and allergies were  ?                       reviewed. The patient is competent. The risks and  ?                       benefits of the procedure and the sedation options and  ?                       risks were discussed with the patient. All questions  ?                       were answered and informed consent was obtained.  ?                       Patient identification and proposed procedure were  ?                       verified by the physician, the nurse, the  ?                       anesthesiologist, the anesthetist and the technician  ?                       in the pre-procedure area in the procedure room in the  ?                       endoscopy suite. Mental Status Examination: alert and  ?                       oriented. Airway Examination: normal oropharyngeal  ?                       airway and neck mobility. Respiratory Examination:  ?                       clear to auscultation. CV Examination: normal.  ?  Prophylactic Antibiotics: The patient does not require  ?                       prophylactic antibiotics. Prior Anticoagulants: The  ?                        patient has taken no previous anticoagulant or  ?                       antiplatelet agents. ASA Grade Assessment: II - A  ?                       patient with mild systemic disease. After reviewing  ?                       the risks and benefits, the patient was deemed in  ?                       satisfactory condition to undergo the procedure. The  ?                       anesthesia plan was to use general anesthesia.  ?                       Immediately prior to administration of medications,  ?                       the patient was re-assessed for adequacy to receive  ?                       sedatives. The heart rate, respiratory rate, oxygen  ?                       saturations, blood pressure, adequacy of pulmonary  ?                       ventilation, and response to care were monitored  ?                       throughout the procedure. The physical status of the  ?                       patient was re-assessed after the procedure. ?                       After obtaining informed consent, the endoscope was  ?                       passed under direct vision. Throughout the procedure,  ?                       the patient's blood pressure, pulse, and oxygen  ?                       saturations were monitored continuously. The Endoscope  ?                       was introduced through the mouth, and advanced to the  ?  second part of duodenum. The upper GI endoscopy was  ?                       accomplished without difficulty. The patient tolerated  ?                       the procedure well. ?Findings: ?     The duodenal bulb and second portion of the duodenum were normal.  ?     Biopsies for histology were taken with a cold forceps for evaluation of  ?     celiac disease. ?     Diffuse mildly erythematous mucosa without bleeding was found in the  ?     gastric body and in the gastric antrum. Biopsies were taken with a cold  ?     forceps for Helicobacter pylori testing. ?      The cardia and gastric fundus were normal on retroflexion. ?     Esophagogastric landmarks were identified: the gastroesophageal junction  ?     was found at 38 cm from the incisors. ?     Diffuse, white plaques were found in the middle third of the esophagus  ?     and in the lower third of the esophagus. Biopsies were taken with a cold  ?     forceps for histology. ?Impression:            - Normal duodenal bulb and second portion of the  ?                       duodenum. Biopsied. ?                       - Erythematous mucosa in the gastric body and antrum.  ?                       Biopsied. ?                       - Esophagogastric landmarks identified. ?                       - Esophageal plaques were found, suspicious for  ?                       candidiasis. Biopsied. ?Recommendation:        - Await pathology results. ?                       - Discharge patient to home (with escort). ?                       - Resume previous diet today. ?                       - Continue present medications. ?                       - Return to my office in 4 weeks. ?Procedure Code(s):     --- Professional --- ?                       [phone], Esophagogastroduodenoscopy, flexible,  ?  transoral; with biopsy, single or multiple ?Diagnosis Code(s):     --- Professional --- ?                       K31.89, Other diseases of stomach and duodenum ?                       K22.9, Disease of esophagus, unspecified ?                       R10.10, Upper abdominal pain, unspecified ?                       R14.0, Abdominal distension (gaseous) ?                       R12, Heartburn ?CPT copyright 2019 American Medical Association. All rights reserved. ?The codes documented in this report are preliminary and upon coder review may  ?be revised to meet current compliance requirements. ?Dr. Ulyess Mort ?Machael Raine Raeanne Gathers MD, MD ?06/19/2021 9:44:18 AM ?This report has been signed electronically. ?Number of Addenda: 0 ?Note  Initiated On: 06/19/2021 9:17 AM ?Estimated Blood Loss:  Estimated blood loss: none. ?     [facility] ?

## 2021-06-19 NOTE — H&P (Signed)
?Arlyss Repress, MD ?[address]  ?[address]  ?[place], [place] [address]  ?Main: [phone]  ?Fax: [phone] ?Pager: [phone] ? ?Primary Care Physician:  Pcp, No ?Primary Gastroenterologist:  Dr. Arlyss Repress ? ?Pre-Procedure History & Physical: ?HPI:  Tammy Decker is a 51 y.o. female is here for an endoscopy. ?  ?Past Medical History:  ?Diagnosis Date  ? Asthma   ? Complication of anesthesia   ? OXYGEN SOMETIMES DROPS LOW  ? GERD (gastroesophageal reflux disease)   ? Hypertension   ? Tachycardia 2020  ? ? ?Past Surgical History:  ?Procedure Laterality Date  ? BACK SURGERY    ? CHOLECYSTECTOMY    ? NECK SURGERY    ? uterine ablation    ? XI ROBOTIC ASSISTED VENTRAL HERNIA N/A 05/09/2020  ? Procedure: XI ROBOTIC ASSISTED VENTRAL HERNIA, incisional hernia;  Surgeon: Leafy Ro, MD;  Location: [facility] ORS;  Service: General;  Laterality: N/A;  ? ? ?Prior to Admission medications   ?Medication Sig Start Date End Date Taking? Authorizing Provider  ?famotidine (PEPCID) 20 MG tablet Take 20 mg by mouth 2 (two) times daily.   Yes [provider]  ?cyclobenzaprine (FLEXERIL) 5 MG tablet Take 1 tablet (5 mg total) by mouth every 8 (eight) hours as needed for muscle spasms. 05/23/21   Leafy Ro, MD  ? ? ?Allergies as of 05/29/2021 - Review Complete 05/28/2021  ?Allergen Reaction Noted  ? Ciprofloxacin Anaphylaxis 02/27/2019  ? Kiwi extract Anaphylaxis 02/24/2021  ? Sulfa antibiotics Other (See Comments) 04/27/2020  ? ? ?Family History  ?Problem Relation Age of Onset  ? Diabetes Mother   ? Diabetes Father   ? ? ?Social History  ? ?Socioeconomic History  ? Marital status: Married  ?  Spouse name: Not on file  ? Number of children: Not on file  ? Years of education: Not on file  ? Highest education level: Not on file  ?Occupational History  ? Not on file  ?Tobacco Use  ? Smoking status: Every Day  ?  Packs/day: 0.15  ?  Types: Cigarettes  ? Smokeless tobacco: Never  ? Tobacco comments:  ?   down to 3 cigs per day  ?Vaping Use  ? Vaping Use: Never used  ?Substance and Sexual Activity  ? Alcohol use: Yes  ?  Comment: occasionally  ? Drug use: Never  ? Sexual activity: Not on file  ?Other Topics Concern  ? Not on file  ?Social History Narrative  ? Not on file  ? ?Social Determinants of Health  ? ?Financial Resource Strain: Not on file  ?Food Insecurity: Not on file  ?Transportation Needs: Not on file  ?Physical Activity: Not on file  ?Stress: Not on file  ?Social Connections: Not on file  ?Intimate Partner Violence: Not on file  ? ? ?Review of Systems: ?See HPI, otherwise negative ROS ? ?Physical Exam: ?BP (!) 143/97   Temp 97.7 ?F (36.5 ?C) (Temporal)   Resp 17   Ht 5\' 7"  (1.702 m)   Wt 99.8 kg   SpO2 98%   BMI 34.46 kg/m?  ?General:   Alert,  pleasant and cooperative in NAD ?Head:  Normocephalic and atraumatic. ?Neck:  Supple; no masses or thyromegaly. ?Lungs:  Clear throughout to auscultation.    ?Heart:  Regular rate and rhythm. ?Abdomen:  Soft, nontender and nondistended. Normal bowel sounds, without guarding, and without rebound.   ?Neurologic:  Alert and  oriented x4;  grossly normal neurologically. ? ?  Impression/Plan: ?Tammy Decker is here for an endoscopy to be performed for upper abdominal pain, bloating, nausea, loose stools ? ?Risks, benefits, limitations, and alternatives regarding  endoscopy have been reviewed with the patient.  Questions have been answered.  All parties agreeable. ? ? ?Lannette Donath, MD  06/19/2021, 9:15 AM ?

## 2021-06-19 NOTE — Transfer of Care (Signed)
Immediate Anesthesia Transfer of Care Note ? ?Patient: Tammy Decker ? ?Procedure(s) Performed: ESOPHAGOGASTRODUODENOSCOPY (EGD) WITH PROPOFOL ? ?Patient Location: PACU ? ?Anesthesia Type:General ? ?Level of Consciousness: awake and alert  ? ?Airway & Oxygen Therapy: Patient Spontanous Breathing ? ?Post-op Assessment: Report given to RN and Post -op Vital signs reviewed and stable ? ?Post vital signs: Reviewed and stable ? ?Last Vitals:  ?Vitals Value Taken Time  ?BP 122/86 06/19/21 0947  ?Temp 36.2 ?C 06/19/21 0947  ?Pulse 101 06/19/21 0947  ?Resp 20 06/19/21 0947  ?SpO2 98 % 06/19/21 0947  ? ? ?Last Pain:  ?Vitals:  ? 06/19/21 0947  ?TempSrc: Temporal  ?PainSc:   ?   ? ?  ? ?Complications: No notable events documented. ?

## 2021-06-20 ENCOUNTER — Encounter: Payer: Self-pay | Admitting: Gastroenterology

## 2021-06-21 LAB — SURGICAL PATHOLOGY

## 2021-06-25 ENCOUNTER — Ambulatory Visit: Payer: No Typology Code available for payment source | Admitting: Surgery

## 2021-06-27 ENCOUNTER — Telehealth: Payer: Self-pay

## 2021-06-27 NOTE — Telephone Encounter (Signed)
On your EGD report you said for her to follow up in 4 weeks. Did you want her to be seen in the office or through mychart. Is it okay to overbook if so  ?

## 2021-06-27 NOTE — Telephone Encounter (Signed)
Yes, okay to overbook if patient is agreeable for a visit ? ?Please go over these recommendations with the patient ? ?The pathology results from upper endoscopy came back normal.  I recommend trial of Prilosec 40 mg p.o. twice daily before meals for 1 month.  Also, she would need a colonoscopy.  If she is still experiencing abdominal bloating,  I can send in a short course of antibiotics ?  ?Let me know ?  ?Tammy Decker ? ?

## 2021-06-28 MED ORDER — OMEPRAZOLE 40 MG PO CPDR: 40.0000 mg | DELAYED_RELEASE_CAPSULE | Freq: Two times a day (BID) | ORAL | 0 refills | Status: DC

## 2021-06-28 MED ORDER — RIFAXIMIN 550 MG PO TABS: 550.0000 mg | ORAL_TABLET | Freq: Three times a day (TID) | ORAL | 0 refills | Status: AC

## 2021-06-28 NOTE — Addendum Note (Signed)
Addended by: Radene Knee L on: 06/28/2021 08:57 AM ? ? Modules accepted: Orders ? ?

## 2021-06-28 NOTE — Telephone Encounter (Signed)
Patient verbalized understanding. She states she is still having the abdominal bloating and would like the antibiotics called in for her. Is this the Xifaxan 3 times a day for 14 days. Sent omeprazole to the pharmacy and made follow up appointment  ?

## 2021-06-28 NOTE — Addendum Note (Signed)
Addended by: Radene Knee L on: 06/28/2021 09:06 AM ? ? Modules accepted: Orders ? ?

## 2021-07-02 ENCOUNTER — Ambulatory Visit (INDEPENDENT_AMBULATORY_CARE_PROVIDER_SITE_OTHER): Payer: No Typology Code available for payment source | Admitting: Surgery

## 2021-07-02 ENCOUNTER — Telehealth: Payer: Self-pay

## 2021-07-02 ENCOUNTER — Encounter: Payer: Self-pay | Admitting: Surgery

## 2021-07-02 VITALS — BP 134/96 | HR 120 | Temp 98.0°F | Ht 67.0 in | Wt 228.0 lb

## 2021-07-02 DIAGNOSIS — R1084 Generalized abdominal pain: Secondary | ICD-10-CM | POA: Diagnosis not present

## 2021-07-02 MED ORDER — DOXYCYCLINE HYCLATE 100 MG PO CAPS: 100.0000 mg | ORAL_CAPSULE | Freq: Two times a day (BID) | ORAL | 0 refills | Status: DC

## 2021-07-02 NOTE — Addendum Note (Signed)
Addended by: Radene Knee L on: 07/02/2021 03:27 PM ? ? Modules accepted: Orders ? ?

## 2021-07-02 NOTE — Telephone Encounter (Signed)
The [name] is going to cost patient 2700 dollars. Can this be change to something else  ?

## 2021-07-02 NOTE — Patient Instructions (Addendum)
Follow up here after you are seen at [facility]. We will call you once July opens for scheduling.  ? ? ? ?Please call and ask to speak with a nurse if you develop questions or concerns. ? ?

## 2021-07-02 NOTE — Telephone Encounter (Signed)
Can do doxycycline 100 mg p.o. twice daily for 2 weeks ? ?RV ?

## 2021-07-02 NOTE — Telephone Encounter (Signed)
Called informed patient of this information and sent medication to the pharmacy  ?

## 2021-07-03 NOTE — Progress Notes (Signed)
Outpatient Surgical Follow Up ? ?07/03/2021 ? ?Tammy Decker is an 51 y.o. female.  ? ?Chief Complaint  ?Patient presents with  ? Follow-up  ? ? ?HPI: 51 year old female following up for abdominal discomfort and bulging of the abdominal wall.  She did have robotic ventral hernia repair by me that I placed in the retrorectus space 05/09/20.  She comes in with a bulge and abdominal discomfort.   ?Continues to endorse chronic abdominal pain but seem to be better controlled this time. ?He has completed an endoscopy showing reflux and some possible white plaques within the esophagus.  She has been seeing Dr. Allegra Lai and she has received treatment appropriately.  There is also a degree of IBS and she is trying to get some medicines for that ? ?Past Medical History:  ?Diagnosis Date  ? Asthma   ? Complication of anesthesia   ? OXYGEN SOMETIMES DROPS LOW  ? GERD (gastroesophageal reflux disease)   ? Hypertension   ? Tachycardia 2020  ? ? ?Past Surgical History:  ?Procedure Laterality Date  ? BACK SURGERY    ? CHOLECYSTECTOMY    ? ESOPHAGOGASTRODUODENOSCOPY (EGD) WITH PROPOFOL N/A 06/19/2021  ? Procedure: ESOPHAGOGASTRODUODENOSCOPY (EGD) WITH PROPOFOL;  Surgeon: Toney Reil, MD;  Location: [facility] ENDOSCOPY;  Service: Gastroenterology;  Laterality: N/A;  ? NECK SURGERY    ? uterine ablation    ? XI ROBOTIC ASSISTED VENTRAL HERNIA N/A 05/09/2020  ? Procedure: XI ROBOTIC ASSISTED VENTRAL HERNIA, incisional hernia;  Surgeon: Leafy Ro, MD;  Location: [facility] ORS;  Service: General;  Laterality: N/A;  ? ? ?Family History  ?Problem Relation Age of Onset  ? Diabetes Mother   ? Diabetes Father   ? ? ?Social History:  reports that she has been smoking cigarettes. She has been smoking an average of .15 packs per day. She has never used smokeless tobacco. She reports current alcohol use. She reports that she does not use drugs. ? ?Allergies:  ?Allergies  ?Allergen Reactions  ? Ciprofloxacin Anaphylaxis  ? Kiwi Extract Anaphylaxis   ? Sulfa Antibiotics Other (See Comments)  ? ? ?Medications reviewed. ? ? ? ?ROS ?Full ROS performed and is otherwise negative other than what is stated in HPI ? ? ?BP (!) 134/96   Pulse (!) 120   Temp 98 ?F (36.7 ?C)   Ht 5\' 7"  (1.702 m)   Wt 228 lb (103.4 kg)   SpO2 98%   BMI 35.71 kg/m?  ? ?Physical Exam ?Vitals and nursing note reviewed. Exam conducted with a chaperone present.  ?Constitutional:   ?   General: She is not in acute distress. ?   Appearance: Normal appearance. She is not ill-appearing or toxic-appearing.  ?Abdominal:  ?   There is evidence of prior robotic scars without true hernia defects.    There is no evidence of cellulitis there is no evidence of peritonitis is mildly diffusely tender to palpation. ?Skin: ?   General: Skin is warm and dry.  ?   Capillary Refill: Capillary refill takes less than 2 seconds.  ?Neurological:  ?   General: No focal deficit present.  ?   Mental Status: She is alert and oriented to person, place, and time.  ?Psychiatric:     ?   Mood and Affect: Mood normal.     ?   Behavior: Behavior normal.     ?   Thought Content: Thought content normal.     ?   Judgment: Judgment normal.  ? ?Assessment/Plan: ?51 year old  female with chronic abdominal pain and some diastases recti.  She does have a pending appointment with [facility] Dr. Derrell Lolling next month.  She also has a pending appointment with rheumatology and follow-up with GI.  At this time no need for hospitalization or any surgical intervention. ?Please note that I spent over 20 minutes in this encounter including personally reviewing records, counseling the patient and performing appropriate documentation. ? ? ?Sterling Big, MD FACS ?General Surgeon  ?

## 2021-07-20 ENCOUNTER — Telehealth: Payer: Self-pay | Admitting: *Deleted

## 2021-07-20 ENCOUNTER — Other Ambulatory Visit: Payer: Self-pay

## 2021-07-20 DIAGNOSIS — R1084 Generalized abdominal pain: Secondary | ICD-10-CM

## 2021-07-20 NOTE — Telephone Encounter (Signed)
Patient called and wanted to see if we can send a referral to a outpatient physical therapy to help strengthen her abdominal core. She is a patient of Dr Herma Mering

## 2021-07-20 NOTE — Progress Notes (Unsigned)
ot

## 2021-07-21 ENCOUNTER — Telehealth: Payer: PRIVATE HEALTH INSURANCE | Admitting: Nurse Practitioner

## 2021-07-21 DIAGNOSIS — N3000 Acute cystitis without hematuria: Secondary | ICD-10-CM | POA: Diagnosis not present

## 2021-07-21 MED ORDER — PHENAZOPYRIDINE HCL 200 MG PO TABS: 200.0000 mg | ORAL_TABLET | Freq: Three times a day (TID) | ORAL | 0 refills | Status: DC | PRN

## 2021-07-21 MED ORDER — NITROFURANTOIN MONOHYD MACRO 100 MG PO CAPS: 100.0000 mg | ORAL_CAPSULE | Freq: Two times a day (BID) | ORAL | 0 refills | Status: AC

## 2021-07-21 NOTE — Patient Instructions (Signed)
  Tammy Decker, thank you for joining Claiborne Rigg, NP for today's virtual visit.  While this provider is not your primary care provider (PCP), if your PCP is located in our provider database this encounter information will be shared with them immediately following your visit.  Consent: (Patient) Tammy Decker provided verbal consent for this virtual visit at the beginning of the encounter.  Current Medications:  Current Outpatient Medications:    nitrofurantoin, macrocrystal-monohydrate, (MACROBID) 100 MG capsule, Take 1 capsule (100 mg total) by mouth 2 (two) times daily for 5 days., Disp: 10 capsule, Rfl: 0   phenazopyridine (PYRIDIUM) 200 MG tablet, Take 1 tablet (200 mg total) by mouth 3 (three) times daily as needed for pain., Disp: 6 tablet, Rfl: 0   cyclobenzaprine (FLEXERIL) 5 MG tablet, Take 1 tablet (5 mg total) by mouth every 8 (eight) hours as needed for muscle spasms., Disp: 20 tablet, Rfl: 0   doxycycline (VIBRAMYCIN) 100 MG capsule, Take 1 capsule (100 mg total) by mouth 2 (two) times daily., Disp: 28 capsule, Rfl: 0   omeprazole (PRILOSEC) 40 MG capsule, Take 1 capsule (40 mg total) by mouth 2 (two) times daily before a meal. (Patient taking differently: Take 40 mg by mouth 3 (three) times daily.), Disp: 60 capsule, Rfl: 0   Medications ordered in this encounter:  Meds ordered this encounter  Medications   phenazopyridine (PYRIDIUM) 200 MG tablet    Sig: Take 1 tablet (200 mg total) by mouth 3 (three) times daily as needed for pain.    Dispense:  6 tablet    Refill:  0    Order Specific Question:   Supervising Provider    Answer:   MILLER, BRIAN [[id number]]   nitrofurantoin, macrocrystal-monohydrate, (MACROBID) 100 MG capsule    Sig: Take 1 capsule (100 mg total) by mouth 2 (two) times daily for 5 days.    Dispense:  10 capsule    Refill:  0    Order Specific Question:   Supervising Provider    Answer:   Hyacinth Meeker, BRIAN [[id number]]     *If you need refills on  other medications prior to your next appointment, please contact your pharmacy*  Follow-Up: Call back or seek an in-person evaluation if the symptoms worsen or if the condition fails to improve as anticipated.  Other Instructions Remember to always wipe from back to front after urination   If you have been instructed to have an in-person evaluation today at a local Urgent Care facility, please use the link below. It will take you to a list of all of our available [facility], including address, phone number and hours of operation. Please do not delay care.  [facility]  If you or a family member do not have a primary care provider, use the link below to schedule a visit and establish care. When you choose a [facility] primary care physician or advanced practice provider, you gain a long-term partner in health. Find a Primary Care Provider  Learn more about [facility]'s in-office and virtual care options:  - Get Care Now
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
## 2021-07-31 ENCOUNTER — Ambulatory Visit: Payer: PRIVATE HEALTH INSURANCE | Admitting: Gastroenterology

## 2021-08-01 ENCOUNTER — Telehealth: Payer: Self-pay | Admitting: *Deleted

## 2021-08-01 NOTE — Telephone Encounter (Signed)
Letter written for full clearance from surgery. This has been sent to her MyChart.

## 2021-08-01 NOTE — Telephone Encounter (Signed)
Patient had surgery over a year ago with Dr Everlene Farrier and she stated that she is starting her second job again and they are just requesting a current note stating that she is able to work with no restrictions. She would like for it to be uploaded on mychart. Please call and advise

## 2021-09-10 ENCOUNTER — Ambulatory Visit: Payer: No Typology Code available for payment source | Admitting: Surgery

## 2021-09-18 ENCOUNTER — Ambulatory Visit: Payer: Self-pay | Admitting: Gastroenterology

## 2021-10-30 ENCOUNTER — Telehealth: Payer: Self-pay | Admitting: Gastroenterology

## 2021-10-30 NOTE — Telephone Encounter (Signed)
Please call pt she thinks her stomach need [phone]. Pt states she can't wait until Jan.2024

## 2021-10-30 NOTE — Telephone Encounter (Signed)
Patient had appointment schedule for 07/31/2021 and no showed the appointment. Patient is a having abdominal bloating, and discomfort. She is having a hard time breathing and walking a long distance. She states she knows it is coming from her stomach because she is a Engineer, civil (consulting). She states she has not slept good the last 2 nights because of this . Denies any and constipation, diarrhea, or Nausea.

## 2021-10-30 NOTE — Telephone Encounter (Signed)
Patient declined to make appointment in January. She states that is stupid and she will not wait that long till her appointment when she is having a problem. She states she is not due for a colonoscopy because she had one 2 years ago before her hernia surgery. She states she will go see her PCP and get them to refer her some where else

## 2021-10-30 NOTE — Telephone Encounter (Signed)
She needs a colonoscopy, schedule it as colon cancer screening if patient is agreeable I can only see her as next available, you can keep her on priority list  RV

## 2021-11-12 ENCOUNTER — Telehealth: Payer: Self-pay

## 2021-11-12 ENCOUNTER — Ambulatory Visit (INDEPENDENT_AMBULATORY_CARE_PROVIDER_SITE_OTHER): Payer: No Typology Code available for payment source | Admitting: Surgery

## 2021-11-12 ENCOUNTER — Encounter: Payer: Self-pay | Admitting: Surgery

## 2021-11-12 VITALS — BP 151/102 | HR 99 | Temp 98.0°F | Ht 67.0 in | Wt 228.0 lb

## 2021-11-12 DIAGNOSIS — R140 Abdominal distension (gaseous): Secondary | ICD-10-CM | POA: Diagnosis not present

## 2021-11-12 DIAGNOSIS — K760 Fatty (change of) liver, not elsewhere classified: Secondary | ICD-10-CM | POA: Diagnosis not present

## 2021-11-12 NOTE — Telephone Encounter (Signed)
Call to the patient to go over her ultrasound exam. The patient is scheduled for an ultrasound at [facility] on 11/15/21 at 8:00 am. She will need to arrive there by 7:45 am and have nothing to eat or drink after midnight the night prior. The patient is aware of date, time, and instructions.

## 2021-11-12 NOTE — Patient Instructions (Signed)
We have ordered labs to be done at [facility] entrance any time for you that is good.  We will call you about your Ultrasound.    Follow up in 3 months.

## 2021-11-13 ENCOUNTER — Encounter: Payer: Self-pay | Admitting: Surgery

## 2021-11-13 NOTE — Progress Notes (Signed)
Outpatient Surgical Follow Up  11/13/2021  Tammy Decker is an 51 y.o. female.   Chief Complaint  Patient presents with   Follow-up   HPI:  51 year old female following up for abdominal discomfort and bulging of the abdominal wall.  She did have robotic ventral hernia repair by me that I placed in the retrorectus space 05/09/20.  She comes in with a bulge and abdominal discomfort.   Continues to endorse chronic abdominal pain but seem to be better controlled this time. SHe has completed an endoscopy showing reflux and some possible white plaques within the esophagus.  She has been seeing Dr. Allegra Lai and she has received treatment appropriately.    I have made arrangements for her to see Dr. Carlynn Purl at [facility] and one of the experts of abdominal wall reconstructions in the country.  Unfortunately she could not go to that appointment because there were pressing priorities at her job. He does admit significant alcohol issues and she is seeking help.  She feels swollen in her abdomen and legs.   Past Medical History:  Diagnosis Date   Asthma    Complication of anesthesia    OXYGEN SOMETIMES DROPS LOW   GERD (gastroesophageal reflux disease)    Hypertension    Tachycardia 2020    Past Surgical History:  Procedure Laterality Date   BACK SURGERY     CHOLECYSTECTOMY     ESOPHAGOGASTRODUODENOSCOPY (EGD) WITH PROPOFOL N/A 06/19/2021   Procedure: ESOPHAGOGASTRODUODENOSCOPY (EGD) WITH PROPOFOL;  Surgeon: Toney Reil, MD;  Location: [facility];  Service: Gastroenterology;  Laterality: N/A;   NECK SURGERY     uterine ablation     XI ROBOTIC ASSISTED VENTRAL HERNIA N/A 05/09/2020   Procedure: XI ROBOTIC ASSISTED VENTRAL HERNIA, incisional hernia;  Surgeon: Leafy Ro, MD;  Location: [facility] ORS;  Service: General;  Laterality: N/A;    Family History  Problem Relation Age of Onset   Diabetes Mother    Diabetes Father     Social History:  reports that she has been smoking  cigarettes. She has been smoking an average of .15 packs per day. She has been exposed to tobacco smoke. She has never used smokeless tobacco. She reports current alcohol use. She reports that she does not use drugs.  Allergies:  Allergies  Allergen Reactions   Ciprofloxacin Anaphylaxis   Kiwi Extract Anaphylaxis   Sulfa Antibiotics Hives    Medications reviewed.    ROS Full ROS performed and is otherwise negative other than what is stated in HPI   BP (!) 151/102   Pulse 99   Temp 98 F (36.7 C)   Ht 5\' 7"  (1.702 m)   Wt 228 lb (103.4 kg)   SpO2 97%   BMI 35.71 kg/m   Physical Exam Physical Exam Vitals and nursing note reviewed. Exam conducted with a chaperone present.  Constitutional:      General: She is not in acute distress.    Appearance: Normal appearance. She is not ill-appearing or toxic-appearing.  Abdominal:     There is evidence of prior robotic scars without true hernia defects.    There is no evidence of cellulitis there is no evidence of peritonitis is mildly diffusely tender to palpation. Skin:    General: Skin is warm and dry.     Capillary Refill: Capillary refill takes less than 2 seconds.  Neurological:     General: No focal deficit present.     Mental Status: She is alert and oriented to person,  place, and time.  Psychiatric:        Mood and Affect: Mood normal.        Behavior: Behavior normal.        Thought Content: Thought content normal.        Judgment: Judgment normal.      Assessment/Plan: Abdominal pain.  Alcohol abuse and questionable hepatic changes.  We will start work-up with a CBC CMP as well as an INR and ultrasound of the abdomen to assess for any ascites or cirrhosis. Does have a follow-up appointment with GI and they will continue work-up. I did encourage her to seek primary care opinion regarding all her medical conditions.  At this time I do not see evidence of hernia infections recurrences or complications related to her  hernia that would warrant any surgical intervention. Please note that I spent  40 minutes in this encounter including personally reviewing records, counseling the patient and performing appropriate documentation.    Sterling Big, MD [facility] General Surgeon

## 2021-11-15 ENCOUNTER — Other Ambulatory Visit
Admission: RE | Admit: 2021-11-15 | Discharge: 2021-11-15 | Disposition: A | Discharge status: Home / Self Care | Payer: PRIVATE HEALTH INSURANCE | Source: Home / Self Care | Attending: Surgery | Admitting: Surgery

## 2021-11-15 ENCOUNTER — Ambulatory Visit
Admission: RE | Admit: 2021-11-15 | Discharge: 2021-11-15 | Disposition: A | Discharge status: Home / Self Care | Payer: PRIVATE HEALTH INSURANCE | Source: Ambulatory Visit | Attending: Surgery | Admitting: Surgery

## 2021-11-15 DIAGNOSIS — K760 Fatty (change of) liver, not elsewhere classified: Secondary | ICD-10-CM

## 2021-11-15 DIAGNOSIS — R140 Abdominal distension (gaseous): Secondary | ICD-10-CM | POA: Insufficient documentation

## 2021-11-15 LAB — CBC WITH DIFFERENTIAL/PLATELET
Abs Immature Granulocytes: 0.02 10*3/uL (ref 0.00–0.07)
Basophils Absolute: 0.1 10*3/uL (ref 0.0–0.1)
Basophils Relative: 1 %
Eosinophils Absolute: 0.1 10*3/uL (ref 0.0–0.5)
Eosinophils Relative: 2 %
HCT: 46.5 % — ABNORMAL HIGH (ref 36.0–46.0)
Hemoglobin: 15.5 g/dL — ABNORMAL HIGH (ref 12.0–15.0)
Immature Granulocytes: 0 %
Lymphocytes Relative: 29 %
Lymphs Abs: 2.2 10*3/uL (ref 0.7–4.0)
MCH: 33.9 pg (ref 26.0–34.0)
MCHC: 33.3 g/dL (ref 30.0–36.0)
MCV: 101.8 fL — ABNORMAL HIGH (ref 80.0–100.0)
Monocytes Absolute: 0.6 10*3/uL (ref 0.1–1.0)
Monocytes Relative: 8 %
Neutro Abs: 4.5 10*3/uL (ref 1.7–7.7)
Neutrophils Relative %: 60 %
Platelets: 265 10*3/uL (ref 150–400)
RBC: 4.57 MIL/uL (ref 3.87–5.11)
RDW: 13.8 % (ref 11.5–15.5)
WBC: 7.5 10*3/uL (ref 4.0–10.5)
nRBC: 0 % (ref 0.0–0.2)

## 2021-11-15 LAB — COMPREHENSIVE METABOLIC PANEL
ALT: 88 U/L — ABNORMAL HIGH (ref 0–44)
AST: 115 U/L — ABNORMAL HIGH (ref 15–41)
Albumin: 3.7 g/dL (ref 3.5–5.0)
Alkaline Phosphatase: 91 U/L (ref 38–126)
Anion gap: 7 (ref 5–15)
BUN: 6 mg/dL (ref 6–20)
CO2: 24 mmol/L (ref 22–32)
Calcium: 8.5 mg/dL — ABNORMAL LOW (ref 8.9–10.3)
Chloride: 108 mmol/L (ref 98–111)
Creatinine, Ser: 0.58 mg/dL (ref 0.44–1.00)
GFR, Estimated: >60 mL/min (ref >60)
Glucose, Bld: 133 mg/dL — ABNORMAL HIGH (ref 70–99)
Potassium: 4.2 mmol/L (ref 3.5–5.1)
Sodium: 139 mmol/L (ref 135–145)
Total Bilirubin: 0.6 mg/dL (ref 0.3–1.2)
Total Protein: 7.8 g/dL (ref 6.5–8.1)

## 2021-11-15 LAB — PHOSPHORUS: Phosphorus: 2.8 mg/dL (ref 2.5–4.6)

## 2021-11-15 LAB — MAGNESIUM: Magnesium: 2.1 mg/dL (ref 1.7–2.4)

## 2021-11-15 LAB — PROTIME-INR
INR: 1 (ref 0.8–1.2)
Prothrombin Time: 13.5 seconds (ref 11.4–15.2)

## 2021-11-19 ENCOUNTER — Telehealth: Payer: Self-pay

## 2021-11-19 NOTE — Telephone Encounter (Signed)
Patient notified of lab and ultrasound results. Follow up as scheduled.

## 2021-11-19 NOTE — Telephone Encounter (Signed)
-----   Message from Jules Husbands, MD sent at 11/16/2021 12:34 PM EDT ----- Please let her know, u/s show fatty liver but no other surprises ----- Message ----- From: Interface, Rad Results In Sent: 11/16/2021  11:42 AM EDT To: Jules Husbands, MD

## 2021-12-07 ENCOUNTER — Ambulatory Visit (INDEPENDENT_AMBULATORY_CARE_PROVIDER_SITE_OTHER): Payer: No Typology Code available for payment source | Admitting: Family

## 2021-12-07 ENCOUNTER — Encounter: Payer: Self-pay | Admitting: Family

## 2021-12-07 VITALS — BP 120/76 | HR 108 | Temp 98.4°F | Resp 16 | Ht 67.0 in | Wt 240.2 lb

## 2021-12-07 DIAGNOSIS — Z8719 Personal history of other diseases of the digestive system: Secondary | ICD-10-CM | POA: Diagnosis not present

## 2021-12-07 DIAGNOSIS — R7989 Other specified abnormal findings of blood chemistry: Secondary | ICD-10-CM

## 2021-12-07 DIAGNOSIS — Z8619 Personal history of other infectious and parasitic diseases: Secondary | ICD-10-CM | POA: Insufficient documentation

## 2021-12-07 DIAGNOSIS — F1010 Alcohol abuse, uncomplicated: Secondary | ICD-10-CM

## 2021-12-07 DIAGNOSIS — I10 Essential (primary) hypertension: Secondary | ICD-10-CM | POA: Diagnosis not present

## 2021-12-07 DIAGNOSIS — K219 Gastro-esophageal reflux disease without esophagitis: Secondary | ICD-10-CM

## 2021-12-07 DIAGNOSIS — F32A Depression, unspecified: Secondary | ICD-10-CM | POA: Insufficient documentation

## 2021-12-07 DIAGNOSIS — R350 Frequency of micturition: Secondary | ICD-10-CM | POA: Diagnosis not present

## 2021-12-07 DIAGNOSIS — R000 Tachycardia, unspecified: Secondary | ICD-10-CM | POA: Insufficient documentation

## 2021-12-07 DIAGNOSIS — Z720 Tobacco use: Secondary | ICD-10-CM | POA: Insufficient documentation

## 2021-12-07 DIAGNOSIS — L750 Bromhidrosis: Secondary | ICD-10-CM

## 2021-12-07 DIAGNOSIS — R739 Hyperglycemia, unspecified: Secondary | ICD-10-CM

## 2021-12-07 DIAGNOSIS — F419 Anxiety disorder, unspecified: Secondary | ICD-10-CM

## 2021-12-07 DIAGNOSIS — D582 Other hemoglobinopathies: Secondary | ICD-10-CM

## 2021-12-07 DIAGNOSIS — Z780 Asymptomatic menopausal state: Secondary | ICD-10-CM

## 2021-12-07 LAB — POC URINALSYSI DIPSTICK (AUTOMATED)
Bilirubin, UA: NEGATIVE
Glucose, UA: NEGATIVE
Ketones, UA: NEGATIVE
Nitrite, UA: POSITIVE
Protein, UA: POSITIVE — AB
Spec Grav, UA: 1.02 (ref 1.010–1.025)
Urobilinogen, UA: 0.2 E.U./dL
pH, UA: 6 (ref 5.0–8.0)

## 2021-12-07 MED ORDER — OMEPRAZOLE 40 MG PO CPDR: 40.0000 mg | DELAYED_RELEASE_CAPSULE | Freq: Every day | ORAL | 0 refills | Status: DC

## 2021-12-07 MED ORDER — LOSARTAN POTASSIUM 100 MG PO TABS: 100.0000 mg | ORAL_TABLET | Freq: Every day | ORAL | 3 refills | Status: DC

## 2021-12-07 NOTE — Patient Instructions (Addendum)
  Call GI Dr. Marius Ditch and talk about colonscopy because still with upper abd pain and distention.   A referral was placed today Education officer, [employer].  Please let us know if you have not heard back within 2 weeks about the referral.  Welcome to our clinic, I am happy to have you as my new patient. I am excited to continue on this healthcare journey with you.  Stop by the lab prior to leaving today. I will notify you of your results once received.   Please keep in mind Any my chart messages you send have up to a three business day turnaround for a response.  Phone calls may take up to a one full business day turnaround for a  response.   If you need a medication refill I recommend you request it through the pharmacy as this is easiest for [place] rather than sending a message and or phone call.   Due to recent changes in healthcare laws, you may see results of your imaging and/or laboratory studies on MyChart before I have had a chance to review them.  I understand that in some cases there may be results that are confusing or concerning to you. Please understand that not all results are received at the same time and often I may need to interpret multiple results in order to provide you with the best plan of care or course of treatment. Therefore, I ask that you please give me 2 business days to thoroughly review all your results before contacting my office for clarification. Should we see a critical lab result, you will be contacted sooner.   It was a pleasure seeing you today! Please do not hesitate to reach out with any questions and or concerns.  Regards,   Eugenia Pancoast FNP-C

## 2021-12-07 NOTE — Progress Notes (Signed)
New Patient Office Visit  Subjective:  Patient ID: Tammy Decker, female    DOB: 1971/01/10  Age: 51 y.o. MRN: [id number]  CC:  Chief Complaint  Patient presents with   Establish Care    HPI Tammy Decker is here to establish care as a new patient. Recently moved here from [place]   Prior provider was: has not had in a bit.  Pt is without acute concerns.   Drinks alcohol daily, whiskey , about 1/5th a day and will have tremors when she tries to cut down on it. She has went to rehab before and she couldn't stay three days but after that was 'kicked out ' of the program. She also has looked into counseling and rehab centers in the past for support, but wasn't fully helpful.   Does c/o urinary frequency urgency, blood in urine and retention. Does have urinary incontinence with urge/stress. no vaginal discharge.   C/o chronic fatigue.  Hot flashes Sleeps all of the time.   H/o lyme disease, has appt pending with rheum, has appt pending she is waiting to schedule. Set up by gen surgeon   General surgeon, Dr. Dahlia Byes, he performed robotic ventral hernia repair 05/09/20. She recently saw him 9/11 for abd discomfort and bulging of abd wall however with physical exam wa sstable and he wanted her for f/u three months. She also does see Gi Dr. Marius Ditch, h/o EGD , bx with negative pathology.      12/07/2021    3:41 PM 09/11/2020   11:44 AM  GAD 7 : Generalized Anxiety Score  Nervous, Anxious, on Edge 3 3  Control/stop worrying 3 3  Worry too much - different things 3 3  Trouble relaxing 3 3  Restless 3 3  Easily annoyed or irritable 3 3  Afraid - awful might happen 2 3  Total GAD 7 Score 20 21  Anxiety Difficulty Very difficult        12/07/2021    3:41 PM 09/11/2020   11:44 AM  PHQ9 SCORE ONLY  PHQ-9 Total Score 21 24      chronic concerns:  HTN:  Past Medical History:  Diagnosis Date   Asthma    Complication of anesthesia    OXYGEN SOMETIMES DROPS LOW    GERD (gastroesophageal reflux disease)    Hypertension    Tachycardia 2020    Past Surgical History:  Procedure Laterality Date   BACK SURGERY     CHOLECYSTECTOMY     ESOPHAGOGASTRODUODENOSCOPY (EGD) WITH PROPOFOL N/A 06/19/2021   Procedure: ESOPHAGOGASTRODUODENOSCOPY (EGD) WITH PROPOFOL;  Surgeon: Lin Landsman, MD;  Location: [facility] END[facility]COPY;  Service: Gastroenterology;  Laterality: N/A;   NECK SURGERY     uterine ablation     XI ROBOTIC ASSISTED VENTRAL HERNIA N/A 05/09/2020   Procedure: XI ROBOTIC ASSISTED VENTRAL HERNIA, incisional hernia;  Surgeon: Jules Husbands, MD;  Location: [facility] ORS;  Service: General;  Laterality: N/A;    Family History  Problem Relation Age of Onset   Diabetes Mother    Diabetes Father    Heart attack Father    Ovarian cancer Maternal Grandmother        older dx maybe early 67s   Colon cancer Maternal Grandfather        in his 2's   Early death Paternal Grandfather    Heart attack Paternal Grandfather     Social History   Socioeconomic History   Marital status: Married    Spouse name:  Not on file   Number of children: Not on file   Years of education: Not on file   Highest education level: Not on file  Occupational History   Not on file  Tobacco Use   Smoking status: Every Day    Packs/day: 0.15    Types: Cigarettes    Passive exposure: Past   Smokeless tobacco: Never   Tobacco comments:    down to 3 cigs per day  Vaping Use   Vaping Use: Never used  Substance and Sexual Activity   Alcohol use: Yes    Comment: 1/5 whiskey nightly   Drug use: Never   Sexual activity: Yes    Partners: Male    Birth control/protection: None, Post-menopausal  Other Topics Concern   Not on file  Social History Narrative   Not on file   Social Determinants of Health   Financial Resource Strain: Not on file  Food Insecurity: Not on file  Transportation Needs: Not on file  Physical Activity: Not on file  Stress: Not on file  Social  Connections: Not on file  Intimate Partner Violence: Not on file    Outpatient Medications Prior to Visit  Medication Sig Dispense Refill   omeprazole (PRILOSEC) 40 MG capsule Take 1 capsule (40 mg total) by mouth 2 (two) times daily before a meal. (Patient taking differently: Take 40 mg by mouth 3 (three) times daily.) 60 capsule 0   No facility-administered medications prior to visit.    Allergies  Allergen Reactions   Ciprofloxacin Anaphylaxis   Kiwi Extract Anaphylaxis   Sulfa Antibiotics Hives        Objective:    Physical Exam Vitals reviewed.  Constitutional:      General: She is not in acute distress.    Appearance: Normal appearance. She is obese. She is not ill-appearing or toxic-appearing.  HENT:     Right Ear: Tympanic membrane normal.     Left Ear: Tympanic membrane normal.     Mouth/Throat:     Mouth: Mucous membranes are moist.     Pharynx: No pharyngeal swelling.     Tonsils: No tonsillar exudate.  Eyes:     Extraocular Movements: Extraocular movements intact.     Conjunctiva/sclera: Conjunctivae normal.     Pupils: Pupils are equal, round, and reactive to light.  Neck:     Thyroid: No thyroid mass.  Cardiovascular:     Rate and Rhythm: Normal rate and regular rhythm.  Pulmonary:     Effort: Pulmonary effort is normal.     Breath sounds: Normal breath sounds.  Abdominal:     General: Abdomen is flat. Bowel sounds are normal.     Palpations: Abdomen is soft.     Tenderness: There is abdominal tenderness in the right upper quadrant and epigastric area. There is no guarding.  Musculoskeletal:        General: Normal range of motion.  Lymphadenopathy:     Cervical:     Right cervical: No superficial cervical adenopathy.    Left cervical: No superficial cervical adenopathy.  Skin:    General: Skin is warm.     Capillary Refill: Capillary refill takes less than 2 seconds.  Neurological:     General: No focal deficit present.     Mental Status: She  is alert and oriented to person, place, and time.  Psychiatric:        Mood and Affect: Mood normal.        Behavior: Behavior normal.  Thought Content: Thought content normal.        Judgment: Judgment normal.       BP 120/76   Pulse (!) 108   Temp 98.4 F (36.9 C)   Resp 16   Ht $R'5\' 7"'yN$  (1.702 m)   Wt 240 lb 4 oz (109 kg)   SpO2 96%   BMI 37.63 kg/m  Wt Readings from Last 3 Encounters:  12/07/21 240 lb 4 oz (109 kg)  11/12/21 228 lb (103.4 kg)  07/02/21 228 lb (103.4 kg)     Health Maintenance Due  Topic Date Due   COVID-19 Vaccine (1) Never done   HIV Screening  Never done   Hepatitis C Screening  Never done   PAP SMEAR-Modifier  Never done   COLONOSCOPY (Pts 45-87yrs Insurance coverage will need to be confirmed)  Never done   INFLUENZA VACCINE  Never done    There are no preventive care reminders to display for this patient.  Lab Results  Component Value Date   TSH 2.750 09/11/2020   Lab Results  Component Value Date   WBC 7.5 11/15/2021   HGB 15.5 (H) 11/15/2021   HCT 46.5 (H) 11/15/2021   MCV 101.8 (H) 11/15/2021   PLT 265 11/15/2021   Lab Results  Component Value Date   NA 139 11/15/2021   K 4.2 11/15/2021   CO2 24 11/15/2021   GLUCOSE 133 (H) 11/15/2021   BUN 6 11/15/2021   CREATININE 0.58 11/15/2021   BILITOT 0.6 11/15/2021   ALKPHOS 91 11/15/2021   AST 115 (H) 11/15/2021   ALT 88 (H) 11/15/2021   PROT 7.8 11/15/2021   ALBUMIN 3.7 11/15/2021   CALCIUM 8.5 (L) 11/15/2021   ANIONGAP 7 11/15/2021   EGFR 113 09/11/2020   Lab Results  Component Value Date   CHOL 217 (H) 09/11/2020   Lab Results  Component Value Date   HDL 81 09/11/2020   Lab Results  Component Value Date   LDLCALC 121 (H) 09/11/2020   Lab Results  Component Value Date   TRIG 86 09/11/2020   Lab Results  Component Value Date   CHOLHDL 2.7 09/11/2020   No results found for: "HGBA1C"    Assessment & Plan:   Problem List Items Addressed This Visit        Cardiovascular and Mediastinum   Essential hypertension    Restart losartan 100 mg prescription sent to pharmacy Keep blood pressure log at home and monitor daily.  Follow-up within the month to go over blood pressure log      Relevant Medications   losartan (COZAAR) 100 MG tablet     Digestive   Chronic GERD    Refill omeprazole 40 mg once daily      Relevant Medications   omeprazole (PRILOSEC) 40 MG capsule   RESOLVED: Gastroesophageal reflux disease - Primary   Relevant Medications   omeprazole (PRILOSEC) 40 MG capsule     Other   Alcohol abuse    Patient willing to quit Referral sent to social worker to offer recommendations on best plan moving forward did recommend that she cut down slowly over the next few days and titrate down .  Advised to increase water intake as well       Relevant Orders   AMB Referral to [facility]   Elevated LFTs    Repeat BMP pending results      History of hiatal hernia    Patient to continue ongoing follow-up with general surgeon  Heart rate fast    Increase water intake.  Avoid dehydration cut down on alcohol intake  Will obtain EKG at next visit patient currently asymptomatic Ordering CBC CMP and TSH to rule out other etiologies for increased heart rate      Anxiety and depression   Relevant Orders   AMB Referral to [place]   Menopause    Lab work ordered pending results      Relevant Orders   Prolactin   TSH   Follicle stimulating hormone   Elevated hemoglobin ([place])    Ordering B12 and folate May also be elevated due to smoking status Also consider hemochromatosis      Relevant Orders   CBC   B12 and Folate Panel   IBC + Ferritin   Iron,Total and Total Iron Binding Capacity(REFL)   Ferritin   Hyperglycemia    A1c ordered pending results work on prediabetic diet and exercise as tolerated       Relevant Orders   Hemoglobin A1c   H/o Lyme disease    Continue  consult follow-up with rheumatologist as scheduled      Nicotine abuse   Other Visit Diagnoses     Urinary body odor       Relevant Orders   Urine Culture   Urinary frequency       Relevant Orders   Urine Culture   POCT Urinalysis Dipstick (Automated) (Completed)       Meds ordered this encounter  Medications   omeprazole (PRILOSEC) 40 MG capsule    Sig: Take 1 capsule (40 mg total) by mouth daily.    Dispense:  60 capsule    Refill:  0    Order Specific Question:   Supervising Provider    Answer:   BEDSOLE, AMY E [[id number]]   losartan (COZAAR) 100 MG tablet    Sig: Take 1 tablet (100 mg total) by mouth daily.    Dispense:  90 tablet    Refill:  3    Order Specific Question:   Supervising Provider    Answer:   Diona Browner, AMY E [[id number]]    Follow-up: Return in about 1 week (around 12/14/2021) for f/u heart rate.    Eugenia Pancoast, FNP

## 2021-12-07 NOTE — Assessment & Plan Note (Signed)
Patient to continue ongoing follow-up with general surgeon

## 2021-12-07 NOTE — Assessment & Plan Note (Signed)
A1c ordered pending results work on prediabetic diet and exercise as tolerated

## 2021-12-07 NOTE — Assessment & Plan Note (Signed)
Lab work ordered pending results

## 2021-12-07 NOTE — Assessment & Plan Note (Signed)
Patient willing to quit Referral sent to social worker to offer recommendations on best plan moving forward did recommend that she cut down slowly over the next few days and titrate down .  Advised to increase water intake as well

## 2021-12-07 NOTE — Assessment & Plan Note (Addendum)
Continue consult follow-up with rheumatologist as scheduled  Time in clinic totaled 63 minutes going over acute and chronic concerns reviewing old history with GI visit as well as general surgery visit.

## 2021-12-07 NOTE — Assessment & Plan Note (Signed)
Repeat BMP pending results

## 2021-12-07 NOTE — Assessment & Plan Note (Addendum)
Increase water intake.  Avoid dehydration cut down on alcohol intake  Will obtain EKG at next visit patient currently asymptomatic Ordering CBC CMP and TSH to rule out other etiologies for increased heart rate

## 2021-12-07 NOTE — Assessment & Plan Note (Signed)
Ordering B12 and folate May also be elevated due to smoking status Also consider hemochromatosis

## 2021-12-07 NOTE — Assessment & Plan Note (Signed)
Refill omeprazole 40 mg once daily

## 2021-12-07 NOTE — Assessment & Plan Note (Signed)
Restart losartan 100 mg prescription sent to pharmacy Keep blood pressure log at home and monitor daily.  Follow-up within the month to go over blood pressure log

## 2021-12-09 LAB — URINE CULTURE
MICRO NUMBER:: 14018708
SPECIMEN QUALITY:: ADEQUATE

## 2021-12-10 ENCOUNTER — Other Ambulatory Visit: Payer: Self-pay | Admitting: Family

## 2021-12-10 ENCOUNTER — Telehealth: Payer: Self-pay | Admitting: *Deleted

## 2021-12-10 DIAGNOSIS — N3000 Acute cystitis without hematuria: Secondary | ICD-10-CM

## 2021-12-10 MED ORDER — NITROFURANTOIN MONOHYD MACRO 100 MG PO CAPS: 100.0000 mg | ORAL_CAPSULE | Freq: Two times a day (BID) | ORAL | 0 refills | Status: AC

## 2021-12-10 NOTE — Chronic Care Management (AMB) (Signed)
  Care Coordination   Note   12/10/2021 Name: Tammy Decker MRN: [id number] DOB: 01-20-1971  Tammy Decker is a 51 y.o. year old female who sees Eugenia Pancoast, FNP for primary care. I reached out to Lannette Donath by phone today to offer care coordination services.  [name] was given information about Care Coordination services today including:   The Care Coordination services include support from the care team which includes your Nurse Coordinator, Clinical Social Worker, or Pharmacist.  The Care Coordination team is here to help remove barriers to the health concerns and goals most important to you. Care Coordination services are voluntary, and the patient may decline or stop services at any time by request to their care team member.   Care Coordination Consent Status: Patient agreed to services and verbal consent obtained.   Follow up plan:  Telephone appointment with care coordination team member scheduled for:  12/12/2021  Encounter Outcome:  Pt. Scheduled from referral   Julian Hy, [place] Direct Dial: [phone]

## 2021-12-12 ENCOUNTER — Encounter: Payer: Self-pay | Admitting: *Deleted

## 2021-12-17 ENCOUNTER — Ambulatory Visit: Payer: Self-pay | Admitting: *Deleted

## 2021-12-18 ENCOUNTER — Other Ambulatory Visit: Payer: No Typology Code available for payment source

## 2021-12-19 ENCOUNTER — Other Ambulatory Visit (INDEPENDENT_AMBULATORY_CARE_PROVIDER_SITE_OTHER): Payer: No Typology Code available for payment source

## 2021-12-19 ENCOUNTER — Encounter: Payer: Self-pay | Admitting: Family

## 2021-12-19 DIAGNOSIS — R000 Tachycardia, unspecified: Secondary | ICD-10-CM

## 2021-12-19 DIAGNOSIS — L750 Bromhidrosis: Secondary | ICD-10-CM

## 2021-12-19 DIAGNOSIS — Z780 Asymptomatic menopausal state: Secondary | ICD-10-CM

## 2021-12-19 DIAGNOSIS — K219 Gastro-esophageal reflux disease without esophagitis: Secondary | ICD-10-CM

## 2021-12-19 DIAGNOSIS — Z8719 Personal history of other diseases of the digestive system: Secondary | ICD-10-CM | POA: Diagnosis not present

## 2021-12-19 DIAGNOSIS — R7989 Other specified abnormal findings of blood chemistry: Secondary | ICD-10-CM

## 2021-12-19 DIAGNOSIS — R350 Frequency of micturition: Secondary | ICD-10-CM

## 2021-12-19 DIAGNOSIS — F1010 Alcohol abuse, uncomplicated: Secondary | ICD-10-CM

## 2021-12-19 DIAGNOSIS — R739 Hyperglycemia, unspecified: Secondary | ICD-10-CM | POA: Diagnosis not present

## 2021-12-19 DIAGNOSIS — F419 Anxiety disorder, unspecified: Secondary | ICD-10-CM

## 2021-12-19 DIAGNOSIS — F32A Depression, unspecified: Secondary | ICD-10-CM

## 2021-12-19 DIAGNOSIS — Z720 Tobacco use: Secondary | ICD-10-CM

## 2021-12-19 DIAGNOSIS — D582 Other hemoglobinopathies: Secondary | ICD-10-CM | POA: Diagnosis not present

## 2021-12-19 DIAGNOSIS — I10 Essential (primary) hypertension: Secondary | ICD-10-CM | POA: Diagnosis not present

## 2021-12-19 DIAGNOSIS — Z8619 Personal history of other infectious and parasitic diseases: Secondary | ICD-10-CM

## 2021-12-19 NOTE — Patient Outreach (Signed)
  Care Coordination   Initial Visit Note   12/19/2021 Name: Tammy Decker MRN: [id number] DOB: [birth year]  Tammy Decker is a 51 y.o. year old female who sees Eugenia Pancoast, FNP for primary care. I spoke with  Lannette Donath by phone today.  What matters to the patients health and wellness today?  Substance Abuse Resources    Goals Addressed             This Visit's Progress    "I am ready for treatment"       Care Coordination Interventions: Patient discussed challenges with Alcohol misuse Treatment options discussed-patient interested in outpatient treatment as well as AA  Discussed possible options with leadership on 12/18/21-will suggest outpatient options with patient as well as EACP Active listening / Reflection utilized  Emotional Support Provided Participation in counseling encouraged          SDOH assessments and interventions completed:  Yes  SDOH Interventions Today    Flowsheet Row Most Recent Value  SDOH Interventions   Food Insecurity Interventions Intervention Not Indicated  Housing Interventions Intervention Not Indicated        Care Coordination Interventions Activated:  Yes  Care Coordination Interventions:  Yes, provided   Follow up plan: Follow up call scheduled for 12/20/21    Encounter Outcome:  Pt. Visit Completed

## 2021-12-19 NOTE — Patient Instructions (Signed)
Visit Information  Thank you for taking time to visit with me today. Please don't hesitate to contact me if I can be of assistance to you.   Following are the goals we discussed today:   Goals Addressed             This Visit's Progress    "I am ready for treatment"       Care Coordination Interventions: Patient discussed challenges with Alcohol misuse Treatment options discussed-patient interested in outpatient treatment as well as AA  Discussed possible options with leadership on 12/18/21-will suggest outpatient options with patient as well as EACP Active listening / Reflection utilized  Emotional Support Provided Participation in counseling encouraged          Our next appointment is by telephone on 12/20/21 at 2:30pm  Please call the care guide team at [phone] if you need to cancel or reschedule your appointment.   If you are experiencing a Mental Health or [place] or need someone to talk to, please call the Suicide and Crisis Lifeline: 988   Patient verbalizes understanding of instructions and care plan provided today and agrees to view in [place]. Active MyChart status and patient understanding of how to access instructions and care plan via MyChart confirmed with patient.     Telephone follow up appointment with care management team member scheduled for:12/20/21  Elliot Gurney, [place] Worker  [facility] Care Management [phone]

## 2021-12-20 ENCOUNTER — Ambulatory Visit: Payer: Self-pay | Admitting: *Deleted

## 2021-12-20 LAB — PROLACTIN: Prolactin: 7.9 ng/mL

## 2021-12-20 LAB — TSH: TSH: 3.04 u[IU]/mL (ref 0.35–5.50)

## 2021-12-20 LAB — IBC PANEL
Iron: 88 ug/dL (ref 42–145)
Saturation Ratios: 23.4 % (ref 20.0–50.0)
TIBC: 376.6 ug/dL (ref 250.0–450.0)
Transferrin: 269 mg/dL (ref 212.0–360.0)

## 2021-12-20 LAB — CBC
HCT: 44.1 % (ref 36.0–46.0)
Hemoglobin: 14.9 g/dL (ref 12.0–15.0)
MCHC: 33.8 g/dL (ref 30.0–36.0)
MCV: 106.2 fl — ABNORMAL HIGH (ref 78.0–100.0)
Platelets: 234 10*3/uL (ref 150.0–400.0)
RBC: 4.15 Mil/uL (ref 3.87–5.11)
RDW: 14.9 % (ref 11.5–15.5)
WBC: 10.1 10*3/uL (ref 4.0–10.5)

## 2021-12-20 LAB — FOLLICLE STIMULATING HORMONE: FSH: 12.3 m[IU]/mL

## 2021-12-20 LAB — B12 AND FOLATE PANEL
Folate: 4.1 ng/mL — ABNORMAL LOW (ref >5.9)
Vitamin B-12: 255 pg/mL (ref 211–911)

## 2021-12-20 LAB — HEMOGLOBIN A1C: Hgb A1c MFr Bld: 6.1 % (ref 4.6–6.5)

## 2021-12-20 LAB — FERRITIN: Ferritin: 302.1 ng/mL — ABNORMAL HIGH (ref 10.0–291.0)

## 2021-12-21 NOTE — Patient Instructions (Signed)
Visit Information  Thank you for taking time to visit with me today. Please don't hesitate to contact me if I can be of assistance to you.   Following are the goals we discussed today:   Goals Addressed             This Visit's Progress    "I am ready for treatment"       Care Coordination Interventions: Patient discussed continued challenges with Alcohol misuse Treatment options discussed(including detox, partial program, outpatient-patient interested in outpatient treatment as well as AA  Collaboration phone call to [facility] who recommended [facility] as well as the [place] phone call  with patient on phone to the [place] to discuss treatment options PA available as well as intensive outpatient treatment 3x per week from 6;30-9:30pm patient agreeable to calling the [facility] to schedule an the initial assessment-patient aware that this agency would be considered out of network of utilized Collaboration phone call to [employer], list of in network providers emailed to this [place] and reviewed Patient agreeable to contacting the [facility] to schedule the initial assessment and also willing to attend [employer], websight to [employer] schedule provided Active listening / Reflection utilized  Emotional Support Provided Participation in counseling encouraged          Our next appointment is by telephone on 12/27/21 at 3pm  Please call the care guide team at [phone] if you need to cancel or reschedule your appointment.   If you are experiencing a Mental Health or [place] or need someone to talk to, please call the Suicide and Crisis Lifeline: 988 call 911   Patient verbalizes understanding of instructions and care plan provided today and agrees to view in [place]. Active MyChart status and patient understanding of how to access instructions and care plan via MyChart confirmed with patient.      Telephone follow up appointment with care management team member scheduled for: 12/27/21  Elliot Gurney, [place] Worker  [facility] Care Management [phone]

## 2021-12-21 NOTE — Patient Outreach (Signed)
  Care Coordination   Follow Up Visit Note   12/21/2021 Name: Lane Kjos MRN: [id number] DOB: 1970/04/27  Scarlettrose Costilow is a 51 y.o. year old female who sees Eugenia Pancoast, FNP for primary care. I spoke with  Lannette Donath by phone today.  What matters to the patients health and wellness today?  Outpatient treatment    Goals Addressed             This Visit's Progress    "I am ready for treatment"       Care Coordination Interventions: Patient discussed continued challenges with Alcohol misuse Treatment options discussed(including detox, partial program, outpatient-patient interested in outpatient treatment as well as AA  Collaboration phone call to [facility] who recommended [facility] as well as the [place] phone call  with patient on phone to the [facility] to discuss treatment options PA available as well as intensive outpatient treatment 3x per week from 6;30-9:30pm patient agreeable to calling the [facility] to schedule an the initial assessment-patient aware that this agency would be considered out of network of utilized Collaboration phone call to [employer], list of in network providers emailed to this [place] and reviewed Patient agreeable to contacting the [place] to schedule the initial assessment and also willing to attend [employer], websight to [employer] meeting schedule provided Active listening / Reflection utilized  Emotional Support Provided Participation in counseling encouraged          SDOH assessments and interventions completed:  No     Care Coordination Interventions Activated:  Yes  Care Coordination Interventions:  Yes, provided   Follow up plan: Follow up call scheduled for 12/28/21    Encounter Outcome:  Pt. Visit Completed

## 2021-12-24 ENCOUNTER — Other Ambulatory Visit: Payer: Self-pay | Admitting: Family

## 2021-12-24 DIAGNOSIS — D520 Dietary folate deficiency anemia: Secondary | ICD-10-CM

## 2021-12-24 MED ORDER — FOLIC ACID 1 MG PO TABS: 1.0000 mg | ORAL_TABLET | Freq: Every day | ORAL | 1 refills | Status: DC

## 2021-12-24 NOTE — Progress Notes (Signed)
Confirm with pt she is not currently taking iron over the counter? Ferritin is high, possible diagnosis called hemochromotosis. I think it might be a good idea to make referral to hematology to assess further.   B12 on lower end of normal suggest starting otc b12 1000 mcg once daily.  Folate also low, will send in daily folate to take once a day.  Thyroid ok.  Prediabetic, work on diabetic diet exercise as tolerated.

## 2021-12-26 ENCOUNTER — Ambulatory Visit: Payer: Self-pay | Admitting: *Deleted

## 2021-12-26 ENCOUNTER — Ambulatory Visit (INDEPENDENT_AMBULATORY_CARE_PROVIDER_SITE_OTHER)
Admission: RE | Admit: 2021-12-26 | Discharge: 2021-12-26 | Disposition: A | Discharge status: Home / Self Care | Payer: No Typology Code available for payment source | Source: Ambulatory Visit | Attending: Family Medicine | Admitting: Family Medicine

## 2021-12-26 ENCOUNTER — Ambulatory Visit (INDEPENDENT_AMBULATORY_CARE_PROVIDER_SITE_OTHER): Payer: No Typology Code available for payment source | Admitting: Family Medicine

## 2021-12-26 VITALS — BP 128/84 | HR 108 | Temp 97.5°F | Wt 238.0 lb

## 2021-12-26 DIAGNOSIS — R051 Acute cough: Secondary | ICD-10-CM | POA: Diagnosis not present

## 2021-12-26 DIAGNOSIS — R0602 Shortness of breath: Secondary | ICD-10-CM | POA: Diagnosis not present

## 2021-12-26 LAB — POC INFLUENZA A&B (BINAX/QUICKVUE)
Influenza A, POC: NEGATIVE
Influenza B, POC: NEGATIVE

## 2021-12-26 MED ORDER — ALBUTEROL SULFATE HFA 108 (90 BASE) MCG/ACT IN AERS: 2.0000 | INHALATION_SPRAY | Freq: Four times a day (QID) | RESPIRATORY_TRACT | 0 refills | Status: DC | PRN

## 2021-12-26 MED ORDER — AZITHROMYCIN 250 MG PO TABS: ORAL_TABLET | ORAL | 0 refills | Status: DC

## 2021-12-26 MED ORDER — PREDNISONE 10 MG PO TABS: ORAL_TABLET | ORAL | 0 refills | Status: DC

## 2021-12-26 NOTE — Addendum Note (Signed)
Addended by: Eliezer Lofts E on: 12/26/2021 01:12 PM   Modules accepted: Orders

## 2021-12-26 NOTE — Patient Instructions (Signed)
Quit smoking. 

## 2021-12-26 NOTE — Progress Notes (Signed)
Patient ID: Tammy Decker, female    DOB: November 22, 1970, 51 y.o.   MRN: [id number]  This visit was conducted in person.  BP 128/84   Pulse (!) 108   Temp (!) 97.5 F (36.4 C) (Temporal)   Wt 238 lb (108 kg)   SpO2 96%   BMI 37.28 kg/m    CC: Chief Complaint  Patient presents with   Cough    Dry over a week. Taken several covid tests and all were negative.    Nasal Congestion   Fatigue    Subjective:   HPI: Tammy Decker is a 51 y.o. female Patient Tammy Decker , current smoker presenting on 12/26/2021 for Cough (Dry over a week. Taken several covid tests and all were negative. ), Nasal Congestion, and Fatigue   Date of onset:  1 week ago Has had COVID test x 2 negative, most recent yesterday.  Initial symptoms of cough and congestion... symptoms are progressing.  She is feeling very tired. Dry cough.  No fever.  Fatigue extreme.. no body aches.   She is feeling SOB with going up stairs, intermittent wheeze worse with lying flat.  No ear pain, no face pain.  Has tried advil, dayquil and nasal spray.   No known history of COPD or asthma.  Current smoker   No known sick contacts, she is a Engineer, civil (consulting) at [employer].   Relevant past medical, surgical, family and social history reviewed and updated as indicated. Interim medical history since our last visit reviewed. Allergies and medications reviewed and updated. Outpatient Medications Prior to Visit  Medication Sig Dispense Refill   cyanocobalamin (VITAMIN B12) 1000 MCG tablet Take 1,000 mcg by mouth daily.     folic acid (FOLVITE) 1 MG tablet Take 1 tablet (1 mg total) by mouth daily. 30 tablet 1   losartan (COZAAR) 100 MG tablet Take 1 tablet (100 mg total) by mouth daily. 90 tablet 3   omeprazole (PRILOSEC) 40 MG capsule Take 1 capsule (40 mg total) by mouth daily. 60 capsule 0   No facility-administered medications prior to visit.     Per HPI unless specifically indicated in ROS section  below Review of Systems  Constitutional:  Positive for fatigue. Negative for fever.  HENT:  Positive for congestion.   Eyes:  Negative for pain.  Respiratory:  Positive for cough, shortness of breath and wheezing.   Cardiovascular:  Negative for chest pain, palpitations and leg swelling.  Gastrointestinal:  Negative for abdominal pain.  Genitourinary:  Negative for dysuria and vaginal bleeding.  Musculoskeletal:  Negative for back pain.  Neurological:  Negative for syncope, light-headedness and headaches.  Psychiatric/Behavioral:  Negative for dysphoric mood.    Objective:  BP 128/84   Pulse (!) 108   Temp (!) 97.5 F (36.4 C) (Temporal)   Wt 238 lb (108 kg)   SpO2 96%   BMI 37.28 kg/m   Wt Readings from Last 3 Encounters:  12/26/21 238 lb (108 kg)  12/07/21 240 lb 4 oz (109 kg)  11/12/21 228 lb (103.4 kg)      Physical Exam Constitutional:      General: She is not in acute distress.    Appearance: Normal appearance. She is well-developed. She is not ill-appearing or toxic-appearing.  HENT:     Head: Normocephalic.     Right Ear: Hearing, tympanic membrane, ear canal and external ear normal. Tympanic membrane is not erythematous, retracted or bulging.     Left Ear:  Hearing, tympanic membrane, ear canal and external ear normal. Tympanic membrane is not erythematous, retracted or bulging.     Nose: No mucosal edema or rhinorrhea.     Right Sinus: No maxillary sinus tenderness or frontal sinus tenderness.     Left Sinus: No maxillary sinus tenderness or frontal sinus tenderness.     Mouth/Throat:     Pharynx: Uvula midline.  Eyes:     General: Lids are normal. Lids are everted, no foreign bodies appreciated.     Conjunctiva/sclera: Conjunctivae normal.     Pupils: Pupils are equal, round, and reactive to light.  Neck:     Thyroid: No thyroid mass or thyromegaly.     Vascular: No carotid bruit.     Trachea: Trachea normal.  Cardiovascular:     Rate and Rhythm: Normal  rate and regular rhythm.     Pulses: Normal pulses.     Heart sounds: Normal heart sounds, S1 normal and S2 normal. No murmur heard.    No friction rub. No gallop.  Pulmonary:     Effort: Pulmonary effort is normal. No tachypnea or respiratory distress.     Breath sounds: Decreased air movement present. Rhonchi present. No decreased breath sounds, wheezing or rales.  Abdominal:     General: Bowel sounds are normal.     Palpations: Abdomen is soft.     Tenderness: There is no abdominal tenderness.  Musculoskeletal:     Cervical back: Normal range of motion and neck supple.  Skin:    General: Skin is warm and dry.     Findings: No rash.  Neurological:     Mental Status: She is alert.  Psychiatric:        Mood and Affect: Mood is not anxious or depressed.        Speech: Speech normal.        Behavior: Behavior normal. Behavior is cooperative.        Thought Content: Thought content normal.        Judgment: Judgment normal.       Results for orders placed or performed in visit on 12/19/21  TSH  Result Value Ref Range   TSH 3.04 0.35 - 5.50 uIU/mL  Prolactin  Result Value Ref Range   Prolactin 7.9 ng/mL  Hemoglobin A1c  Result Value Ref Range   Hgb A1c MFr Bld 6.1 4.6 - 6.5 %  Follicle stimulating hormone  Result Value Ref Range   FSH 12.3 mIU/ML  Ferritin  Result Value Ref Range   Ferritin 302.1 (H) 10.0 - 291.0 ng/mL  CBC  Result Value Ref Range   WBC 10.1 4.0 - 10.5 K/uL   RBC 4.15 3.87 - 5.11 Mil/uL   Platelets 234.0 150.0 - 400.0 K/uL   Hemoglobin 14.9 12.0 - 15.0 g/dL   HCT 44.1 36.0 - 46.0 %   MCV 106.2 (H) 78.0 - 100.0 fl   MCHC 33.8 30.0 - 36.0 g/dL   RDW 14.9 11.5 - 15.5 %  B12 and Folate Panel  Result Value Ref Range   Vitamin B-12 255 211 - 911 pg/mL   Folate 4.1 (L) >5.9 ng/mL  IBC panel  Result Value Ref Range   Iron 88 42 - 145 ug/dL   Transferrin 269.0 212.0 - 360.0 mg/dL   Saturation Ratios 23.4 20.0 - 50.0 %   TIBC 376.6 250.0 - 450.0 mcg/dL      COVID 19 screen:  No recent travel or known exposure to COVID19 The patient denies  respiratory symptoms of COVID 19 at this time. The importance of social distancing was discussed today.   Assessment and Plan Problem List Items Addressed This Visit     Acute cough - Primary   Relevant Orders   DG Chest 2 View   SOB (shortness of breath) on exertion   Relevant Orders   DG Chest 2 View   Worsening symptoms past 1 week of illness.  Negative COVID test.  Will check flu test today.  Concerning for bacterial bronchitis versus pneumonia.  Will send for chest x-ray but will go ahead and start treatment with azithromycin x5 days. Given shortness of breath with exertion and history of smoking, possible COPD exacerbation.  Will treat with prednisone taper and albuterol inhaler as needed. Return and ER precautions provided.   Of note given recent labs were done during the beginning of this illness possible that elevated ferritin could be reactive to current symptoms as opposed to hemochromatosis.  Meds ordered this encounter  Medications   azithromycin (ZITHROMAX) 250 MG tablet    Sig: 2 tab po x 1 day then 1 tab po daily    Dispense:  6 tablet    Refill:  0   predniSONE (DELTASONE) 10 MG tablet    Sig: 3 tabs by mouth daily x 3 days, then 2 tabs by mouth daily x 2 days then 1 tab by mouth daily x 2 days    Dispense:  15 tablet    Refill:  0   albuterol (VENTOLIN HFA) 108 (90 Base) MCG/ACT inhaler    Sig: Inhale 2 puffs into the lungs every 6 (six) hours as needed for wheezing or shortness of breath.    Dispense:  8 g    Refill:  0   Orders Placed This Encounter  Procedures   DG Chest 2 View    Standing Status:   Future    Standing Expiration Date:   12/27/2022    Order Specific Question:   Reason for Exam (SYMPTOM  OR DIAGNOSIS REQUIRED)    Answer:   cough > 1 week, SOB    Order Specific Question:   Is patient pregnant?    Answer:   No    Order Specific Question:   Preferred  imaging location?    Answer:   Barbie Haggis, MD

## 2021-12-27 ENCOUNTER — Ambulatory Visit: Payer: Self-pay | Admitting: *Deleted

## 2021-12-27 NOTE — Patient Outreach (Signed)
  Care Coordination   Follow Up Visit Note   12/27/2021 Name: Tammy Decker MRN: [id number] DOB: May 28, 1970  Tammy Decker is a 51 y.o. year old female who sees Eugenia Pancoast, FNP for primary care. I spoke with  Tammy Decker by phone today.  What matters to the patients health and wellness today?  Substance abuse treatement    Goals Addressed             This Visit's Progress    "I am ready for treatment"       Care Coordination Interventions: Patient continues to discuss her motivation for treatment Confirms that she was able to contact the [facility] , however they are out of network, treatment would be out of her price range Attempts made to call provider's received from provider list, however no options identified  Collaboration phone call to [facility] outpatient behavioral health-remained on hold for several minutes, was not able to connect to discuss treatment options This social worker will make additional attempt to contact [facility] follow up with patient on 12/27/21 Active listening / Reflection utilized  Emotional Support Provided Positive reinforcement provided for patient's motivation to begin outpatient treatment          SDOH assessments and interventions completed:  No     Care Coordination Interventions Activated:  Yes  Care Coordination Interventions:  Yes, provided   Follow up plan: Follow up call scheduled for 12/27/21    Encounter Outcome:  Pt. Visit Completed

## 2021-12-27 NOTE — Patient Instructions (Signed)
Visit Information  Thank you for taking time to visit with me today. Please don't hesitate to contact me if I can be of assistance to you.   Following are the goals we discussed today:   Goals Addressed             This Visit's Progress    "I am ready for treatment"       Care Coordination Interventions: Patient continues to discuss her motivation for treatment Confirms that she was able to contact the [facility] , however they are out of network, treatment would be out of her price range Attempts made to call provider's received from provider list, however no options identified  Collaboration phone call to [facility] outpatient behavioral health-remained on hold for several minutes, was not able to connect to discuss treatment options This social worker will make additional attempt to contact [facility] follow up with patient on 12/27/21 Active listening / Reflection utilized  Emotional Support Provided Positive reinforcement provided for patient's motivation to begin outpatient treatment          Our next appointment is by telephone on 12/27/21 at 3pm  Please call the care guide team at [phone] if you need to cancel or reschedule your appointment.   If you are experiencing a Mental Health or [place] or need someone to talk to, please call the Suicide and Crisis Lifeline: 988   Patient verbalizes understanding of instructions and care plan provided today and agrees to view in [place]. Active MyChart status and patient understanding of how to access instructions and care plan via MyChart confirmed with patient.     Telephone follow up appointment with care management team member scheduled for: 12/27/21  Elliot Gurney, [place] Worker  [facility] Care Management [phone]

## 2021-12-27 NOTE — Telephone Encounter (Signed)
Spoke to pt and she had a call from the caseworker and will have another one today.

## 2021-12-28 ENCOUNTER — Ambulatory Visit: Payer: Self-pay | Admitting: *Deleted

## 2021-12-28 DIAGNOSIS — F1010 Alcohol abuse, uncomplicated: Secondary | ICD-10-CM

## 2021-12-28 NOTE — Patient Outreach (Incomplete)
  Care Coordination   Follow Up Visit Note   12/28/2021 Name: Algie Cales MRN: [id number] DOB: Mar 02, 1971  Jayci Ellefson is a 51 y.o. year old female who sees Eugenia Pancoast, FNP for primary care. I spoke with  Lannette Donath by phone today.  What matters to the patients health and wellness today?  Substance Abuse resources    Goals Addressed             This Visit's Progress   . "I am ready for treatment"       Care Coordination Interventions: Patient continues to discuss her motivation for treatment Confirmed that Naltrexone is not an option at the providers office  Discussed with patient referral to [facility] outpatient behavioral health-CDIOP-left voicemail with nurse regarding treatment options        SDOH assessments and interventions completed:  No{[facility] Tip this will not be part of the note when signed-REQUIRED REPORT FIELD DO NOT DELETE (Optional):27901}     Care Coordination Interventions Activated:  Yes {[facility] Tip this will not be part of the note when signed-REQUIRED REPORT FIELD DO NOT DELETE (Optional):27901} Care Coordination Interventions:  Yes, provided {[facility] Tip this will not be part of the note when signed-REQUIRED REPORT FIELD DO NOT DELETE (Optional):27901}  Follow up plan: Follow up call scheduled for 12/28/21    Encounter Outcome:  Pt. Visit Completed {[facility] Tip this will not be part of the note when signed-REQUIRED REPORT FIELD DO NOT DELETE (Optional):27901}

## 2021-12-28 NOTE — Addendum Note (Signed)
Addended by: Vern Claude on: 12/28/2021 10:49 AM   Modules accepted: Orders

## 2021-12-28 NOTE — Patient Outreach (Signed)
  Care Coordination   Follow Up Visit Note   12/28/2021 Name: Graceanna Theissen MRN: [id number] DOB: 07/11/1970  Marua Qin is a 51 y.o. year old female who sees Eugenia Pancoast, FNP for primary care. I spoke with  Lannette Donath by phone today.  What matters to the patients health and wellness today?  Outpatient treatment    Goals Addressed             This Visit's Progress    "I am ready for treatment"       Care Coordination Interventions: Patient continues to discuss her motivation for treatment Confirmed that Naltrexone is not an option at the providers office  Completed referral to [facility] outpatient behavioral health for outpatient mental health therapy and medication management CDIOP not an option at this time due to patient's work schedule Faxed in network provider list to patient's email [email or web address]        SDOH assessments and interventions completed:  No     Care Coordination Interventions Activated:  Yes  Care Coordination Interventions:  Yes, provided   Follow up plan: No further intervention required.   Encounter Outcome:  Pt. Visit Completed

## 2021-12-28 NOTE — Patient Outreach (Signed)
  Care Coordination   Follow Up Visit Note   12/28/2021 Name: Tammy Decker MRN: [id number] DOB: 09/27/1970  Tammy Decker is a 51 y.o. year old female who sees Eugenia Pancoast, FNP for primary care. I spoke with  Lannette Donath by phone today.  What matters to the patients health and wellness today?  Substance Abuse resources    Goals Addressed             This Visit's Progress    "I am ready for treatment"       Care Coordination Interventions: Patient continues to discuss her motivation for treatment Confirmed that Naltrexone is not an option at the providers office  Discussed with patient referral to [facility] outpatient behavioral health-CDIOP-left voicemail with nurse regarding treatment options        SDOH assessments and interventions completed:  No     Care Coordination Interventions Activated:  Yes  Care Coordination Interventions:  Yes, provided   Follow up plan: Follow up call scheduled for 12/28/21    Encounter Outcome:  Pt. Visit Completed

## 2021-12-28 NOTE — Patient Instructions (Signed)
Visit Information  Thank you for taking time to visit with me today. Please don't hesitate to contact me if I can be of assistance to you.   Following are the goals we discussed today:   Goals Addressed             This Visit's Progress    "I am ready for treatment"       Care Coordination Interventions: Patient continues to discuss her motivation for treatment Confirmed that Naltrexone is not an option at the providers office  Completed referral to [facility] outpatient behavioral health for outpatient mental health therapy and medication management CDIOP not an option at this time due to patient's work schedule Faxed in network provider list to patient's email [email or web address]         Please call the care guide team at [phone] if you need to cancel or reschedule your appointment.   If you are experiencing a Mental Health or [place] or need someone to talk to, please call the Suicide and Crisis Lifeline: 988 call 911   Patient verbalizes understanding of instructions and care plan provided today and agrees to view in [place]. Active MyChart status and patient understanding of how to access instructions and care plan via MyChart confirmed with patient.     No further follow up required: patient referred to behavioral health outpatient  Miller, [place] Worker  [facility] Care Management [phone]

## 2021-12-28 NOTE — Patient Instructions (Signed)
Visit Information  Thank you for taking time to visit with me today. Please don't hesitate to contact me if I can be of assistance to you.   Following are the goals we discussed today:   Goals Addressed             This Visit's Progress    "I am ready for treatment"       Care Coordination Interventions: Patient continues to discuss her motivation for treatment Confirmed that Naltrexone is not an option at the providers office  Discussed with patient referral to [facility] outpatient behavioral health-CDIOP-left voicemail with nurse regarding treatment options        Our next appointment is by telephone on 12/28/21 at 9am  Please call the care guide team at [phone] if you need to cancel or reschedule your appointment.   If you are experiencing a Mental Health or [place] or need someone to talk to, please call the Suicide and Crisis Lifeline: 988 call 911   Patient verbalizes understanding of instructions and care plan provided today and agrees to view in [place]. Active MyChart status and patient understanding of how to access instructions and care plan via MyChart confirmed with patient.     Telephone follow up appointment with care management team member scheduled for: 12/28/21  Elliot Gurney, [place] Worker  [facility] Care Management [phone]

## 2022-01-03 ENCOUNTER — Telehealth (HOSPITAL_COMMUNITY): Payer: Self-pay | Admitting: Licensed Clinical Social Worker

## 2022-01-03 ENCOUNTER — Ambulatory Visit: Payer: No Typology Code available for payment source | Admitting: Family

## 2022-01-03 NOTE — Telephone Encounter (Signed)
The therapist calls Dhyana due to having received a referrl from the work queue. He confirms her identity via two identifiers.  Janazia was detoxed a year ago at [facility] but says that she was not connected to any aftercare. She notes that she is interested in the once a month injection, Vivitrol. She says that if she does not drink that she will not sleep. She last drank a half of a 5th last night. She notes that when she has quit drinking before that she will develop the shakes.  The therapist educates her on the dangers of trying to quit drinking on her own, medication assisted treatment, and the substance use continuum of care.  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   She was unable to attend the IOP at the [facility] as it was not in-network for her insurance; however, she was told that [facility] is in-network such that she will not have to pay $70 per visit.   The therapist provides her with his direct callback number should she have any additional questions or concerns or need further assistance.  [address] MA, LCSW, [facility], LCAS 01/03/2022

## 2022-01-08 ENCOUNTER — Inpatient Hospital Stay
Admission: EM | Admit: 2022-01-08 | Discharge: 2022-01-12 | DRG: 871 | Disposition: A | Discharge status: Home / Self Care | Payer: PRIVATE HEALTH INSURANCE | Attending: Internal Medicine | Admitting: Internal Medicine

## 2022-01-08 ENCOUNTER — Other Ambulatory Visit: Payer: Self-pay

## 2022-01-08 ENCOUNTER — Emergency Department: Payer: PRIVATE HEALTH INSURANCE

## 2022-01-08 ENCOUNTER — Encounter: Payer: Self-pay | Admitting: Emergency Medicine

## 2022-01-08 DIAGNOSIS — Z882 Allergy status to sulfonamides status: Secondary | ICD-10-CM

## 2022-01-08 DIAGNOSIS — I10 Essential (primary) hypertension: Secondary | ICD-10-CM | POA: Diagnosis present

## 2022-01-08 DIAGNOSIS — Z881 Allergy status to other antibiotic agents status: Secondary | ICD-10-CM | POA: Diagnosis not present

## 2022-01-08 DIAGNOSIS — Z6832 Body mass index (BMI) 32.0-32.9, adult: Secondary | ICD-10-CM

## 2022-01-08 DIAGNOSIS — A419 Sepsis, unspecified organism: Principal | ICD-10-CM | POA: Diagnosis present

## 2022-01-08 DIAGNOSIS — F17210 Nicotine dependence, cigarettes, uncomplicated: Secondary | ICD-10-CM | POA: Diagnosis present

## 2022-01-08 DIAGNOSIS — E876 Hypokalemia: Secondary | ICD-10-CM | POA: Diagnosis present

## 2022-01-08 DIAGNOSIS — E871 Hypo-osmolality and hyponatremia: Secondary | ICD-10-CM | POA: Diagnosis present

## 2022-01-08 DIAGNOSIS — J45909 Unspecified asthma, uncomplicated: Secondary | ICD-10-CM | POA: Diagnosis present

## 2022-01-08 DIAGNOSIS — R1084 Generalized abdominal pain: Secondary | ICD-10-CM | POA: Diagnosis not present

## 2022-01-08 DIAGNOSIS — B9689 Other specified bacterial agents as the cause of diseases classified elsewhere: Secondary | ICD-10-CM | POA: Diagnosis present

## 2022-01-08 DIAGNOSIS — E669 Obesity, unspecified: Secondary | ICD-10-CM | POA: Diagnosis present

## 2022-01-08 DIAGNOSIS — R7989 Other specified abnormal findings of blood chemistry: Secondary | ICD-10-CM | POA: Diagnosis present

## 2022-01-08 DIAGNOSIS — D649 Anemia, unspecified: Secondary | ICD-10-CM | POA: Diagnosis present

## 2022-01-08 DIAGNOSIS — Z8249 Family history of ischemic heart disease and other diseases of the circulatory system: Secondary | ICD-10-CM

## 2022-01-08 DIAGNOSIS — N10 Acute tubulo-interstitial nephritis: Secondary | ICD-10-CM | POA: Diagnosis present

## 2022-01-08 DIAGNOSIS — F1010 Alcohol abuse, uncomplicated: Secondary | ICD-10-CM | POA: Diagnosis present

## 2022-01-08 DIAGNOSIS — F32A Depression, unspecified: Secondary | ICD-10-CM | POA: Diagnosis present

## 2022-01-08 DIAGNOSIS — R6510 Systemic inflammatory response syndrome (SIRS) of non-infectious origin without acute organ dysfunction: Secondary | ICD-10-CM

## 2022-01-08 DIAGNOSIS — K760 Fatty (change of) liver, not elsewhere classified: Secondary | ICD-10-CM | POA: Diagnosis present

## 2022-01-08 DIAGNOSIS — D7589 Other specified diseases of blood and blood-forming organs: Secondary | ICD-10-CM | POA: Diagnosis present

## 2022-01-08 DIAGNOSIS — K852 Alcohol induced acute pancreatitis without necrosis or infection: Secondary | ICD-10-CM | POA: Diagnosis present

## 2022-01-08 DIAGNOSIS — F419 Anxiety disorder, unspecified: Secondary | ICD-10-CM | POA: Diagnosis present

## 2022-01-08 DIAGNOSIS — I700 Atherosclerosis of aorta: Secondary | ICD-10-CM | POA: Diagnosis present

## 2022-01-08 DIAGNOSIS — K219 Gastro-esophageal reflux disease without esophagitis: Secondary | ICD-10-CM | POA: Diagnosis present

## 2022-01-08 DIAGNOSIS — K8500 Idiopathic acute pancreatitis without necrosis or infection: Secondary | ICD-10-CM

## 2022-01-08 DIAGNOSIS — K5900 Constipation, unspecified: Secondary | ICD-10-CM | POA: Diagnosis present

## 2022-01-08 DIAGNOSIS — E875 Hyperkalemia: Secondary | ICD-10-CM | POA: Diagnosis present

## 2022-01-08 DIAGNOSIS — Z91018 Allergy to other foods: Secondary | ICD-10-CM

## 2022-01-08 DIAGNOSIS — Z720 Tobacco use: Secondary | ICD-10-CM | POA: Diagnosis present

## 2022-01-08 DIAGNOSIS — Z79899 Other long term (current) drug therapy: Secondary | ICD-10-CM

## 2022-01-08 DIAGNOSIS — Z9049 Acquired absence of other specified parts of digestive tract: Secondary | ICD-10-CM

## 2022-01-08 DIAGNOSIS — R7401 Elevation of levels of liver transaminase levels: Secondary | ICD-10-CM | POA: Diagnosis present

## 2022-01-08 DIAGNOSIS — R112 Nausea with vomiting, unspecified: Principal | ICD-10-CM

## 2022-01-08 DIAGNOSIS — Z716 Tobacco abuse counseling: Secondary | ICD-10-CM

## 2022-01-08 DIAGNOSIS — Z7141 Alcohol abuse counseling and surveillance of alcoholic: Secondary | ICD-10-CM

## 2022-01-08 LAB — CBC WITH DIFFERENTIAL/PLATELET
Abs Immature Granulocytes: 0.07 10*3/uL (ref 0.00–0.07)
Basophils Absolute: 0.1 10*3/uL (ref 0.0–0.1)
Basophils Relative: 0 %
Eosinophils Absolute: 0.1 10*3/uL (ref 0.0–0.5)
Eosinophils Relative: 1 %
HCT: 42.1 % (ref 36.0–46.0)
Hemoglobin: 14.9 g/dL (ref 12.0–15.0)
Immature Granulocytes: 1 %
Lymphocytes Relative: 9 %
Lymphs Abs: 1.4 10*3/uL (ref 0.7–4.0)
MCH: 35.3 pg — ABNORMAL HIGH (ref 26.0–34.0)
MCHC: 35.4 g/dL (ref 30.0–36.0)
MCV: 99.8 fL (ref 80.0–100.0)
Monocytes Absolute: 1 10*3/uL (ref 0.1–1.0)
Monocytes Relative: 7 %
Neutro Abs: 12.3 10*3/uL — ABNORMAL HIGH (ref 1.7–7.7)
Neutrophils Relative %: 82 %
Platelets: 294 10*3/uL (ref 150–400)
RBC: 4.22 MIL/uL (ref 3.87–5.11)
RDW: 13.7 % (ref 11.5–15.5)
WBC: 14.8 10*3/uL — ABNORMAL HIGH (ref 4.0–10.5)
nRBC: 0 % (ref 0.0–0.2)

## 2022-01-08 LAB — URINALYSIS, ROUTINE W REFLEX MICROSCOPIC
Bacteria, UA: NONE SEEN
Bilirubin Urine: NEGATIVE
Glucose, UA: NEGATIVE mg/dL
Ketones, ur: 20 mg/dL — AB
Leukocytes,Ua: NEGATIVE
Nitrite: NEGATIVE
Protein, ur: 30 mg/dL — AB
Specific Gravity, Urine: >1.046 — ABNORMAL HIGH (ref 1.005–1.030)
pH: 6 (ref 5.0–8.0)

## 2022-01-08 LAB — HEPATITIS PANEL, ACUTE
HCV Ab: NONREACTIVE
Hep A IgM: NONREACTIVE
Hep B C IgM: NONREACTIVE
Hepatitis B Surface Ag: NONREACTIVE

## 2022-01-08 LAB — PROCALCITONIN: Procalcitonin: <0.1 ng/mL

## 2022-01-08 LAB — COMPREHENSIVE METABOLIC PANEL
ALT: 90 U/L — ABNORMAL HIGH (ref 0–44)
AST: 93 U/L — ABNORMAL HIGH (ref 15–41)
Albumin: 3.9 g/dL (ref 3.5–5.0)
Alkaline Phosphatase: 110 U/L (ref 38–126)
Anion gap: 11 (ref 5–15)
BUN: 8 mg/dL (ref 6–20)
CO2: 23 mmol/L (ref 22–32)
Calcium: 9.1 mg/dL (ref 8.9–10.3)
Chloride: 98 mmol/L (ref 98–111)
Creatinine, Ser: 0.53 mg/dL (ref 0.44–1.00)
GFR, Estimated: >60 mL/min (ref >60)
Glucose, Bld: 156 mg/dL — ABNORMAL HIGH (ref 70–99)
Potassium: 3.3 mmol/L — ABNORMAL LOW (ref 3.5–5.1)
Sodium: 132 mmol/L — ABNORMAL LOW (ref 135–145)
Total Bilirubin: 1.9 mg/dL — ABNORMAL HIGH (ref 0.3–1.2)
Total Protein: 8.2 g/dL — ABNORMAL HIGH (ref 6.5–8.1)

## 2022-01-08 LAB — LIPASE, BLOOD: Lipase: 304 U/L — ABNORMAL HIGH (ref 11–51)

## 2022-01-08 LAB — LACTIC ACID, PLASMA: Lactic Acid, Venous: 1.3 mmol/L (ref 0.5–1.9)

## 2022-01-08 LAB — PROTIME-INR
INR: 1.2 (ref 0.8–1.2)
Prothrombin Time: 14.8 seconds (ref 11.4–15.2)

## 2022-01-08 LAB — TRIGLYCERIDES: Triglycerides: 77 mg/dL (ref <150)

## 2022-01-08 LAB — HIV ANTIBODY (ROUTINE TESTING W REFLEX): HIV Screen 4th Generation wRfx: NONREACTIVE

## 2022-01-08 LAB — PREGNANCY, URINE: Preg Test, Ur: NEGATIVE

## 2022-01-08 LAB — PHOSPHORUS: Phosphorus: 2.8 mg/dL (ref 2.5–4.6)

## 2022-01-08 LAB — MAGNESIUM: Magnesium: 1.4 mg/dL — ABNORMAL LOW (ref 1.7–2.4)

## 2022-01-08 MED ORDER — SENNOSIDES-DOCUSATE SODIUM 8.6-50 MG PO TABS
1.0000 | ORAL_TABLET | Freq: Every evening | ORAL | Status: DC | PRN
  Administered 2022-01-09 – 2022-01-11 (×3): 1 via ORAL
  Filled 2022-01-08 (×3): qty 1

## 2022-01-08 MED ORDER — POTASSIUM CHLORIDE CRYS ER 20 MEQ PO TBCR
60.0000 meq | EXTENDED_RELEASE_TABLET | Freq: Once | ORAL | Status: AC
  Administered 2022-01-08: 60 meq via ORAL
  Filled 2022-01-08: qty 3

## 2022-01-08 MED ORDER — HYDROMORPHONE HCL 1 MG/ML IJ SOLN
0.5000 mg | Freq: Once | INTRAMUSCULAR | Status: AC
  Administered 2022-01-08: 0.5 mg via INTRAVENOUS
  Filled 2022-01-08: qty 0.5

## 2022-01-08 MED ORDER — IOHEXOL 300 MG/ML  SOLN
100.0000 mL | Freq: Once | INTRAMUSCULAR | Status: AC | PRN
  Administered 2022-01-08: 100 mL via INTRAVENOUS

## 2022-01-08 MED ORDER — LORAZEPAM 1 MG PO TABS
1.0000 mg | ORAL_TABLET | ORAL | Status: AC | PRN
  Administered 2022-01-09 – 2022-01-11 (×4): 1 mg via ORAL
  Filled 2022-01-08 (×4): qty 1

## 2022-01-08 MED ORDER — IBUPROFEN 400 MG PO TABS
200.0000 mg | ORAL_TABLET | Freq: Four times a day (QID) | ORAL | Status: DC | PRN
  Administered 2022-01-09: 200 mg via ORAL
  Filled 2022-01-08: qty 1

## 2022-01-08 MED ORDER — NICOTINE 21 MG/24HR TD PT24
21.0000 mg | MEDICATED_PATCH | Freq: Every day | TRANSDERMAL | Status: DC
  Administered 2022-01-08 – 2022-01-12 (×5): 21 mg via TRANSDERMAL
  Filled 2022-01-08 (×5): qty 1

## 2022-01-08 MED ORDER — SODIUM CHLORIDE 0.9 % IV SOLN: INTRAVENOUS | Status: DC

## 2022-01-08 MED ORDER — MORPHINE SULFATE (PF) 4 MG/ML IV SOLN
4.0000 mg | Freq: Once | INTRAVENOUS | Status: AC
  Administered 2022-01-08: 4 mg via INTRAVENOUS
  Filled 2022-01-08: qty 1

## 2022-01-08 MED ORDER — ONDANSETRON HCL 4 MG/2ML IJ SOLN
4.0000 mg | Freq: Three times a day (TID) | INTRAMUSCULAR | Status: DC | PRN
  Administered 2022-01-08 – 2022-01-11 (×2): 4 mg via INTRAVENOUS
  Filled 2022-01-08: qty 2

## 2022-01-08 MED ORDER — LORAZEPAM 2 MG/ML IJ SOLN
0.0000 mg | Freq: Two times a day (BID) | INTRAMUSCULAR | Status: DC
  Administered 2022-01-10 – 2022-01-11 (×2): 1 mg via INTRAVENOUS
  Filled 2022-01-08 (×4): qty 1

## 2022-01-08 MED ORDER — ENOXAPARIN SODIUM 60 MG/0.6ML IJ SOSY
0.5000 mg/kg | PREFILLED_SYRINGE | INTRAMUSCULAR | Status: DC
  Administered 2022-01-08 – 2022-01-12 (×5): 47.5 mg via SUBCUTANEOUS
  Filled 2022-01-08 (×5): qty 0.6

## 2022-01-08 MED ORDER — SODIUM CHLORIDE 0.9 % IV BOLUS
1000.0000 mL | Freq: Once | INTRAVENOUS | Status: AC
  Administered 2022-01-08: 1000 mL via INTRAVENOUS

## 2022-01-08 MED ORDER — LORAZEPAM 2 MG/ML IJ SOLN
0.0000 mg | Freq: Four times a day (QID) | INTRAMUSCULAR | Status: DC
  Administered 2022-01-09: 2 mg via INTRAVENOUS
  Administered 2022-01-09: 1 mg via INTRAVENOUS
  Filled 2022-01-08 (×3): qty 1

## 2022-01-08 MED ORDER — THIAMINE HCL 100 MG/ML IJ SOLN
100.0000 mg | Freq: Every day | INTRAMUSCULAR | Status: DC
  Administered 2022-01-08: 100 mg via INTRAVENOUS
  Filled 2022-01-08 (×2): qty 2

## 2022-01-08 MED ORDER — ALBUTEROL SULFATE (2.5 MG/3ML) 0.083% IN NEBU: 2.5000 mg | INHALATION_SOLUTION | RESPIRATORY_TRACT | Status: DC | PRN

## 2022-01-08 MED ORDER — THIAMINE MONONITRATE 100 MG PO TABS
100.0000 mg | ORAL_TABLET | Freq: Every day | ORAL | Status: DC
  Administered 2022-01-09 – 2022-01-12 (×4): 100 mg via ORAL
  Filled 2022-01-08 (×6): qty 1

## 2022-01-08 MED ORDER — PANTOPRAZOLE SODIUM 40 MG PO TBEC
40.0000 mg | DELAYED_RELEASE_TABLET | Freq: Every day | ORAL | Status: DC
  Administered 2022-01-08 – 2022-01-12 (×5): 40 mg via ORAL
  Filled 2022-01-08 (×5): qty 1

## 2022-01-08 MED ORDER — LORAZEPAM 2 MG/ML IJ SOLN
1.0000 mg | INTRAMUSCULAR | Status: AC | PRN
  Administered 2022-01-10 (×5): 2 mg via INTRAVENOUS
  Filled 2022-01-08 (×2): qty 1

## 2022-01-08 MED ORDER — HYDRALAZINE HCL 20 MG/ML IJ SOLN: 5.0000 mg | INTRAMUSCULAR | Status: DC | PRN

## 2022-01-08 MED ORDER — HYDROMORPHONE HCL 1 MG/ML IJ SOLN
1.0000 mg | INTRAMUSCULAR | Status: DC | PRN
  Administered 2022-01-08 – 2022-01-12 (×20): 1 mg via INTRAVENOUS
  Filled 2022-01-08 (×20): qty 1

## 2022-01-08 MED ORDER — DM-GUAIFENESIN ER 30-600 MG PO TB12: 1.0000 | ORAL_TABLET | Freq: Two times a day (BID) | ORAL | Status: DC | PRN

## 2022-01-08 MED ORDER — FOLIC ACID 1 MG PO TABS
1.0000 mg | ORAL_TABLET | Freq: Every day | ORAL | Status: DC
  Administered 2022-01-08 – 2022-01-12 (×5): 1 mg via ORAL
  Filled 2022-01-08 (×5): qty 1

## 2022-01-08 MED ORDER — ADULT MULTIVITAMIN W/MINERALS CH
1.0000 | ORAL_TABLET | Freq: Every day | ORAL | Status: DC
  Administered 2022-01-09 – 2022-01-12 (×4): 1 via ORAL
  Filled 2022-01-08 (×4): qty 1

## 2022-01-08 MED ORDER — SODIUM CHLORIDE 0.9 % IV SOLN
2.0000 g | INTRAVENOUS | Status: DC
  Administered 2022-01-08 – 2022-01-12 (×5): 2 g via INTRAVENOUS
  Filled 2022-01-08 (×3): qty 20
  Filled 2022-01-08: qty 2
  Filled 2022-01-08: qty 20

## 2022-01-08 MED ORDER — OXYCODONE HCL 5 MG PO TABS
5.0000 mg | ORAL_TABLET | ORAL | Status: DC | PRN
  Administered 2022-01-08 – 2022-01-12 (×15): 5 mg via ORAL
  Filled 2022-01-08 (×17): qty 1

## 2022-01-08 MED ORDER — POLYETHYLENE GLYCOL 3350 17 G PO PACK
17.0000 g | PACK | Freq: Every day | ORAL | Status: DC | PRN
  Administered 2022-01-09 – 2022-01-12 (×4): 17 g via ORAL
  Filled 2022-01-08 (×4): qty 1

## 2022-01-08 MED ORDER — VITAMIN B-12 1000 MCG PO TABS
1000.0000 ug | ORAL_TABLET | Freq: Every day | ORAL | Status: DC
  Administered 2022-01-08 – 2022-01-12 (×5): 1000 ug via ORAL
  Filled 2022-01-08: qty 1
  Filled 2022-01-08: qty 2
  Filled 2022-01-08 (×3): qty 1

## 2022-01-08 MED ORDER — ONDANSETRON HCL 4 MG/2ML IJ SOLN
4.0000 mg | Freq: Once | INTRAMUSCULAR | Status: AC
  Administered 2022-01-08: 4 mg via INTRAVENOUS
  Filled 2022-01-08: qty 2

## 2022-01-08 NOTE — ED Notes (Signed)
First Nurse Note: Pt to ED via [place] stating that she is having abdominal and back pain. Pt also reports vomiting and being unable to have a bowel movement.

## 2022-01-08 NOTE — ED Provider Notes (Signed)
[facility] Provider Note    Event Date/Time   First MD Initiated Contact with Patient 01/08/22 [phone]     (approximate)   History   Abdominal Pain, Back Pain, and Emesis   HPI  Tammy Decker is a 51 y.o. female who reports crampy abdominal pain with nausea and vomiting for 2 days.  She is not passing stool or gas.  Reports her belly is distended.  Pain radiates around to her back on the right side.    Several years ago she had an abdominal hernia repaired no other surgeries.  She is recently had bronchitis.      Physical Exam   Triage Vital Signs: ED Triage Vitals  Enc Vitals Group     BP 01/08/22 0735 (!) 163/112     Pulse Rate 01/08/22 0735 (!) 121     Resp 01/08/22 0735 18     Temp 01/08/22 0735 98.4 F (36.9 C)     Temp Source 01/08/22 0735 Oral     SpO2 01/08/22 0735 95 %     Weight 01/08/22 0736 210 lb (95.3 kg)     Height 01/08/22 0736 5\' 7"  (1.702 m)     Head Circumference --      Peak Flow --      Pain Score 01/08/22 0735 10     Pain Loc --      Pain Edu? --      Excl. in [place]? --     Most recent vital signs: Vitals:   01/08/22 0830 01/08/22 1149  BP: (!) 160/99 (!) 152/93  Pulse: 100 (!) 122  Resp:  19  Temp:  98.3 F (36.8 C)  SpO2:  93%     General: Awake, comfortable CV:  Good peripheral perfusion.  Regular rate and rhythm tacky, no murmurs Resp:  Normal effort.  Lungs are clear Abd:  Distended tender to mild palpation diffusely. Back tender below the right CVA area   ED Results / Procedures / Treatments   Labs (all labs ordered are listed, but only abnormal results are displayed) Labs Reviewed  COMPREHENSIVE METABOLIC PANEL - Abnormal; Notable for the following components:      Result Value   Sodium 132 (*)    Potassium 3.3 (*)    Glucose, Bld 156 (*)    Total Protein 8.2 (*)    AST 93 (*)    ALT 90 (*)    Total Bilirubin 1.9 (*)    All other components within normal limits  LIPASE, BLOOD -  Abnormal; Notable for the following components:   Lipase 304 (*)    All other components within normal limits  CBC WITH DIFFERENTIAL/PLATELET - Abnormal; Notable for the following components:   WBC 14.8 (*)    MCH 35.3 (*)    Neutro Abs 12.3 (*)    All other components within normal limits  URINALYSIS, ROUTINE W REFLEX MICROSCOPIC - Abnormal; Notable for the following components:   Color, Urine YELLOW (*)    APPearance HAZY (*)    Specific Gravity, Urine >1.046 (*)    Hgb urine dipstick SMALL (*)    Ketones, ur 20 (*)    Protein, ur 30 (*)    All other components within normal limits  MAGNESIUM - Abnormal; Notable for the following components:   Magnesium 1.4 (*)    All other components within normal limits  CULTURE, BLOOD (ROUTINE X 2)  CULTURE, BLOOD (ROUTINE X 2)  URINE CULTURE  LACTIC  ACID, PLASMA  TRIGLYCERIDES  PHOSPHORUS  PROCALCITONIN  PREGNANCY, URINE  HIV ANTIBODY (ROUTINE TESTING W REFLEX)  HEPATITIS PANEL, ACUTE  PROTIME-INR     EKG  EKG read interpreted by me shows sinus tachycardia at a rate of 105 normal axis no acute ST-T wave changes there is an S1Q3T3 with the patient does not have any shortness of breath or chest pain pleuritic or otherwise.   RADIOLOGY  CT read by radiology and interpreted by me shows pancreatitis.  Additionally there is a small hypo-density in the left kidney that the radiologist feels could be pyelonephritis.  PROCEDURES:  Critical Care performed: Critical care time 20 minutes.  This includes giving patient 3 doses of pain medication reviewing her studies and I am talking to the hospitalist.  Procedures   MEDICATIONS ORDERED IN ED: Medications  oxyCODONE (Oxy IR/ROXICODONE) immediate release tablet 5 mg (has no administration in time range)  HYDROmorphone (DILAUDID) injection 1 mg (1 mg Intravenous Given 01/08/22 1144)  0.9 %  sodium chloride infusion (has no administration in time range)  ondansetron (ZOFRAN) injection 4 mg  (4 mg Intravenous Given 01/08/22 1242)  hydrALAZINE (APRESOLINE) injection 5 mg (has no administration in time range)  nicotine (NICODERM CQ - dosed in mg/24 hours) patch 21 mg (21 mg Transdermal Patch Applied 01/08/22 1001)  albuterol (PROVENTIL) (2.5 MG/3ML) 0.083% nebulizer solution 2.5 mg (has no administration in time range)  dextromethorphan-guaiFENesin (MUCINEX DM) 30-600 MG per 12 hr tablet 1 tablet (has no administration in time range)  enoxaparin (LOVENOX) injection 47.5 mg (47.5 mg Subcutaneous Given 01/08/22 1252)  LORazepam (ATIVAN) tablet 1-4 mg (has no administration in time range)    Or  LORazepam (ATIVAN) injection 1-4 mg (has no administration in time range)  thiamine (VITAMIN B1) tablet 100 mg ( Oral See Alternative 01/08/22 1241)    Or  thiamine (VITAMIN B1) injection 100 mg (100 mg Intravenous Given 01/08/22 1241)  folic acid (FOLVITE) tablet 1 mg (1 mg Oral Given 01/08/22 1243)  multivitamin with minerals tablet 1 tablet (has no administration in time range)  LORazepam (ATIVAN) injection 0-4 mg (has no administration in time range)    Followed by  LORazepam (ATIVAN) injection 0-4 mg (has no administration in time range)  ibuprofen (ADVIL) tablet 200 mg (has no administration in time range)  senna-docusate (Senokot-S) tablet 1 tablet (has no administration in time range)  polyethylene glycol (MIRALAX / GLYCOLAX) packet 17 g (has no administration in time range)  cefTRIAXone (ROCEPHIN) 2 g in sodium chloride 0.9 % 100 mL IVPB (2 g Intravenous New Bag/Given 01/08/22 1255)  pantoprazole (PROTONIX) EC tablet 40 mg (40 mg Oral Given 01/08/22 1142)  cyanocobalamin (VITAMIN B12) tablet 1,000 mcg (1,000 mcg Oral Given 01/08/22 1244)  morphine (PF) 4 MG/ML injection 4 mg (4 mg Intravenous Given 01/08/22 0758)  ondansetron (ZOFRAN) injection 4 mg (4 mg Intravenous Given 01/08/22 0757)  sodium chloride 0.9 % bolus 1,000 mL (1,000 mLs Intravenous New Bag/Given 01/08/22 0758)  morphine (PF) 4  MG/ML injection 4 mg (4 mg Intravenous Given 01/08/22 0826)  iohexol (OMNIPAQUE) 300 MG/ML solution 100 mL (100 mLs Intravenous Contrast Given 01/08/22 0834)  HYDROmorphone (DILAUDID) injection 0.5 mg (0.5 mg Intravenous Given 01/08/22 1000)  potassium chloride SA (KLOR-CON M) CR tablet 60 mEq (60 mEq Oral Given 01/08/22 1001)  sodium chloride 0.9 % bolus 1,000 mL (1,000 mLs Intravenous New Bag/Given 01/08/22 1238)     IMPRESSION / MDM / ASSESSMENT AND PLAN / ED COURSE  I  reviewed the triage vital signs and the nursing notes. Patient has gotten 2 doses of morphine we will give her some Dilaudid half milligram now.  Her lipase is elevated and CT looks like pancreatitis or stranding around the head of the pancreas.  Urine is pending.  Differential diagnosis includes, but is not limited to, bowel obstruction was in the differential as was gastroenteritis or colitis.  History of diagnosis.  Patient's presentation is most consistent with acute presentation with potential threat to life or bodily function.  The patient is on the cardiac monitor to evaluate for evidence of arrhythmia and/or significant heart rate changes.  None have been seen    FINAL CLINICAL IMPRESSION(S) / ED DIAGNOSES   Final diagnoses:  Nausea and vomiting, unspecified vomiting type  Generalized abdominal pain  Idiopathic acute pancreatitis without infection or necrosis     Rx / DC Orders   ED Discharge Orders     None        Note:  This document was prepared using Dragon voice recognition software and may include unintentional dictation errors.   Arnaldo Natal, MD 01/08/22 [phone]

## 2022-01-08 NOTE — Progress Notes (Signed)
PHARMACIST - PHYSICIAN COMMUNICATION  CONCERNING:  Enoxaparin (Lovenox) for DVT Prophylaxis    RECOMMENDATION: Patient was prescribed enoxaprin 40mg  q24 hours for VTE prophylaxis.   Filed Weights   01/08/22 0736  Weight: 95.3 kg (210 lb)    Body mass index is 32.89 kg/m.  Estimated Creatinine Clearance: 100.9 mL/min (by C-G formula based on SCr of 0.53 mg/dL).   Based on [place] patient is candidate for enoxaparin 0.5mg /kg TBW SQ every 24 hours based on BMI being >30.  DESCRIPTION: Pharmacy has adjusted enoxaparin dose per [facility] policy.  Patient is now receiving enoxaparin 47.5 mg every 24 hours    Lorin Picket, PharmD Clinical Pharmacist  01/08/2022 9:59 AM

## 2022-01-08 NOTE — ED Triage Notes (Signed)
Pt to ER with c/o abdominal and back pain with n/v and constipation.  Pt reports pain x 2 days, taking OTC meds with no relief.  Pt denies noted fever.

## 2022-01-08 NOTE — ED Notes (Signed)
Pt to CT now

## 2022-01-08 NOTE — H&P (Addendum)
History and Physical    Tammy Decker IRW:[id number] DOB: [birth year] DOA: 01/08/2022  Referring MD/NP/PA:   PCP: Tammy Sawyers, FNP   Patient coming from:  The patient is coming from home.  At baseline, pt is independent for most of ADL.        Chief Complaint: Abdominal pain  HPI: Tammy Decker is a 51 y.o. female with medical history significant of alcohol abuse, tobacco abuse, hypertension, asthma, GERD, depression with anxiety, obesity obesity BMI 32.89, who presents with abdominal pain.  Patient states that she has abdominal pain for more than 2 days, which has been progressively worsening.  Associated with nausea and vomiting.  Patient's has had multiple episodes of nonbilious nonbloody vomiting. The abdominal pain is located in the upper abdomen, cramping like, sharp, severe, radiating to the right flank area.  Patient is constipated, no diarrhea.  Patient does not have chest pain, cough, shortness breath.  Patient has increased urinary frequency, no dysuria or burning on urination.  Data reviewed independently and ED Course: pt was found to have lipase 3 4, abnormal liver function (ALP 110, AST 93, ALT 19, total bilirubin 1.9), WBC 14.8, lactic acid 1.3, urinalysis (hazy appearance, negative leukocyte, negative bacteria, WBC 6-10) GFR> 60, seems 3.3, temperature normal, blood pressure 160/99, heart rate 121, RR 19, oxygen saturation 96% on room air.  Patient is admitted to tele bed as inpt  CT scan of abdomen/pelvis: There is stranding in the fat planes adjacent to the head of the pancreas and duodenum suggesting acute pancreatitis and possibly duodenitis. Please correlate with laboratory findings. There is no evidence of focal pancreatic necrosis.   There is no evidence of intestinal obstruction or pneumoperitoneum. There is no hydronephrosis.   There is 2 cm area of subtle decreased density in the posterior upper pole of left kidney which may be a partial volume  averaging artifact or suggest early changes of pyelonephritis.   Enlarged fatty liver. Possible small nonobstructing left renal stones. Possible small subcentimeter right renal cysts. Aortic arteriosclerosis. There are low-density structures in both adnexal regions in pelvis, possibly follicles or functional cysts in the ovaries.    EKG: I have personally reviewed.  Sinus rhythm, QTc 489, possible left atrial enlargement, poor R wave progression   Review of Systems:   General: no fevers, chills, no body weight gain, has poor appetite, has fatigue HEENT: no blurry vision, hearing changes or sore throat Respiratory: no dyspnea, coughing, wheezing CV: no chest pain, no palpitations GI: has nausea, vomiting, abdominal pain, constipation GU: no dysuria, burning on urination, increased urinary frequency, hematuria  Ext: no leg edema Neuro: no unilateral weakness, numbness, or tingling, no vision change or hearing loss Skin: no rash, no skin tear. MSK: No muscle spasm, no deformity, no limitation of range of movement in spin.  Has right flank pain Heme: No easy bruising.  Travel history: No recent long distant travel.   Allergy:  Allergies  Allergen Reactions   Ciprofloxacin Anaphylaxis   Kiwi Extract Anaphylaxis   Sulfa Antibiotics Hives    Past Medical History:  Diagnosis Date   Asthma    Complication of anesthesia    OXYGEN SOMETIMES DROPS LOW   GERD (gastroesophageal reflux disease)    Hypertension    Tachycardia 2020    Past Surgical History:  Procedure Laterality Date   BACK SURGERY     CHOLECYSTECTOMY     ESOPHAGOGASTRODUODENOSCOPY (EGD) WITH PROPOFOL N/A 06/19/2021   Procedure: ESOPHAGOGASTRODUODENOSCOPY (EGD) WITH PROPOFOL;  Surgeon:  Toney Reil, MD;  Location: [facility] ENDOSCOPY;  Service: Gastroenterology;  Laterality: N/A;   NECK SURGERY     uterine ablation     XI ROBOTIC ASSISTED VENTRAL HERNIA N/A 05/09/2020   Procedure: XI ROBOTIC ASSISTED VENTRAL  HERNIA, incisional hernia;  Surgeon: Leafy Ro, MD;  Location: [facility] ORS;  Service: General;  Laterality: N/A;    Social History:  reports that she has been smoking cigarettes. She has been smoking an average of .15 packs per day. She has been exposed to tobacco smoke. She has never used smokeless tobacco. She reports current alcohol use. She reports that she does not use drugs.  Family History:  Family History  Problem Relation Age of Onset   Diabetes Mother    Diabetes Father    Heart attack Father    Ovarian cancer Maternal Grandmother        older dx maybe early 82s   Colon cancer Maternal Grandfather        in his 21's   Early death Paternal Grandfather    Heart attack Paternal Grandfather      Prior to Admission medications   Medication Sig Start Date End Date Taking? Authorizing Provider  albuterol (PROAIR HFA) 108 (90 Base) MCG/ACT inhaler Inhale 2 puffs into the lungs every 6 (six) hours as needed for wheezing or shortness of breath. 12/26/21   Bedsole, Amy E, MD  azithromycin (ZITHROMAX) 250 MG tablet 2 tab po x 1 day then 1 tab po daily 12/26/21   Bedsole, Amy E, MD  cyanocobalamin (VITAMIN B12) 1000 MCG tablet Take 1,000 mcg by mouth daily.    [provider]  folic acid (FOLVITE) 1 MG tablet Take 1 tablet (1 mg total) by mouth daily. 12/24/21   Tammy Sawyers, FNP  losartan (COZAAR) 100 MG tablet Take 1 tablet (100 mg total) by mouth daily. 12/07/21   Tammy Sawyers, FNP  omeprazole (PRILOSEC) 40 MG capsule Take 1 capsule (40 mg total) by mouth daily. 12/07/21   Tammy Sawyers, FNP  predniSONE (DELTASONE) 10 MG tablet 3 tabs by mouth daily x 3 days, then 2 tabs by mouth daily x 2 days then 1 tab by mouth daily x 2 days 12/26/21   Excell Seltzer, MD    Physical Exam: Vitals:   01/08/22 0736 01/08/22 0805 01/08/22 0808 01/08/22 0830  BP:  (!) 146/87  (!) 160/99  Pulse:  (!) 111 (!) 108 100  Resp:  19 15   Temp:      TempSrc:      SpO2:  95% 96%   Weight:  95.3 kg     Height: 5\' 7"  (1.702 m)      General: Not in acute distress HEENT:       Eyes: PERRL, EOMI, no scleral icterus.       ENT: No discharge from the ears and nose, no pharynx injection, no tonsillar enlargement.        Neck: No JVD, no bruit, no mass felt. Heme: No neck lymph node enlargement. Cardiac: S1/S2, RRR, No murmurs, No gallops or rubs. Respiratory: No rales, wheezing, rhonchi or rubs. GI: Soft, nondistended, has tenderness in the upper abdomen, no rebound pain, no organomegaly, BS present. GU: No hematuria.  Has right CVA tenderness Ext: No pitting leg edema bilaterally. 1+DP/PT pulse bilaterally. Musculoskeletal: No joint deformities, No joint redness or warmth, no limitation of ROM in spin. Skin: No rashes.  Neuro: Alert, oriented X3, cranial nerves II-XII grossly intact, moves all  extremities normally.  Psych: Patient is not psychotic, no suicidal or hemocidal ideation.  Labs on Admission: I have personally reviewed following labs and imaging studies  CBC: Recent Labs  Lab 01/08/22 0747  WBC 14.8*  NEUTROABS 12.3*  HGB 14.9  HCT 42.1  MCV 99.8  PLT 294   Basic Metabolic Panel: Recent Labs  Lab 01/08/22 0747 01/08/22 0952  NA 132*  --   K 3.3*  --   CL 98  --   CO2 23  --   GLUCOSE 156*  --   BUN 8  --   CREATININE 0.53  --   CALCIUM 9.1  --   MG  --  1.4*  PHOS  --  2.8   GFR: Estimated Creatinine Clearance: 100.9 mL/min (by C-G formula based on SCr of 0.53 mg/dL). Liver Function Tests: Recent Labs  Lab 01/08/22 0747  AST 93*  ALT 90*  ALKPHOS 110  BILITOT 1.9*  PROT 8.2*  ALBUMIN 3.9   Recent Labs  Lab 01/08/22 0747  LIPASE 304*   No results for input(s): "AMMONIA" in the last 168 hours. Coagulation Profile: No results for input(s): "INR", "PROTIME" in the last 168 hours. Cardiac Enzymes: No results for input(s): "CKTOTAL", "CKMB", "CKMBINDEX", "TROPONINI" in the last 168 hours. BNP (last 3 results) No results for input(s):  "PROBNP" in the last 8760 hours. HbA1C: No results for input(s): "HGBA1C" in the last 72 hours. CBG: No results for input(s): "GLUCAP" in the last 168 hours. Lipid Profile: Recent Labs    01/08/22 0747  TRIG 77   Thyroid Function Tests: No results for input(s): "TSH", "T4TOTAL", "FREET4", "T3FREE", "THYROIDAB" in the last 72 hours. Anemia Panel: No results for input(s): "VITAMINB12", "FOLATE", "FERRITIN", "TIBC", "IRON", "RETICCTPCT" in the last 72 hours. Urine analysis:    Component Value Date/Time   COLORURINE YELLOW (A) 01/08/2022 0747   APPEARANCEUR HAZY (A) 01/08/2022 0747   LABSPEC >1.046 (H) 01/08/2022 0747   PHURINE 6.0 01/08/2022 0747   GLUCOSEU NEGATIVE 01/08/2022 0747   HGBUR SMALL (A) 01/08/2022 0747   BILIRUBINUR NEGATIVE 01/08/2022 0747   BILIRUBINUR Negative 12/07/2021 1602   KETONESUR 20 (A) 01/08/2022 0747   PROTEINUR 30 (A) 01/08/2022 0747   UROBILINOGEN 0.2 12/07/2021 1602   NITRITE NEGATIVE 01/08/2022 0747   LEUKOCYTESUR NEGATIVE 01/08/2022 0747   Sepsis Labs: @LABRCNTIP (procalcitonin:4,lacticidven:4) )No results found for this or any previous visit (from the past 240 hour(s)).   Radiological Exams on Admission: CT ABDOMEN PELVIS W CONTRAST  Result Date: 01/08/2022 CLINICAL DATA:  Abdominal pain, constipation EXAM: CT ABDOMEN AND PELVIS WITH CONTRAST TECHNIQUE: Multidetector CT imaging of the abdomen and pelvis was performed using the standard protocol following bolus administration of intravenous contrast. RADIATION DOSE REDUCTION: This exam was performed according to the departmental dose-optimization program which includes automated exposure control, adjustment of the mA and/or kV according to patient size and/or use of iterative reconstruction technique. CONTRAST:  13/09/2021 OMNIPAQUE IOHEXOL 300 MG/ML  SOLN COMPARISON:  05/11/2021 FINDINGS: Lower chest: Heart is enlarged in size. There are small linear densities in both lower lung fields suggesting minimal  scarring or subsegmental atelectasis. Hepatobiliary: Liver measures 23.3 cm in length. There is marked fatty infiltration. There is no dilation of bile ducts. Surgical clips are seen in gallbladder fossa. Pancreas: There is stranding in the fat planes adjacent to the head of the pancreas and duodenum. There is mild wall thickening in duodenum. Peripancreatic stranding is extending into the mesentery to the right lower quadrant. There are  no loculated fluid collections in or around the pancreas. Spleen: Spleen is not enlarged. Small calcified granulomas are seen in spleen. Adrenals/Urinary Tract: Adrenals are unremarkable. There is no hydronephrosis. Few subcentimeter low-density foci are seen in right renal cortex, possibly cysts. There is 2 cm focus of subtle decreased density in the posterior upper pole of left kidney. There are 2 small calcifications in the lower pole of left kidney each measuring less than 3 mm, possibly nonobstructing renal stones. Urinary bladder is not distended. Stomach/Bowel: Stomach is unremarkable. Small bowel loops are not dilated. Appendix is not dilated. There is no significant wall thickening in colon. There is no pericolic stranding. Vascular/Lymphatic: Scattered arterial calcifications are seen. Reproductive: Mild lobulations are seen in the margin of the uterus. There is 2.5 cm fluid density structure in left adnexa. There is 1.8 cm fluid density structure in the right adnexa. Other: There is no ascites or pneumoperitoneum. Musculoskeletal: Neurostimulator lead is seen in lower thoracic spinal canal. IMPRESSION: There is stranding in the fat planes adjacent to the head of the pancreas and duodenum suggesting acute pancreatitis and possibly duodenitis. Please correlate with laboratory findings. There is no evidence of focal pancreatic necrosis. There is no evidence of intestinal obstruction or pneumoperitoneum. There is no hydronephrosis. There is 2 cm area of subtle decreased  density in the posterior upper pole of left kidney which may be a partial volume averaging artifact or suggest early changes of pyelonephritis. Enlarged fatty liver. Possible small nonobstructing left renal stones. Possible small subcentimeter right renal cysts. Aortic arteriosclerosis. There are low-density structures in both adnexal regions in pelvis, possibly follicles or functional cysts in the ovaries. Other findings as described in the body of the report. Electronically Signed   By: Ernie AvenaPalani  Rathinasamy M.D.   On: 01/08/2022 09:06      Assessment/Plan Principal Problem:   Alcoholic pancreatitis Active Problems:   Sepsis (HCC)   Acute pyelonephritis   Essential hypertension   Hypokalemia   Asthma   GERD (gastroesophageal reflux disease)   Abnormal LFTs   Alcohol abuse   Tobacco abuse   Obesity with body mass index (BMI) of 30.0 to 39.9   Assessment and Plan:  Alcoholic pancreatitis: Patient's abdominal pain, nausea and vomiting are due to alcoholic pancreatitis.  Lipase 304.  There is no evidence of focal pancreatic necrosis by CT scan.  -admit to tele bed as inpt -Pain control: As needed Dilaudid, oxycodone -IV fluid: 2 L normal saline, then 150 cc/h -Repeat lipase in the morning -Check triglyceride TG level  Sepsis due to possible acute pyelonephritis: Patient meets criteria for sepsis with WBC 14.8, heart rate 121, lactic acid is normal.  Patient reports increased urinary frequency, right flank pain, urinalysis showed hazy appearance with WBC 6-10, indicating possible acute pyelonephritis.  -Rocephin IV -IV fluid as above -Check procalcitonin level -Follow-up blood culture and urine culture  Essential hypertension -IV hydralazine as needed -Hold Cozaar due to pancreatitis and sepsis  Hypokalemia: K 3.3 -Repleted potassium -Check magnesium level -Check phosphorus level  Asthma: Stable -As needed bronch dilators  GERD (gastroesophageal reflux  disease) -Protonix  Abnormal LFTs: Likely due to alcohol abuse -Avoid using Tylenol -Check hepatitis panel and HIV antibody  Alcohol abuse and Tobacco abuse -Did counseling about importance of quitting alcohol and tobacco use -Nicotine patch -CIWA protocol  Obesity with body mass index (BMI) of 30.0 to 39.9: BMI= 32.89  and BW= 95.3kg -Diet and exercise.   -Encourage to lose weight.    DVT  ppx:  SQ Lovenox  Code Status: Full code  Family Communication: not done, no family member is at bed side.    Disposition Plan:  Anticipate discharge back to previous environment  Consults called:  none  Admission status and Level of care: Telemetry Medical:  as inpt   Dispo: The patient is from: Home              Anticipated d/c is to: Home              Anticipated d/c date is: 2 days              Patient currently is not medically stable to d/c.    Severity of Illness:  The appropriate patient status for this patient is INPATIENT. Inpatient status is judged to be reasonable and necessary in order to provide the required intensity of service to ensure the patient's safety. The patient's presenting symptoms, physical exam findings, and initial radiographic and laboratory data in the context of their chronic comorbidities is felt to place them at high risk for further clinical deterioration. Furthermore, it is not anticipated that the patient will be medically stable for discharge from the hospital within 2 midnights of admission.   * I certify that at the point of admission it is my clinical judgment that the patient will require inpatient hospital care spanning beyond 2 midnights from the point of admission due to high intensity of service, high risk for further deterioration and high frequency of surveillance required.*   Date of Service 01/08/2022    Ivor Costa [facility]   If 7PM-7AM, please contact night-coverage [email or web address] 01/08/2022, 11:42 AM

## 2022-01-09 DIAGNOSIS — K852 Alcohol induced acute pancreatitis without necrosis or infection: Secondary | ICD-10-CM | POA: Diagnosis not present

## 2022-01-09 LAB — BASIC METABOLIC PANEL
Anion gap: 9 (ref 5–15)
BUN: 8 mg/dL (ref 6–20)
CO2: 25 mmol/L (ref 22–32)
Calcium: 8.1 mg/dL — ABNORMAL LOW (ref 8.9–10.3)
Chloride: 100 mmol/L (ref 98–111)
Creatinine, Ser: 0.44 mg/dL (ref 0.44–1.00)
GFR, Estimated: >60 mL/min (ref >60)
Glucose, Bld: 126 mg/dL — ABNORMAL HIGH (ref 70–99)
Potassium: 3.5 mmol/L (ref 3.5–5.1)
Sodium: 134 mmol/L — ABNORMAL LOW (ref 135–145)

## 2022-01-09 LAB — CBC
HCT: 37.1 % (ref 36.0–46.0)
Hemoglobin: 12.7 g/dL (ref 12.0–15.0)
MCH: 35.6 pg — ABNORMAL HIGH (ref 26.0–34.0)
MCHC: 34.2 g/dL (ref 30.0–36.0)
MCV: 103.9 fL — ABNORMAL HIGH (ref 80.0–100.0)
Platelets: 211 10*3/uL (ref 150–400)
RBC: 3.57 MIL/uL — ABNORMAL LOW (ref 3.87–5.11)
RDW: 14.1 % (ref 11.5–15.5)
WBC: 13.9 10*3/uL — ABNORMAL HIGH (ref 4.0–10.5)
nRBC: 0 % (ref 0.0–0.2)

## 2022-01-09 LAB — GLUCOSE, CAPILLARY: Glucose-Capillary: 128 mg/dL — ABNORMAL HIGH (ref 70–99)

## 2022-01-09 LAB — LIPASE, BLOOD: Lipase: 84 U/L — ABNORMAL HIGH (ref 11–51)

## 2022-01-09 MED ORDER — MAGNESIUM SULFATE 4 GM/100ML IV SOLN
4.0000 g | Freq: Once | INTRAVENOUS | Status: AC
  Administered 2022-01-09: 4 g via INTRAVENOUS
  Filled 2022-01-09 (×2): qty 100

## 2022-01-09 NOTE — Plan of Care (Signed)
  Problem: Education: Goal: Knowledge of General Education information will improve Description Including pain rating scale, medication(s)/side effects and non-pharmacologic comfort measures Outcome: Progressing   Problem: Health Behavior/Discharge Planning: Goal: Ability to manage health-related needs will improve Outcome: Progressing   

## 2022-01-09 NOTE — Progress Notes (Addendum)
Mobility Specialist - Progress Note   01/09/22 1050  Mobility  Activity Ambulated independently in hallway  Level of Assistance Independent  Assistive Device None  Distance Ambulated (ft) 180 ft  Activity Response Tolerated well  Mobility Referral Yes  $Mobility charge 1 Mobility    Pt ambulated 180 ft in the hallway indep, tolerated well. Pt returned to room with needs within reach, family present at bedside.   Zetta Bills Mobility Specialist 01/09/22 10:52 AM

## 2022-01-09 NOTE — Hospital Course (Addendum)
72F, hx of alcohol abuse, tobacco abuse, hypertension, asthma, GERD, depression with anxiety, obesity obesity BMI 32.89, presented with abdominal pain of 2 days. In the ED had CT scan abdomen pelvis-see report for detail-findings suggestive of acute pancreatitis possibly duodenitis no intestinal obstruction, 2 cm of subtle decreased density in the posterior upper pole of the left kidney-which could be artifact or early changes of pyelonephritis.  Enlarged fatty liver Labs with hyponatremia hyperkalemia transaminitis lipase 304 normal lactic acid procalcitonin, TG, mild leukocytosis Patient was admitted

## 2022-01-09 NOTE — Progress Notes (Signed)
CCMD called stating that the patient's heart rate was in the 140s. Went into patient's room and she was out f the bed standing up. She is alert and oriented x4 and seems independent. Stated that her heart rate becomes elevated when she gets up and move around. Notified patient's nurse Maricar.

## 2022-01-09 NOTE — Progress Notes (Signed)
Pt with very little sleep throughout the night, received ativan x1. Very little relief from back pain, requiring both prn and dilaudid and oxycodone throughout the night. Pt requesting a heat pack for back, secure chat to attending Dr. Dayna Barker for possible kpad.

## 2022-01-09 NOTE — Progress Notes (Signed)
PROGRESS NOTE Tammy Decker  [id number] DOB: 08-23-1970 DOA: 01/08/2022 PCP: Tammy Pancoast, FNP   Brief Narrative/Hospital Course: 36F, hx of alcohol abuse, tobacco abuse, hypertension, asthma, GERD, depression with anxiety, obesity obesity BMI 32.89, presented with abdominal pain of 2 days. In the ED had CT scan abdomen pelvis-see report for detail-findings suggestive of acute pancreatitis possibly duodenitis no intestinal obstruction, 2 cm of subtle decreased density in the posterior upper pole of the left kidney-which could be artifact or early changes of pyelonephritis.  Enlarged fatty liver Labs with hyponatremia hyperkalemia transaminitis lipase 304 normal lactic acid procalcitonin, TG, mild leukocytosis Patient was admitted   Subjective: Seen and examined this morning.  She denies any nausea, vomiting.  Complains of constipation last bowel movement 6 days ago took MiraLAX just now, has some right flank pain radiates to the epigastrium. Tolerating clear liquid diet. Overnight afebrile Labs with sodium 134 stable renal function, leukocytosis downtrending   Assessment and Plan: Principal Problem:   Alcoholic pancreatitis Active Problems:   Sepsis ([place])   Acute pyelonephritis   Essential hypertension   Hypokalemia   Asthma   GERD (gastroesophageal reflux disease)   Abnormal LFTs   Alcohol abuse   Tobacco abuse   Obesity with body mass index (BMI) of 30.0 to 39.9   Acute alcohol induced pancreatitis Transaminitis: Continue current supportive care with IV fluids antiemetics pain control.  CT scan reviewed.  Lipase improving TG normal.  Discussed alcohol cessation.  Tolerating CLD advance to FLD, trend LFTs Recent Labs  Lab 01/08/22 0747 01/08/22 1540  AST 93*  --   ALT 90*  --   ALKPHOS 110  --   BILITOT 1.9*  --   PROT 8.2*  --   ALBUMIN 3.9  --   INR  --  1.2    Sepsis POA due to to pyelonephritis, with leukocytosis patient having urinary symptoms UA  abnormal.  On empiric antibiotics ceftriaxone follow-up culture.  Hemodynamically stable  Hypomagnesemia 1.411/7 was not replaced, replete and recheck. Alcohol abuse at risk of withdrawal:Continue thiamine, PPI, folate CIWA Ativan watch for withdrawals. Tobacco abuse: Cessation advised Hypertension: BP well controlled.  Home losartan on hold Hypokalemia: Monitor and replete Asthma not in exacerbation GERD: Continue Class I Obesity:Patient's Body mass index is 32.89 kg/m. : Will benefit with PCP follow-up, weight loss  healthy lifestyle and outpatient sleep evaluation.  DVT prophylaxis: On Lovenox Code Status:   Code Status: Full Code Family Communication: plan of care discussed with patient at bedside. Patient status is: Inpatient because of pyelonephritis and pancreatitis Level of care: Telemetry Medical  Dispo: The patient is from: Home            Anticipated disposition: Home in 2 to 3 days  Mobility Assessment (last 72 hours)     Mobility Assessment     Row Name 01/08/22 2100 01/08/22 1542 01/08/22 1200       Does patient have an order for bedrest or is patient medically unstable No - Continue assessment -- No - Continue assessment     What is the highest level of mobility based on the progressive mobility assessment? Level 6 (Walks independently in room and hall) - Balance while walking in room without assist - Complete Level 6 (Walks independently in room and hall) - Balance while walking in room without assist - Complete Level 6 (Walks independently in room and hall) - Balance while walking in room without assist - Complete  Objective: Vitals last 24 hrs: Vitals:   01/08/22 2109 01/09/22 0413 01/09/22 0510 01/09/22 0754  BP: (!) 143/82 132/87 128/88 117/71  Pulse: (!) 105 96 93 (!) 101  Resp: 20 20  20   Temp: 98.8 F (37.1 C) (!) 97.4 F (36.3 C)  98 F (36.7 C)  TempSrc: Oral Oral  Oral  SpO2: 96% 98%  97%  Weight:      Height:       Weight  change:   Physical Examination: General exam: alert awake, older than stated age HEENT:Oral mucosa moist, Ear/Nose WNL grossly Respiratory system: bilaterally clear BS, no use of accessory muscle Cardiovascular system: S1 & S2 +, No JVD. Gastrointestinal system: Abdomen soft,NT,ND, BS+ Nervous System:Alert, awake, moving her extremities. Extremities: LE edema neg,distal peripheral pulses palpable.  Skin: No rashes,no icterus. MSK: Normal muscle bulk,tone, power  Medications reviewed:  Scheduled Meds:  cyanocobalamin  1,000 mcg Oral Daily   enoxaparin (LOVENOX) injection  0.5 mg/kg Subcutaneous Q24H   folic acid  1 mg Oral Daily   LORazepam  0-4 mg Intravenous Q6H   Followed by   ON 01/10/2022] LORazepam  0-4 mg Intravenous Q12H   multivitamin with minerals  1 tablet Oral Daily   nicotine  21 mg Transdermal Daily   pantoprazole  40 mg Oral Daily   thiamine  100 mg Oral Daily   Or   thiamine  100 mg Intravenous Daily  Continuous Infusions:  sodium chloride 150 mL/hr at 01/09/22 1136   cefTRIAXone (ROCEPHIN)  IV Stopped (01/08/22 1325)   magnesium sulfate bolus IVPB       Diet Order             Diet full liquid Room service appropriate? Yes; Fluid consistency: Thin  Diet effective now                   Intake/Output Summary (Last 24 hours) at 01/09/2022 1140 Last data filed at 01/09/2022 1034 Gross per 24 hour  Intake 1725 ml  Output --  Net 1725 ml   Net IO Since Admission: 1,725 mL [01/09/22 1140]  Wt Readings from Last 3 Encounters:  01/08/22 95.3 kg  12/26/21 108 kg  12/07/21 109 kg     Unresulted Labs (From admission, onward)     Start     Ordered   01/08/22 0954  Urine Culture  (Urine Culture)  Add-on,   AD       Question:  Indication  Answer:  Flank Pain   01/08/22 0953          Data Reviewed: I have personally reviewed following labs and imaging studies CBC: Recent Labs  Lab 01/08/22 0747 01/09/22 0638  WBC 14.8* 13.9*  NEUTROABS  12.3*  --   HGB 14.9 12.7  HCT 42.1 37.1  MCV 99.8 103.9*  PLT 294 211   Basic Metabolic Panel: Recent Labs  Lab 01/08/22 0747 01/08/22 0952 01/09/22 0638  NA 132*  --  134*  K 3.3*  --  3.5  CL 98  --  100  CO2 23  --  25  GLUCOSE 156*  --  126*  BUN 8  --  8  CREATININE 0.53  --  0.44  CALCIUM 9.1  --  8.1*  MG  --  1.4*  --   PHOS  --  2.8  --    GFR: Estimated Creatinine Clearance: 100.9 mL/min (by C-G formula based on SCr of 0.44 mg/dL). Liver Function Tests: Recent  Labs  Lab 01/08/22 0747  AST 93*  ALT 90*  ALKPHOS 110  BILITOT 1.9*  PROT 8.2*  ALBUMIN 3.9   Recent Labs  Lab 01/08/22 0747 01/09/22 0638  LIPASE 304* 84*   No results for input(s): "AMMONIA" in the last 168 hours. Coagulation Profile: Recent Labs  Lab 01/08/22 1540  INR 1.2   Recent Labs    01/08/22 0747  TRIG 77   Recent Labs  Lab 01/08/22 0747  PROCALCITON <0.10  LATICACIDVEN 1.3    Recent Results (from the past 240 hour(s))  Culture, blood (x 2)     Status: None (Preliminary result)   Collection Time: 01/09/22 12:21 AM   Specimen: BLOOD  Result Value Ref Range Status   Specimen Description BLOOD RIGHT ASSIST CONTROL  Final   Special Requests   Final    BOTTLES DRAWN AEROBIC AND ANAEROBIC Blood Culture results may not be optimal due to an excessive volume of blood received in culture bottles   Culture   Final    NO GROWTH < 12 HOURS Performed at [facility], [address] [place], [place] [address]    Report Status PENDING  Incomplete  Culture, blood (x 2)     Status: None (Preliminary result)   Collection Time: 01/09/22 12:21 AM   Specimen: BLOOD  Result Value Ref Range Status   Specimen Description BLOOD LEFT HAND  Final   Special Requests   Final    BOTTLES DRAWN AEROBIC AND ANAEROBIC Blood Culture adequate volume   Culture   Final    NO GROWTH < 12 HOURS Performed at [facility], [address], [place], [place] [address]    Report  Status PENDING  Incomplete    Antimicrobials: Anti-infectives (From admission, onward)    Start     Dose/Rate Route Frequency Ordered Stop   01/08/22 1100  cefTRIAXone (ROCEPHIN) 2 g in sodium chloride 0.9 % 100 mL IVPB        2 g 200 mL/hr over 30 Minutes Intravenous Every 24 hours 01/08/22 1031        Culture/Microbiology    Component Value Date/Time   SDES BLOOD RIGHT ASSIST CONTROL 01/09/2022 0021   SDES BLOOD LEFT HAND 01/09/2022 0021   SPECREQUEST  01/09/2022 0021    BOTTLES DRAWN AEROBIC AND ANAEROBIC Blood Culture results may not be optimal due to an excessive volume of blood received in culture bottles   SPECREQUEST  01/09/2022 0021    BOTTLES DRAWN AEROBIC AND ANAEROBIC Blood Culture adequate volume   CULT  01/09/2022 0021    NO GROWTH < 12 HOURS Performed at [facility], [address] [place], [place] [address]    [place]  01/09/2022 0021    NO GROWTH < 12 HOURS Performed at [facility], [address] [place], [place] [address]    REPTSTATUS PENDING 01/09/2022 0021   REPTSTATUS PENDING 01/09/2022 0021  Radiology Studies: CT ABDOMEN PELVIS W CONTRAST  Result Date: 01/08/2022 CLINICAL DATA:  Abdominal pain, constipation EXAM: CT ABDOMEN AND PELVIS WITH CONTRAST TECHNIQUE: Multidetector CT imaging of the abdomen and pelvis was performed using the standard protocol following bolus administration of intravenous contrast. RADIATION DOSE REDUCTION: This exam was performed according to the departmental dose-optimization program which includes automated exposure control, adjustment of the mA and/or kV according to patient size and/or use of iterative reconstruction technique. CONTRAST:  127mL OMNIPAQUE IOHEXOL 300 MG/ML  SOLN COMPARISON:  05/11/2021 FINDINGS: Lower chest: Heart is enlarged in  size. There are small linear densities in both lower lung fields suggesting minimal scarring or subsegmental atelectasis. Hepatobiliary: Liver measures 23.3 cm in  length. There is marked fatty infiltration. There is no dilation of bile ducts. Surgical clips are seen in gallbladder fossa. Pancreas: There is stranding in the fat planes adjacent to the head of the pancreas and duodenum. There is mild wall thickening in duodenum. Peripancreatic stranding is extending into the mesentery to the right lower quadrant. There are no loculated fluid collections in or around the pancreas. Spleen: Spleen is not enlarged. Small calcified granulomas are seen in spleen. Adrenals/Urinary Tract: Adrenals are unremarkable. There is no hydronephrosis. Few subcentimeter low-density foci are seen in right renal cortex, possibly cysts. There is 2 cm focus of subtle decreased density in the posterior upper pole of left kidney. There are 2 small calcifications in the lower pole of left kidney each measuring less than 3 mm, possibly nonobstructing renal stones. Urinary bladder is not distended. Stomach/Bowel: Stomach is unremarkable. Small bowel loops are not dilated. Appendix is not dilated. There is no significant wall thickening in colon. There is no pericolic stranding. Vascular/Lymphatic: Scattered arterial calcifications are seen. Reproductive: Mild lobulations are seen in the margin of the uterus. There is 2.5 cm fluid density structure in left adnexa. There is 1.8 cm fluid density structure in the right adnexa. Other: There is no ascites or pneumoperitoneum. Musculoskeletal: Neurostimulator lead is seen in lower thoracic spinal canal. IMPRESSION: There is stranding in the fat planes adjacent to the head of the pancreas and duodenum suggesting acute pancreatitis and possibly duodenitis. Please correlate with laboratory findings. There is no evidence of focal pancreatic necrosis. There is no evidence of intestinal obstruction or pneumoperitoneum. There is no hydronephrosis. There is 2 cm area of subtle decreased density in the posterior upper pole of left kidney which may be a partial volume  averaging artifact or suggest early changes of pyelonephritis. Enlarged fatty liver. Possible small nonobstructing left renal stones. Possible small subcentimeter right renal cysts. Aortic arteriosclerosis. There are low-density structures in both adnexal regions in pelvis, possibly follicles or functional cysts in the ovaries. Other findings as described in the body of the report. Electronically Signed   By: Elmer Picker M.D.   On: 01/08/2022 09:06     LOS: 1 day   Antonieta Pert, MD [facility]  01/09/2022, 11:40 AM

## 2022-01-09 NOTE — TOC Initial Note (Signed)
Transition of Care [facility]) - Initial/Assessment Note    Patient Details  Name: Tammy Decker MRN: [id number] Date of Birth: [birth year]  Transition of Care [facility]) CM/SW Contact:    Beverly Sessions, RN Phone Number: 01/09/2022, 9:42 AM  Clinical Narrative:                  Substance abuse resources added to resources    Transition of Care [facility]) Screening Note   Patient Details  Name: Tammy Decker Date of Birth: 31-Jan-1971   Transition of Care [facility]) CM/SW Contact:    Beverly Sessions, RN Phone Number: 01/09/2022, 9:56 AM    Transition of Care Department [facility]) has reviewed patient and no TOC needs have been identified at this time. We will continue to monitor patient advancement through interdisciplinary progression rounds. If new patient transition needs arise, please place a TOC consult.         Patient Goals and CMS Choice        Expected Discharge Plan and Services                                                Prior Living Arrangements/Services                       Activities of Daily Living Home Assistive Devices/Equipment: None ADL Screening (condition at time of admission) Patient's cognitive ability adequate to safely complete daily activities?: Yes Is the patient deaf or have difficulty hearing?: No Does the patient have difficulty seeing, even when wearing glasses/contacts?: No Does the patient have difficulty concentrating, remembering, or making decisions?: No Patient able to express need for assistance with ADLs?: Yes Does the patient have difficulty dressing or bathing?: No Independently performs ADLs?: Yes (appropriate for developmental age) Does the patient have difficulty walking or climbing stairs?: No Weakness of Legs: None Weakness of Arms/Hands: None  Permission Sought/Granted                  Emotional Assessment              Admission diagnosis:  Generalized abdominal pain  [id number] Alcoholic pancreatitis [id number] Idiopathic acute pancreatitis without infection or necrosis [K85.00] Nausea and vomiting, unspecified vomiting type [R11.2] Patient Active Problem List   Diagnosis Date Noted   Alcoholic pancreatitis [id number]   Tobacco abuse 01/08/2022   Sepsis ([place]) 01/08/2022   Hypokalemia 01/08/2022   Asthma 01/08/2022   Obesity with body mass index (BMI) of 30.0 to 39.9 01/08/2022   GERD (gastroesophageal reflux disease) 01/08/2022   Abnormal LFTs 01/08/2022   Acute pyelonephritis 01/08/2022   SOB (shortness of breath) on exertion 12/26/2021   Acute cough 12/26/2021   History of hiatal hernia 12/07/2021   Heart rate fast 12/07/2021   Anxiety and depression 12/07/2021   Menopause 12/07/2021   Elevated hemoglobin ([place]) 12/07/2021   Hyperglycemia 12/07/2021   H/o Lyme disease 12/07/2021   Nicotine abuse 12/07/2021   Chronic GERD    Alcohol abuse 09/11/2020   Essential hypertension 09/11/2020   Elevated LFTs 09/11/2020   Class 2 severe obesity due to excess calories with serious comorbidity and body mass index (BMI) of 36.0 to 36.9 in adult [facility]) 06/23/2020   Degeneration of lumbar intervertebral disc 09/13/2013   Chronic back pain 09/01/2012   PCP:  Eugenia Pancoast, FNP Pharmacy:  [id number] - [place], [place] - [address] [place] [address] Phone: [phone] Fax: [phone]     Social Determinants of Health (SDOH) Interventions    Readmission Risk Interventions     No data to display

## 2022-01-09 NOTE — Discharge Instructions (Signed)
                  Intensive Outpatient Programs  [facility]    [facility] [address]     [address] [place],  [place]     [place], [place] [phone]      [phone]  [facility]   [facility]  (Inpatient and outpatient)  [phone] (Suboxone and Methadone) [address]           [phone]           [facility]: [facility]    [facility] - Intensive Outpatient [address]     [address] [place], [place] [address]     [place], [place]  [phone]      [phone]  [facility] (Outpatient, Inpatient, [facility]  [facility] (Groups and [facility]) (insurance only) [phone]    [place], [place]          [phone]       [facility]    [facility] (for caregivers and family) [address]     [address] [place], [place]     [place], [place] [phone]      [phone]  Residential Treatment Programs  [facility]  [facility] [place](2 years) Residential: 90 days)  [facility] [address]      [address] [place], [place]     [place], [place] [phone]      [phone] or [phone]  [facility]    [facility] [address]      [address] [place], [place]     [place], [place] [phone]      [phone]  [facility]   [facility] ([facility]) [address]     [address] [place], [place] [address]     [place], [place] [phone]      [phone] Admissions: 8am-3pm M-F  [facility] Program: Residential Program (90 Days)              [facility]:  [facility]  [place], [place]     [place],   [phone] or [phone]    (Walk in Hours over the weekend or by referral)   [facility]: [facility]:[phone] (for crisis  response 24 hours a day) 

## 2022-01-10 ENCOUNTER — Encounter: Payer: Self-pay | Admitting: Family

## 2022-01-10 ENCOUNTER — Inpatient Hospital Stay: Payer: PRIVATE HEALTH INSURANCE

## 2022-01-10 DIAGNOSIS — K852 Alcohol induced acute pancreatitis without necrosis or infection: Secondary | ICD-10-CM | POA: Diagnosis not present

## 2022-01-10 DIAGNOSIS — G479 Sleep disorder, unspecified: Secondary | ICD-10-CM

## 2022-01-10 DIAGNOSIS — E876 Hypokalemia: Secondary | ICD-10-CM

## 2022-01-10 LAB — CBC
HCT: 34.5 % — ABNORMAL LOW (ref 36.0–46.0)
Hemoglobin: 11.8 g/dL — ABNORMAL LOW (ref 12.0–15.0)
MCH: 35.5 pg — ABNORMAL HIGH (ref 26.0–34.0)
MCHC: 34.2 g/dL (ref 30.0–36.0)
MCV: 103.9 fL — ABNORMAL HIGH (ref 80.0–100.0)
Platelets: 211 10*3/uL (ref 150–400)
RBC: 3.32 MIL/uL — ABNORMAL LOW (ref 3.87–5.11)
RDW: 13.7 % (ref 11.5–15.5)
WBC: 12.3 10*3/uL — ABNORMAL HIGH (ref 4.0–10.5)
nRBC: 0 % (ref 0.0–0.2)

## 2022-01-10 LAB — COMPREHENSIVE METABOLIC PANEL
ALT: 87 U/L — ABNORMAL HIGH (ref 0–44)
AST: 89 U/L — ABNORMAL HIGH (ref 15–41)
Albumin: 2.9 g/dL — ABNORMAL LOW (ref 3.5–5.0)
Alkaline Phosphatase: 108 U/L (ref 38–126)
Anion gap: 10 (ref 5–15)
BUN: 7 mg/dL (ref 6–20)
CO2: 22 mmol/L (ref 22–32)
Calcium: 8.1 mg/dL — ABNORMAL LOW (ref 8.9–10.3)
Chloride: 101 mmol/L (ref 98–111)
Creatinine, Ser: 0.45 mg/dL (ref 0.44–1.00)
GFR, Estimated: >60 mL/min (ref >60)
Glucose, Bld: 111 mg/dL — ABNORMAL HIGH (ref 70–99)
Potassium: 3.2 mmol/L — ABNORMAL LOW (ref 3.5–5.1)
Sodium: 133 mmol/L — ABNORMAL LOW (ref 135–145)
Total Bilirubin: 1.9 mg/dL — ABNORMAL HIGH (ref 0.3–1.2)
Total Protein: 6.7 g/dL (ref 6.5–8.1)

## 2022-01-10 LAB — URINE CULTURE: Culture: >=100000 — AB

## 2022-01-10 LAB — GLUCOSE, CAPILLARY: Glucose-Capillary: 105 mg/dL — ABNORMAL HIGH (ref 70–99)

## 2022-01-10 LAB — MAGNESIUM: Magnesium: 1.9 mg/dL (ref 1.7–2.4)

## 2022-01-10 MED ORDER — BISACODYL 10 MG RE SUPP: 10.0000 mg | Freq: Every day | RECTAL | Status: DC | PRN

## 2022-01-10 MED ORDER — POTASSIUM CHLORIDE CRYS ER 20 MEQ PO TBCR
40.0000 meq | EXTENDED_RELEASE_TABLET | Freq: Once | ORAL | Status: AC
  Administered 2022-01-10: 40 meq via ORAL
  Filled 2022-01-10: qty 2

## 2022-01-10 NOTE — Plan of Care (Signed)

## 2022-01-10 NOTE — Plan of Care (Signed)
  Problem: Education: Goal: Knowledge of General Education information will improve Description: Including pain rating scale, medication(s)/side effects and non-pharmacologic comfort measures Outcome: Progressing   Problem: Clinical Measurements: Goal: Will remain free from infection 01/10/2022 0107 by Myles Gip, RN Outcome: Progressing 01/10/2022 0107 by Myles Gip, RN Outcome: Progressing   Problem: Clinical Measurements: Goal: Respiratory complications will improve Outcome: Progressing   Problem: Clinical Measurements: Goal: Cardiovascular complication will be avoided Outcome: Progressing   Problem: Nutrition: Goal: Adequate nutrition will be maintained Outcome: Progressing   Problem: Pain Managment: Goal: General experience of comfort will improve Outcome: Progressing

## 2022-01-10 NOTE — Progress Notes (Addendum)
PROGRESS NOTE Tammy Decker  ZOX:[id number] DOB: October 01, 1970 DOA: 01/08/2022 PCP: Mort Sawyers, FNP   Brief Narrative/Hospital Course: 67F, hx of alcohol abuse, tobacco abuse, hypertension, asthma, GERD, depression with anxiety, obesity obesity BMI 32.89, presented with abdominal pain of 2 days. In the ED had CT scan abdomen pelvis-see report for detail-findings suggestive of acute pancreatitis possibly duodenitis no intestinal obstruction, 2 cm of subtle decreased density in the posterior upper pole of the left kidney-which could be artifact or early changes of pyelonephritis.  Enlarged fatty liver Labs with hyponatremia hyperkalemia transaminitis lipase 304 normal lactic acid procalcitonin, TG, mild leukocytosis Patient was admitted   Subjective: Seen and examined this morning Complains of constipation over 6 days now to [facility] and see how she does before she tries Dulcolax PR Still has some pain on the right flank coming to the anterior abdomen tolerating full liquid diet although some nausea no vomiting Overnight patient has been afebrile Heart rate in low 100 BP 110/80s, on 3 l nasal cannula Labs with low potassium, mag improved WBC trending down Urine culture growing more than 100,000 Citrobacter koseri C/S pending  Assessment and Plan: Principal Problem:   Alcoholic pancreatitis Active Problems:   Sepsis (HCC)   Acute pyelonephritis   Essential hypertension   Hypokalemia   Asthma   GERD (gastroesophageal reflux disease)   Abnormal LFTs   Alcohol abuse   Tobacco abuse   Obesity with body mass index (BMI) of 30.0 to 39.9   Acute alcohol induced pancreatitis Transaminitis: Clinically improving, tolerating full liquid diet although nausea, constipation . Cont with IV fluids antiemetics pain control.  CT scan reviewed.  Lipase improving TG normal.  Discussed alcohol cessation.  Once no longer nauseous advance to soft diet, trend LFTs. Recent Labs  Lab 01/08/22 0747  01/08/22 1540 01/10/22 0527  AST 93*  --  89*  ALT 90*  --  87*  ALKPHOS 110  --  108  BILITOT 1.9*  --  1.9*  PROT 8.2*  --  6.7  ALBUMIN 3.9  --  2.9*  INR  --  1.2  --      Sepsis POA due to to pyelonephritis  Pyelonephritis/UTI due to Citrobacter : urine culture growing more than 100,000 Citrobacter koseri C/S pending.WBC count improving.  Hemodynamically stable.  Cont ceftriaxone. Recent Labs  Lab 01/08/22 0747 01/09/22 0638 01/10/22 0527  WBC 14.8* 13.9* 12.3*  LATICACIDVEN 1.3  --   --   PROCALCITON <0.10  --   --    Hypomagnesemia repleted.   Hypokalemia replete and clear Hyponatremia due to #1 continue IV fluids Mild anemia monitor Alcohol abuse at risk of withdrawal: No signs of withdrawal.  Continue thiamine, PPI, folate CIWA Ativan watch for withdrawals. Tobacco abuse: Cessation advised Hypertension: BP well controlled.Home losartan on hold Constipation continue MiraLAX daily add prnDulcolax suppository Asthma not in exacerbation GERD: Continue PPI Class I Obesity:Patient's Body mass index is 32.89 kg/m. : Will benefit with PCP follow-up, weight loss  healthy lifestyle and outpatient sleep evaluation.  DVT prophylaxis: On Lovenox Code Status:   Code Status: Full Code Family Communication: plan of care discussed with patient at bedside. Patient status is: Inpatient because of pyelonephritis and pancreatitis Level of care: Telemetry Medical  Dispo: The patient is from: Home            Anticipated disposition: Home in 2 to 3 days  Objective: Vitals last 24 hrs: Vitals:   01/09/22 1947 01/10/22 0000 01/10/22 0453 01/10/22 8119  BP: (!) 140/90  120/85 113/86  Pulse: (!) 120 (!) 110 (!) 109 (!) 101  Resp: 20 16 20 20   Temp: 98.3 F (36.8 C)  97.8 F (36.6 C) 97.8 F (36.6 C)  TempSrc: Oral  Oral Oral  SpO2: 100%  91% 97%  Weight:      Height:       Weight change:   Physical Examination: General exam: AAox3, weak,older appearing HEENT:Oral mucosa  moist, Ear/Nose WNL grossly, dentition normal. Respiratory system: bilaterally clear BS, no use of accessory muscle Cardiovascular system: S1 & S2 +, regular rate. Gastrointestinal system: Abdomen soft, full/distended abdomen mild tenderness  rt flank,BS+ Nervous System:Alert, awake, moving extremities and grossly nonfocal Extremities: LE ankle edema neg, lower extremities warm Skin: No rashes,no icterus. MSK: Normal muscle bulk,tone, power   Medications reviewed:  Scheduled Meds:  cyanocobalamin  1,000 mcg Oral Daily   enoxaparin (LOVENOX) injection  0.5 mg/kg Subcutaneous Q24H   folic acid  1 mg Oral Daily   LORazepam  0-4 mg Intravenous Q6H   Followed by   LORazepam  0-4 mg Intravenous Q12H   multivitamin with minerals  1 tablet Oral Daily   nicotine  21 mg Transdermal Daily   pantoprazole  40 mg Oral Daily   potassium chloride  40 mEq Oral Once   thiamine  100 mg Oral Daily   Or   thiamine  100 mg Intravenous Daily  Continuous Infusions:  sodium chloride 150 mL/hr at 01/10/22 0600   cefTRIAXone (ROCEPHIN)  IV 2 g (01/09/22 1150)     Diet Order             Diet full liquid Room service appropriate? Yes; Fluid consistency: Thin  Diet effective now                   Intake/Output Summary (Last 24 hours) at 01/10/2022 0741 Last data filed at 01/10/2022 0600 Gross per 24 hour  Intake 3546.76 ml  Output --  Net 3546.76 ml    Net IO Since Admission: 5,031.76 mL [01/10/22 0741]  Wt Readings from Last 3 Encounters:  01/08/22 95.3 kg  12/26/21 108 kg  12/07/21 109 kg     Unresulted Labs (From admission, onward)     Start     Ordered   01/10/22 0500  CBC  Daily at 5am,   R      01/09/22 1145   01/10/22 0500  Comprehensive metabolic panel  Daily at 5am,   R      01/09/22 1145          Data Reviewed: I have personally reviewed following labs and imaging studies CBC: Recent Labs  Lab 01/08/22 0747 01/09/22 0638 01/10/22 0527  WBC 14.8* 13.9* 12.3*   NEUTROABS 12.3*  --   --   HGB 14.9 12.7 11.8*  HCT 42.1 37.1 34.5*  MCV 99.8 103.9* 103.9*  PLT 294 211 211    Basic Metabolic Panel: Recent Labs  Lab 01/08/22 0747 01/08/22 0952 01/09/22 0638 01/10/22 0527  NA 132*  --  134* 133*  K 3.3*  --  3.5 3.2*  CL 98  --  100 101  CO2 23  --  25 22  GLUCOSE 156*  --  126* 111*  BUN 8  --  8 7  CREATININE 0.53  --  0.44 0.45  CALCIUM 9.1  --  8.1* 8.1*  MG  --  1.4*  --  1.9  PHOS  --  2.8  --   --  GFR: Estimated Creatinine Clearance: 100.9 mL/min (by C-G formula based on SCr of 0.45 mg/dL). Liver Function Tests: Recent Labs  Lab 01/08/22 0747 01/10/22 0527  AST 93* 89*  ALT 90* 87*  ALKPHOS 110 108  BILITOT 1.9* 1.9*  PROT 8.2* 6.7  ALBUMIN 3.9 2.9*    Recent Labs  Lab 01/08/22 0747 01/09/22 0638  LIPASE 304* 84*    No results for input(s): "AMMONIA" in the last 168 hours. Coagulation Profile: Recent Labs  Lab 01/08/22 1540  INR 1.2    Recent Labs    01/08/22 0747  TRIG 77    Recent Labs  Lab 01/08/22 0747  PROCALCITON <0.10  LATICACIDVEN 1.3     Recent Results (from the past 240 hour(s))  Urine Culture     Status: Abnormal (Preliminary result)   Collection Time: 01/08/22  7:47 AM   Specimen: Urine, Clean Catch  Result Value Ref Range Status   Specimen Description   Final    URINE, CLEAN CATCH Performed at [facility], [address] [place], [place] [address]    Special Requests   Final    NONE Performed at [facility], [address], [place], [place] [address]    Culture (A)  Final    >=100,000 COLONIES/mL CITROBACTER KOSERI SUSCEPTIBILITIES TO FOLLOW Performed at [facility], [address] [place], [place] [address]    Report Status PENDING  Incomplete  Culture, blood (x 2)     Status: None (Preliminary result)   Collection Time: 01/09/22 12:21 AM   Specimen: BLOOD  Result Value Ref Range Status   Specimen Description BLOOD RIGHT ASSIST  CONTROL  Final   Special Requests   Final    BOTTLES DRAWN AEROBIC AND ANAEROBIC Blood Culture results may not be optimal due to an excessive volume of blood received in culture bottles   Culture   Final    NO GROWTH 1 DAY Performed at [facility], [address], [place], [place] [address]    Report Status PENDING  Incomplete  Culture, blood (x 2)     Status: None (Preliminary result)   Collection Time: 01/09/22 12:21 AM   Specimen: BLOOD  Result Value Ref Range Status   Specimen Description BLOOD LEFT HAND  Final   Special Requests   Final    BOTTLES DRAWN AEROBIC AND ANAEROBIC Blood Culture adequate volume   Culture   Final    NO GROWTH 1 DAY Performed at [facility], [address] [place], [place] [address]    Report Status PENDING  Incomplete    Antimicrobials: Anti-infectives (From admission, onward)    Start     Dose/Rate Route Frequency Ordered Stop   01/08/22 1100  cefTRIAXone (ROCEPHIN) 2 g in sodium chloride 0.9 % 100 mL IVPB        2 g 200 mL/hr over 30 Minutes Intravenous Every 24 hours 01/08/22 1031        Culture/Microbiology    Component Value Date/Time   SDES BLOOD RIGHT ASSIST CONTROL 01/09/2022 0021   SDES BLOOD LEFT HAND 01/09/2022 0021   SPECREQUEST  01/09/2022 0021    BOTTLES DRAWN AEROBIC AND ANAEROBIC Blood Culture results may not be optimal due to an excessive volume of blood received in culture bottles   SPECREQUEST  01/09/2022 0021    BOTTLES DRAWN AEROBIC AND ANAEROBIC Blood Culture adequate volume   CULT  01/09/2022 0021    NO GROWTH 1 DAY Performed at [facility]  Lab, [address] [place], [place] [address]    CULT  01/09/2022 0021    NO GROWTH 1 DAY Performed at [facility], [address] [place] [place], [place] [address]    REPTSTATUS PENDING 01/09/2022 0021   REPTSTATUS PENDING 01/09/2022 0021  Radiology Studies: CT ABDOMEN PELVIS W CONTRAST  Result Date: 01/08/2022 CLINICAL DATA:  Abdominal  pain, constipation EXAM: CT ABDOMEN AND PELVIS WITH CONTRAST TECHNIQUE: Multidetector CT imaging of the abdomen and pelvis was performed using the standard protocol following bolus administration of intravenous contrast. RADIATION DOSE REDUCTION: This exam was performed according to the departmental dose-optimization program which includes automated exposure control, adjustment of the mA and/or kV according to patient size and/or use of iterative reconstruction technique. CONTRAST:  OMNIPAQUE IOHEXOL 300 MG/ML  SOLN COMPARISON:  05/11/2021 FINDINGS: Lower chest: Heart is enlarged in size. There are small linear densities in both lower lung fields suggesting minimal scarring or subsegmental atelectasis. Hepatobiliary: Liver measures 23.3 cm in length. There is marked fatty infiltration. There is no dilation of bile ducts. Surgical clips are seen in gallbladder fossa. Pancreas: There is stranding in the fat planes adjacent to the head of the pancreas and duodenum. There is mild wall thickening in duodenum. Peripancreatic stranding is extending into the mesentery to the right lower quadrant. There are no loculated fluid collections in or around the pancreas. Spleen: Spleen is not enlarged. Small calcified granulomas are seen in spleen. Adrenals/Urinary Tract: Adrenals are unremarkable. There is no hydronephrosis. Few subcentimeter low-density foci are seen in right renal cortex, possibly cysts. There is 2 cm focus of subtle decreased density in the posterior upper pole of left kidney. There are 2 small calcifications in the lower pole of left kidney each measuring less than 3 mm, possibly nonobstructing renal stones. Urinary bladder is not distended. Stomach/Bowel: Stomach is unremarkable. Small bowel loops are not dilated. Appendix is not dilated. There is no significant wall thickening in colon. There is no pericolic stranding. Vascular/Lymphatic: Scattered arterial calcifications are seen. Reproductive: Mild  lobulations are seen in the margin of the uterus. There is 2.5 cm fluid density structure in left adnexa. There is 1.8 cm fluid density structure in the right adnexa. Other: There is no ascites or pneumoperitoneum. Musculoskeletal: Neurostimulator lead is seen in lower thoracic spinal canal. IMPRESSION: There is stranding in the fat planes adjacent to the head of the pancreas and duodenum suggesting acute pancreatitis and possibly duodenitis. Please correlate with laboratory findings. There is no evidence of focal pancreatic necrosis. There is no evidence of intestinal obstruction or pneumoperitoneum. There is no hydronephrosis. There is 2 cm area of subtle decreased density in the posterior upper pole of left kidney which may be a partial volume averaging artifact or suggest early changes of pyelonephritis. Enlarged fatty liver. Possible small nonobstructing left renal stones. Possible small subcentimeter right renal cysts. Aortic arteriosclerosis. There are low-density structures in both adnexal regions in pelvis, possibly follicles or functional cysts in the ovaries. Other findings as described in the body of the report. Electronically Signed   By: Ernie Avena M.D.   On: 01/08/2022 09:06     LOS: 2 days   Lanae Boast, MD [facility]  01/10/2022, 7:41 AM

## 2022-01-10 NOTE — Progress Notes (Signed)
   01/10/22 2043  Assess: MEWS Score  Temp 98.1 F (36.7 C)  BP (!) 138/96  MAP (mmHg) 110  Pulse Rate (!) 120  Resp 18  SpO2 96 %  O2 Device Room Air  Assess: MEWS Score  MEWS Temp 0  MEWS Systolic 0  MEWS Pulse 2  MEWS RR 0  MEWS LOC 0  MEWS Score 2  MEWS Score Color Yellow  Assess: if the MEWS score is Yellow or Red  Were vital signs taken at a resting state? Yes  Focused Assessment No change from prior assessment  Does the patient meet 2 or more of the SIRS criteria? No  MEWS guidelines implemented *See Row Information* No, previously yellow, continue vital signs every 4 hours  Treat  MEWS Interventions Administered scheduled meds/treatments  Pain Scale 0-10  Pain Score 8  Pain Type Acute pain  Pain Location Abdomen  Pain Orientation Right  Pain Descriptors / Indicators Aching;Constant  Pain Frequency Constant  Pain Onset On-going  Pain Intervention(s) Medication (See eMAR)  Patients response to intervention Relief  Take Vital Signs  Increase Vital Sign Frequency  Yellow: Q 2hr X 2 then Q 4hr X 2, if remains yellow, continue Q 4hrs  Notify: Charge Nurse/RN  Name of Charge Nurse/RN Notified Dawn, RN  Date Charge Nurse/RN Notified 01/10/22  Time Charge Nurse/RN Notified 2045  Document  Patient Outcome Stabilized after interventions  Progress note created (see row info) Yes  Assess: SIRS CRITERIA  SIRS Temperature  0  SIRS Pulse 1  SIRS Respirations  0  SIRS WBC 1  SIRS Score Sum  2

## 2022-01-11 DIAGNOSIS — K852 Alcohol induced acute pancreatitis without necrosis or infection: Secondary | ICD-10-CM | POA: Diagnosis not present

## 2022-01-11 LAB — COMPREHENSIVE METABOLIC PANEL
ALT: 86 U/L — ABNORMAL HIGH (ref 0–44)
AST: 85 U/L — ABNORMAL HIGH (ref 15–41)
Albumin: 2.9 g/dL — ABNORMAL LOW (ref 3.5–5.0)
Alkaline Phosphatase: 118 U/L (ref 38–126)
Anion gap: 5 (ref 5–15)
BUN: 6 mg/dL (ref 6–20)
CO2: 24 mmol/L (ref 22–32)
Calcium: 8.3 mg/dL — ABNORMAL LOW (ref 8.9–10.3)
Chloride: 107 mmol/L (ref 98–111)
Creatinine, Ser: 0.41 mg/dL — ABNORMAL LOW (ref 0.44–1.00)
GFR, Estimated: >60 mL/min (ref >60)
Glucose, Bld: 167 mg/dL — ABNORMAL HIGH (ref 70–99)
Potassium: 3.5 mmol/L (ref 3.5–5.1)
Sodium: 136 mmol/L (ref 135–145)
Total Bilirubin: 1.3 mg/dL — ABNORMAL HIGH (ref 0.3–1.2)
Total Protein: 6.8 g/dL (ref 6.5–8.1)

## 2022-01-11 LAB — CBC
HCT: 33.8 % — ABNORMAL LOW (ref 36.0–46.0)
Hemoglobin: 11.6 g/dL — ABNORMAL LOW (ref 12.0–15.0)
MCH: 36.1 pg — ABNORMAL HIGH (ref 26.0–34.0)
MCHC: 34.3 g/dL (ref 30.0–36.0)
MCV: 105.3 fL — ABNORMAL HIGH (ref 80.0–100.0)
Platelets: 221 10*3/uL (ref 150–400)
RBC: 3.21 MIL/uL — ABNORMAL LOW (ref 3.87–5.11)
RDW: 13.8 % (ref 11.5–15.5)
WBC: 10 10*3/uL (ref 4.0–10.5)
nRBC: 0 % (ref 0.0–0.2)

## 2022-01-11 LAB — GLUCOSE, CAPILLARY
Glucose-Capillary: 131 mg/dL — ABNORMAL HIGH (ref 70–99)
Glucose-Capillary: 136 mg/dL — ABNORMAL HIGH (ref 70–99)

## 2022-01-11 MED ORDER — LORAZEPAM 2 MG/ML IJ SOLN: 0.0000 mg | Freq: Two times a day (BID) | INTRAMUSCULAR | Status: DC

## 2022-01-11 MED ORDER — LORAZEPAM 2 MG/ML IJ SOLN
0.0000 mg | Freq: Two times a day (BID) | INTRAMUSCULAR | Status: AC
  Administered 2022-01-11: 1 mg via INTRAVENOUS
  Filled 2022-01-11: qty 1

## 2022-01-11 MED ORDER — LORAZEPAM 2 MG/ML IJ SOLN: 0.0000 mg | Freq: Four times a day (QID) | INTRAMUSCULAR | Status: DC

## 2022-01-11 MED ORDER — METOCLOPRAMIDE HCL 5 MG/ML IJ SOLN
5.0000 mg | Freq: Four times a day (QID) | INTRAMUSCULAR | Status: DC | PRN
  Administered 2022-01-11: 5 mg via INTRAVENOUS
  Filled 2022-01-11: qty 2

## 2022-01-11 MED ORDER — BISACODYL 10 MG RE SUPP
10.0000 mg | Freq: Once | RECTAL | Status: AC
  Administered 2022-01-11: 10 mg via RECTAL
  Filled 2022-01-11 (×2): qty 1

## 2022-01-11 NOTE — Plan of Care (Signed)

## 2022-01-11 NOTE — Progress Notes (Signed)
PROGRESS NOTE Tammy Decker  EPP:[id number] DOB: May 13, 1970 DOA: 01/08/2022 PCP: Mort Sawyers, FNP   Brief Narrative/Hospital Course: 17F, hx of alcohol abuse, tobacco abuse, hypertension, asthma, GERD, depression with anxiety, obesity obesity BMI 32.89, presented with abdominal pain of 2 days. In the ED had CT scan abdomen pelvis-see report for detail-findings suggestive of acute pancreatitis possibly duodenitis no intestinal obstruction, 2 cm of subtle decreased density in the posterior upper pole of the left kidney-which could be artifact or early changes of pyelonephritis.  Enlarged fatty liver Labs with hyponatremia hyperkalemia transaminitis lipase 304 normal lactic acid procalcitonin, TG, mild leukocytosis Patient was admitted Being managed conservatively with IV fluid hydration diet is being advanced slowly.  Urine culture with Citrobacter  Subjective:  Seen and examined.  Still complains of constipation for at least 6 to 7 days, passing gas, feels slightly bloated, x-ray abdomen no acute finding yesterday.   Tolerating full liquid diet.   Still having right flank pain coming to the epigastrium   Assessment and Plan: Principal Problem:   Alcoholic pancreatitis Active Problems:   Sepsis (HCC)   Acute pyelonephritis   Essential hypertension   Hypokalemia   Asthma   GERD (gastroesophageal reflux disease)   Abnormal LFTs   Alcohol abuse   Tobacco abuse   Obesity with body mass index (BMI) of 30.0 to 39.9   Acute alcohol induced pancreatitis Transaminitis: Overall clinically improving tolerating full liquid diet once having bowel movement or no nausea vomiting changed to soft diet today .Cont with IV fluids, prn pain control and antiemetics. CT scan reviewed.  Lipase improved TG normal.  Discussed alcohol cessation.Trend LFTs. Recent Labs  Lab 01/08/22 0747 01/08/22 1540 01/10/22 0527 01/11/22 0415  AST 93*  --  89* 85*  ALT 90*  --  87* 86*  ALKPHOS 110  --  108  118  BILITOT 1.9*  --  1.9* 1.3*  PROT 8.2*  --  6.7 6.8  ALBUMIN 3.9  --  2.9* 2.9*  INR  --  1.2  --   --      Sepsis POA due to to pyelonephritis  Pyelonephritis/UTI due to Citrobacter : urine culture pansensitive Citrobacter koseri.  On ceftriaxone for now.  Leukocytosis resolved overall improving still having right flank pain Recent Labs  Lab 01/08/22 0747 01/09/22 0638 01/10/22 0527 01/11/22 0415  WBC 14.8* 13.9* 12.3* 10.0  LATICACIDVEN 1.3  --   --   --   PROCALCITON <0.10  --   --   --     Hypomagnesemia/Hypokalemia repleted and resolved.  Hyponatremia due to #1 continue IV fluids  Mild anemia monitor  Alcohol abuse at risk of withdrawal: Cessation advised, current no signs of withdrawal, continue thiamine, PPI, folate CIWA Ativan watch for withdrawals. Tobacco abuse: Cessation advised Hypertension: BP well controlled.Home losartan remains on hold  Constipation continue MiraLAX daily, add prnDulcolax x1 this am  Asthma not in exacerbation GERD: Continue PPI Class I Obesity:Patient's Body mass index is 32.89 kg/m. : Will benefit with PCP follow-up, weight loss  healthy lifestyle and outpatient sleep evaluation.  DVT prophylaxis: On Lovenox Code Status:   Code Status: Full Code Family Communication: plan of care discussed with patient at bedside. Patient status is: Inpatient because of pyelonephritis and pancreatitis Level of care: Telemetry Medical  Dispo: The patient is from: Home            Anticipated disposition: Home 1-2 days  Objective: Vitals last 24 hrs: Vitals:   01/11/22 0212 01/11/22  4944 01/11/22 0641 01/11/22 1015  BP: 126/81  117/74 121/80  Pulse: 99 (!) 110 (!) 107 97  Resp: 16  16 16   Temp: 98 F (36.7 C)  98.5 F (36.9 C) 98.2 F (36.8 C)  TempSrc:   Oral Oral  SpO2: 97%  96% 90%  Weight:      Height:       Weight change:   Physical Examination: General exam: AAOX3, weak,older appearing HEENT:Oral mucosa moist, Ear/Nose WNL  grossly, dentition normal. Respiratory system: bilaterally CLEAR BS, no use of accessory muscle Cardiovascular system: S1 & S2 +, regular rate. Gastrointestinal system: Abdomen soft,/obese and full,NT,BS+ Nervous System:Alert, awake, moving extremities and grossly nonfocal Extremities: LE ankle edema neg, lower extremities warm Skin: No rashes,no icterus. MSK: Normal muscle bulk,tone, power   Medications reviewed:  Scheduled Meds:  bisacodyl  10 mg Rectal Once   cyanocobalamin  1,000 mcg Oral Daily   enoxaparin (LOVENOX) injection  0.5 mg/kg Subcutaneous Q24H   folic acid  1 mg Oral Daily   LORazepam  0-4 mg Intravenous Q12H   multivitamin with minerals  1 tablet Oral Daily   nicotine  21 mg Transdermal Daily   pantoprazole  40 mg Oral Daily   thiamine  100 mg Oral Daily   Or   thiamine  100 mg Intravenous Daily  Continuous Infusions:  sodium chloride 125 mL/hr at 01/11/22 0141   cefTRIAXone (ROCEPHIN)  IV 2 g (01/11/22 0932)     Diet Order             Diet full liquid Room service appropriate? Yes; Fluid consistency: Thin  Diet effective now                   Intake/Output Summary (Last 24 hours) at 01/11/2022 1035 Last data filed at 01/11/2022 1028 Gross per 24 hour  Intake 1755.8 ml  Output --  Net 1755.8 ml    Net IO Since Admission: 6,787.56 mL [01/11/22 1035]  Wt Readings from Last 3 Encounters:  01/08/22 95.3 kg  12/26/21 108 kg  12/07/21 109 kg     Unresulted Labs (From admission, onward)     Start     Ordered   01/10/22 0500  CBC  Daily at 5am,   R      01/09/22 1145   01/10/22 0500  Comprehensive metabolic panel  Daily at 5am,   R      01/09/22 1145          Data Reviewed: I have personally reviewed following labs and imaging studies CBC: Recent Labs  Lab 01/08/22 0747 01/09/22 0638 01/10/22 0527 01/11/22 0415  WBC 14.8* 13.9* 12.3* 10.0  NEUTROABS 12.3*  --   --   --   HGB 14.9 12.7 11.8* 11.6*  HCT 42.1 37.1 34.5* 33.8*  MCV  99.8 103.9* 103.9* 105.3*  PLT 294 211 211 221    Basic Metabolic Panel: Recent Labs  Lab 01/08/22 0747 01/08/22 0952 01/09/22 0638 01/10/22 0527 01/11/22 0415  NA 132*  --  134* 133* 136  K 3.3*  --  3.5 3.2* 3.5  CL 98  --  100 101 107  CO2 23  --  25 22 24   GLUCOSE 156*  --  126* 111* 167*  BUN 8  --  8 7 6   CREATININE 0.53  --  0.44 0.45 0.41*  CALCIUM 9.1  --  8.1* 8.1* 8.3*  MG  --  1.4*  --  1.9  --  PHOS  --  2.8  --   --   --     GFR: Estimated Creatinine Clearance: 100.9 mL/min (A) (by C-G formula based on SCr of 0.41 mg/dL (L)). Liver Function Tests: Recent Labs  Lab 01/08/22 0747 01/10/22 0527 01/11/22 0415  AST 93* 89* 85*  ALT 90* 87* 86*  ALKPHOS 110 108 118  BILITOT 1.9* 1.9* 1.3*  PROT 8.2* 6.7 6.8  ALBUMIN 3.9 2.9* 2.9*    Recent Labs  Lab 01/08/22 0747 01/09/22 0638  LIPASE 304* 84*    No results for input(s): "AMMONIA" in the last 168 hours. Coagulation Profile: Recent Labs  Lab 01/08/22 1540  INR 1.2    No results for input(s): "CHOL", "HDL", "LDLCALC", "TRIG", "CHOLHDL", "LDLDIRECT" in the last 72 hours.  Recent Labs  Lab 01/08/22 0747  PROCALCITON <0.10  LATICACIDVEN 1.3     Recent Results (from the past 240 hour(s))  Urine Culture     Status: Abnormal   Collection Time: 01/08/22  7:47 AM   Specimen: Urine, Clean Catch  Result Value Ref Range Status   Specimen Description   Final    URINE, CLEAN CATCH Performed at [facility], [address] [place], [place] [address]    Special Requests   Final    NONE Performed at [facility], [address] [place], [place] [address]    Culture >=100,000 COLONIES/mL CITROBACTER KOSERI (A)  Final   Report Status 01/10/2022 FINAL  Final   Organism ID, Bacteria CITROBACTER KOSERI (A)  Final      Susceptibility   Citrobacter koseri - MIC*    CEFAZOLIN <=4 SENSITIVE Sensitive     CEFEPIME <=0.12 SENSITIVE Sensitive     CEFTRIAXONE <=0.25 SENSITIVE  Sensitive     CIPROFLOXACIN <=0.25 SENSITIVE Sensitive     GENTAMICIN <=1 SENSITIVE Sensitive     IMIPENEM <=0.25 SENSITIVE Sensitive     NITROFURANTOIN <=16 SENSITIVE Sensitive     TRIMETH/SULFA <=20 SENSITIVE Sensitive     PIP/TAZO <=4 SENSITIVE Sensitive     * >=100,000 COLONIES/mL CITROBACTER KOSERI  Culture, blood (x 2)     Status: None (Preliminary result)   Collection Time: 01/09/22 12:21 AM   Specimen: BLOOD  Result Value Ref Range Status   Specimen Description BLOOD RIGHT ASSIST CONTROL  Final   Special Requests   Final    BOTTLES DRAWN AEROBIC AND ANAEROBIC Blood Culture results may not be optimal due to an excessive volume of blood received in culture bottles   Culture   Final    NO GROWTH 2 DAYS Performed at [facility], [address], [place], [place] [address]    Report Status PENDING  Incomplete  Culture, blood (x 2)     Status: None (Preliminary result)   Collection Time: 01/09/22 12:21 AM   Specimen: BLOOD  Result Value Ref Range Status   Specimen Description BLOOD LEFT HAND  Final   Special Requests   Final    BOTTLES DRAWN AEROBIC AND ANAEROBIC Blood Culture adequate volume   Culture   Final    NO GROWTH 2 DAYS Performed at [facility], [address] [place], [place] [address]    Report Status PENDING  Incomplete    Antimicrobials: Anti-infectives (From admission, onward)    Start     Dose/Rate Route Frequency Ordered Stop   01/08/22 1100  cefTRIAXone (ROCEPHIN) 2 g in sodium chloride 0.9 % 100 mL IVPB  2 g 200 mL/hr over 30 Minutes Intravenous Every 24 hours 01/08/22 1031        Culture/Microbiology    Component Value Date/Time   SDES BLOOD RIGHT ASSIST CONTROL 01/09/2022 0021   SDES BLOOD LEFT HAND 01/09/2022 0021   SPECREQUEST  01/09/2022 0021    BOTTLES DRAWN AEROBIC AND ANAEROBIC Blood Culture results may not be optimal due to an excessive volume of blood received in culture bottles   SPECREQUEST  01/09/2022 0021     BOTTLES DRAWN AEROBIC AND ANAEROBIC Blood Culture adequate volume   CULT  01/09/2022 0021    NO GROWTH 2 DAYS Performed at [facility], [address] [place] [place], [place] [address]    CULT  01/09/2022 0021    NO GROWTH 2 DAYS Performed at [facility], [address] [place], [place] [address]    REPTSTATUS PENDING 01/09/2022 0021   REPTSTATUS PENDING 01/09/2022 0021  Radiology Studies: DG Abd Portable 1V  Result Date: 01/10/2022 CLINICAL DATA:  Abdominal pain. EXAM: PORTABLE ABDOMEN - 1 VIEW COMPARISON:  CT abdomen pelvis dated January 08, 2022. FINDINGS: The bowel gas pattern is normal. No radio-opaque calculi or other significant radiographic abnormality are seen. Prior cholecystectomy. Spinal cord stimulator again noted. IMPRESSION: 1. Negative. Electronically Signed   By: Obie Dredge M.D.   On: 01/10/2022 14:49     LOS: 3 days   Lanae Boast, MD [facility]  01/11/2022, 10:35 AM

## 2022-01-12 ENCOUNTER — Inpatient Hospital Stay: Payer: PRIVATE HEALTH INSURANCE

## 2022-01-12 LAB — CBC
HCT: 34.5 % — ABNORMAL LOW (ref 36.0–46.0)
Hemoglobin: 11.6 g/dL — ABNORMAL LOW (ref 12.0–15.0)
MCH: 35.6 pg — ABNORMAL HIGH (ref 26.0–34.0)
MCHC: 33.6 g/dL (ref 30.0–36.0)
MCV: 105.8 fL — ABNORMAL HIGH (ref 80.0–100.0)
Platelets: 245 10*3/uL (ref 150–400)
RBC: 3.26 MIL/uL — ABNORMAL LOW (ref 3.87–5.11)
RDW: 13.5 % (ref 11.5–15.5)
WBC: 8.8 10*3/uL (ref 4.0–10.5)
nRBC: 0 % (ref 0.0–0.2)

## 2022-01-12 LAB — COMPREHENSIVE METABOLIC PANEL
ALT: 74 U/L — ABNORMAL HIGH (ref 0–44)
AST: 71 U/L — ABNORMAL HIGH (ref 15–41)
Albumin: 2.9 g/dL — ABNORMAL LOW (ref 3.5–5.0)
Alkaline Phosphatase: 112 U/L (ref 38–126)
Anion gap: 8 (ref 5–15)
BUN: <5 mg/dL — ABNORMAL LOW (ref 6–20)
CO2: 24 mmol/L (ref 22–32)
Calcium: 8.6 mg/dL — ABNORMAL LOW (ref 8.9–10.3)
Chloride: 105 mmol/L (ref 98–111)
Creatinine, Ser: 0.49 mg/dL (ref 0.44–1.00)
GFR, Estimated: >60 mL/min (ref >60)
Glucose, Bld: 167 mg/dL — ABNORMAL HIGH (ref 70–99)
Potassium: 3.5 mmol/L (ref 3.5–5.1)
Sodium: 137 mmol/L (ref 135–145)
Total Bilirubin: 1.4 mg/dL — ABNORMAL HIGH (ref 0.3–1.2)
Total Protein: 6.6 g/dL (ref 6.5–8.1)

## 2022-01-12 MED ORDER — DOCUSATE SODIUM 100 MG PO CAPS
100.0000 mg | ORAL_CAPSULE | Freq: Two times a day (BID) | ORAL | Status: DC
  Administered 2022-01-12: 100 mg via ORAL
  Filled 2022-01-12 (×2): qty 1

## 2022-01-12 MED ORDER — DOCUSATE SODIUM 100 MG PO CAPS: 200.0000 mg | ORAL_CAPSULE | Freq: Two times a day (BID) | ORAL | 0 refills | Status: DC | PRN

## 2022-01-12 MED ORDER — POLYETHYLENE GLYCOL 3350 17 G PO PACK: 17.0000 g | PACK | Freq: Every day | ORAL | 0 refills | Status: DC | PRN

## 2022-01-12 MED ORDER — HYDRALAZINE HCL 20 MG/ML IJ SOLN: 10.0000 mg | INTRAMUSCULAR | Status: DC | PRN

## 2022-01-12 MED ORDER — METOPROLOL TARTRATE 5 MG/5ML IV SOLN: 5.0000 mg | INTRAVENOUS | Status: DC | PRN

## 2022-01-12 MED ORDER — TRAZODONE HCL 50 MG PO TABS: 50.0000 mg | ORAL_TABLET | Freq: Every evening | ORAL | Status: DC | PRN

## 2022-01-12 MED ORDER — OXYCODONE HCL 5 MG PO TABS: 5.0000 mg | ORAL_TABLET | Freq: Four times a day (QID) | ORAL | 0 refills | Status: DC | PRN

## 2022-01-12 MED ORDER — IPRATROPIUM-ALBUTEROL 0.5-2.5 (3) MG/3ML IN SOLN: 3.0000 mL | RESPIRATORY_TRACT | Status: DC | PRN

## 2022-01-12 MED ORDER — CEFADROXIL 1 G PO TABS: 1.0000 g | ORAL_TABLET | Freq: Two times a day (BID) | ORAL | 0 refills | Status: AC

## 2022-01-12 NOTE — Progress Notes (Signed)
Mobility Specialist - Progress Note   Pre-mobility: SpO2 (90) During mobility: SpO2 ( 87) Post-mobility: HR, BP, SPO2 (90)    01/12/22 1000  Mobility  Activity Ambulated independently in hallway;Stood at bedside  Level of Assistance Independent  Assistive Device Front wheel walker  Distance Ambulated (ft) 80 ft  Activity Response Tolerated well  Mobility Referral Yes  $Mobility charge 1 Mobility   Pt resting in bed on RA upon entry. PT STS and endorses dizziness while standing. Pt returns to EOB for 2-3 minute rest break. Pt STS and ambulates in hallway with less dizziness, but defers further mobility. RN notified. Pt returns to EOB and needs left in reach.   Johnathan Hausen Mobility Specialist 01/12/22, 10:42 AM

## 2022-01-12 NOTE — Discharge Summary (Signed)
Physician Discharge Summary  Tammy Decker LKG:[id number] DOB: February 04, 1971 DOA: 01/08/2022  PCP: Mort Sawyers, FNP  Admit date: 01/08/2022 Discharge date: 01/12/2022  Admitted From: Home Disposition:  Home  Recommendations for Outpatient Follow-up:  Follow up with PCP in 1-2 weeks Please obtain BMP/CBC in one week your next doctors visit.  PO Duricef BID for 6 days to finish 10 day course  Pain medications with bowel regimen Advised to slowly adv diet at home  Counseled to quit using EtOH   Discharge Condition: Stable CODE STATUS: Full Diet recommendation: Slowly adv at home as tolerated over the next week.   Brief/Interim Summary: 89F, hx of alcohol abuse, tobacco abuse, hypertension, asthma, GERD, depression with anxiety, obesity obesity BMI 32.89, presented with abdominal pain of 2 days. In the ED had CT scan abdomen pelvis-see report for detail-findings suggestive of acute pancreatitis possibly duodenitis no intestinal obstruction, 2 cm of subtle decreased density in the posterior upper pole of the left kidney-which could be artifact or early changes of pyelonephritis.  Enlarged fatty liver Labs with hyponatremia hyperkalemia transaminitis lipase 304 normal lactic acid procalcitonin, TG, mild leukocytosis.  Patient was admitted Being managed conservatively with IV fluid hydration diet is being advanced slowly.  Urine culture with Citrobacter. Plans to transition to PO duricef for 6 more days to finish 10 day course.  Tolerating PO today, ok for dc.  Husband at bedside.        Acute alcohol induced pancreatitis Transaminitis: Un Complicated pancreatitis being managed conservatively at this time with IV fluids.  Tolerating orals, will dc today with recs as above   Sepsis POA due to to pyelonephritis  Pyelonephritis/UTI due to Citrobacter : urine culture pansensitive Citrobacter koseri.  Pansensitive.  Currently on IV Rocephin, will plan to transition to p.o. Duricef for  6 more days upon dc.    Hypomagnesemia/Hypokalemia repleted and resolved.    Hyponatremia due to #1 continue IV fluids   Mild anemia, macrocytosis monitor   Alcohol abuse at risk of withdrawal:  Cessation advised, current no signs of withdrawal, continue thiamine, PPI, folate CIWA Ativan watch for withdrawals.   Tobacco abuse: Cessation advised   Hypertension:  BP well controlled.Home losartan remains on hold   Constipation  continue MiraLAX daily, add prnDulcolax x1 this am   Asthma not in exacerbation GERD: Continue PPI Class I Obesity:Patient's Body mass index is 32.89 kg/m. : Will benefit with PCP follow-up, weight loss  healthy lifestyle and outpatient sleep evaluation.       Discharge Diagnoses:  Principal Problem:   Alcoholic pancreatitis Active Problems:   Sepsis (HCC)   Acute pyelonephritis   Essential hypertension   Hypokalemia   Asthma   GERD (gastroesophageal reflux disease)   Abnormal LFTs   Alcohol abuse   Tobacco abuse   Obesity with body mass index (BMI) of 30.0 to 39.9      Consultations: None  Subjective: Feels better, no new complaints. Husband at bedside.   Discharge Exam: Vitals:   01/12/22 0508 01/12/22 0700  BP: 116/77 116/75  Pulse: (!) 108 (!) 103  Resp: 18 17  Temp: 97.9 F (36.6 C) 98.4 F (36.9 C)  SpO2: 96% 95%   Vitals:   01/11/22 1552 01/11/22 1915 01/12/22 0508 01/12/22 0700  BP: 133/82 114/78 116/77 116/75  Pulse: (!) 110 (!) 106 (!) 108 (!) 103  Resp: 16 18 18 17   Temp: 98.2 F (36.8 C) 98.9 F (37.2 C) 97.9 F (36.6 C) 98.4 F (36.9 C)  TempSrc: Oral   Oral  SpO2: 91% 94% 96% 95%  Weight:      Height:        General: Pt is alert, awake, not in acute distress Cardiovascular: RRR, S1/S2 +, no rubs, no gallops Respiratory: CTA bilaterally, no wheezing, no rhonchi Abdominal: Soft, NT, ND, bowel sounds + Extremities: no edema, no cyanosis  Discharge Instructions   Allergies as of 01/12/2022        Reactions   Ciprofloxacin Anaphylaxis   Kiwi Extract Anaphylaxis   Sulfa Antibiotics Hives        Medication List     STOP taking these medications    azithromycin 250 MG tablet Commonly known as: ZITHROMAX   predniSONE 10 MG tablet Commonly known as: DELTASONE       TAKE these medications    albuterol 108 (90 Base) MCG/ACT inhaler Commonly known as: ProAir HFA Inhale 2 puffs into the lungs every 6 (six) hours as needed for wheezing or shortness of breath.   cefadroxil 1 g tablet Commonly known as: DURICEF Take 1 tablet (1 g total) by mouth 2 (two) times daily for 6 days.   cyanocobalamin 1000 MCG tablet Commonly known as: VITAMIN B12 Take 1,000 mcg by mouth daily.   docusate sodium 100 MG capsule Commonly known as: COLACE Take 2 capsules (200 mg total) by mouth 2 (two) times daily as needed for mild constipation.   folic acid 1 MG tablet Commonly known as: FOLVITE Take 1 tablet (1 mg total) by mouth daily.   losartan 100 MG tablet Commonly known as: COZAAR Take 1 tablet (100 mg total) by mouth daily.   omeprazole 40 MG capsule Commonly known as: PRILOSEC Take 1 capsule (40 mg total) by mouth daily.   oxyCODONE 5 MG immediate release tablet Commonly known as: Oxy IR/ROXICODONE Take 1 tablet (5 mg total) by mouth every 6 (six) hours as needed for up to 5 days for severe pain or breakthrough pain.   polyethylene glycol 17 g packet Commonly known as: MIRALAX / GLYCOLAX Take 17 g by mouth daily as needed for mild constipation.        Follow-up Information     Mort Sawyers, FNP Follow up in 1 week(s).   Specialty: Family Medicine Contact information: [address] [place] [address] [phone]                Allergies  Allergen Reactions   Ciprofloxacin Anaphylaxis   Kiwi Extract Anaphylaxis   Sulfa Antibiotics Hives    You were cared for by a hospitalist during your hospital stay. If you have any questions about your  discharge medications or the care you received while you were in the hospital after you are discharged, you can call the unit and asked to speak with the hospitalist on call if the hospitalist that took care of you is not available. Once you are discharged, your primary care physician will handle any further medical issues. Please note that no refills for any discharge medications will be authorized once you are discharged, as it is imperative that you return to your primary care physician (or establish a relationship with a primary care physician if you do not have one) for your aftercare needs so that they can reassess your need for medications and monitor your lab values.   Procedures/Studies: DG Abd 1 View  Result Date: 01/12/2022 CLINICAL DATA:  Abdominal distension and pain. Constipation. Pancreatitis and pyelonephritis. EXAM: ABDOMEN - 1 VIEW COMPARISON:  03/12/2021 and CT scan from 03/10/2021 FINDINGS: Dorsal column stimulator unchanged. There is gas and stool in the colon but the stool burden is not significant. No dilated small bowel. Otherwise unremarkable. IMPRESSION: 1. Unremarkable bowel gas pattern. 2. Dorsal column stimulator unchanged. Electronically Signed   By: Gaylyn RongWalter  Liebkemann M.D.   On: 01/12/2022 11:29   DG Abd Portable 1V  Result Date: 01/10/2022 CLINICAL DATA:  Abdominal pain. EXAM: PORTABLE ABDOMEN - 1 VIEW COMPARISON:  CT abdomen pelvis dated January 08, 2022. FINDINGS: The bowel gas pattern is normal. No radio-opaque calculi or other significant radiographic abnormality are seen. Prior cholecystectomy. Spinal cord stimulator again noted. IMPRESSION: 1. Negative. Electronically Signed   By: Obie DredgeWilliam T Derry M.D.   On: 01/10/2022 14:49   CT ABDOMEN PELVIS W CONTRAST  Result Date: 01/08/2022 CLINICAL DATA:  Abdominal pain, constipation EXAM: CT ABDOMEN AND PELVIS WITH CONTRAST TECHNIQUE: Multidetector CT imaging of the abdomen and pelvis was performed using the standard  protocol following bolus administration of intravenous contrast. RADIATION DOSE REDUCTION: This exam was performed according to the departmental dose-optimization program which includes automated exposure control, adjustment of the mA and/or kV according to patient size and/or use of iterative reconstruction technique. CONTRAST:  100mL OMNIPAQUE IOHEXOL 300 MG/ML  SOLN COMPARISON:  05/11/2021 FINDINGS: Lower chest: Heart is enlarged in size. There are small linear densities in both lower lung fields suggesting minimal scarring or subsegmental atelectasis. Hepatobiliary: Liver measures 23.3 cm in length. There is marked fatty infiltration. There is no dilation of bile ducts. Surgical clips are seen in gallbladder fossa. Pancreas: There is stranding in the fat planes adjacent to the head of the pancreas and duodenum. There is mild wall thickening in duodenum. Peripancreatic stranding is extending into the mesentery to the right lower quadrant. There are no loculated fluid collections in or around the pancreas. Spleen: Spleen is not enlarged. Small calcified granulomas are seen in spleen. Adrenals/Urinary Tract: Adrenals are unremarkable. There is no hydronephrosis. Few subcentimeter low-density foci are seen in right renal cortex, possibly cysts. There is 2 cm focus of subtle decreased density in the posterior upper pole of left kidney. There are 2 small calcifications in the lower pole of left kidney each measuring less than 3 mm, possibly nonobstructing renal stones. Urinary bladder is not distended. Stomach/Bowel: Stomach is unremarkable. Small bowel loops are not dilated. Appendix is not dilated. There is no significant wall thickening in colon. There is no pericolic stranding. Vascular/Lymphatic: Scattered arterial calcifications are seen. Reproductive: Mild lobulations are seen in the margin of the uterus. There is 2.5 cm fluid density structure in left adnexa. There is 1.8 cm fluid density structure in the  right adnexa. Other: There is no ascites or pneumoperitoneum. Musculoskeletal: Neurostimulator lead is seen in lower thoracic spinal canal. IMPRESSION: There is stranding in the fat planes adjacent to the head of the pancreas and duodenum suggesting acute pancreatitis and possibly duodenitis. Please correlate with laboratory findings. There is no evidence of focal pancreatic necrosis. There is no evidence of intestinal obstruction or pneumoperitoneum. There is no hydronephrosis. There is 2 cm area of subtle decreased density in the posterior upper pole of left kidney which may be a partial volume averaging artifact or suggest early changes of pyelonephritis. Enlarged fatty liver. Possible small nonobstructing left renal stones. Possible small subcentimeter right renal cysts. Aortic arteriosclerosis. There are low-density structures in both adnexal regions in pelvis, possibly follicles or functional cysts in the ovaries. Other findings as described in the body of  the report. Electronically Signed   By: Ernie Avena M.D.   On: 01/08/2022 09:06   DG Chest 2 View  Result Date: 12/28/2021 CLINICAL DATA:  Cough and shortness of breath EXAM: CHEST - 2 VIEW COMPARISON:  01/03/2020 FINDINGS: Cardiac shadow is within normal limits. Spinal stimulator is again noted. The lungs are well aerated bilaterally. No focal infiltrate or effusion is seen. Postsurgical changes in the cervical spine are noted. IMPRESSION: No active cardiopulmonary disease. Electronically Signed   By: Alcide Clever M.D.   On: 12/28/2021 03:22     The results of significant diagnostics from this hospitalization (including imaging, microbiology, ancillary and laboratory) are listed below for reference.     Microbiology: Recent Results (from the past 240 hour(s))  Urine Culture     Status: Abnormal   Collection Time: 01/08/22  7:47 AM   Specimen: Urine, Clean Catch  Result Value Ref Range Status   Specimen Description   Final    URINE,  CLEAN CATCH Performed at [facility], [address], [place], [place] [address]    Special Requests   Final    NONE Performed at [facility], [address] [place], [place] [address]    Culture >=100,000 COLONIES/mL CITROBACTER KOSERI (A)  Final   Report Status 01/10/2022 FINAL  Final   Organism ID, Bacteria CITROBACTER KOSERI (A)  Final      Susceptibility   Citrobacter koseri - MIC*    CEFAZOLIN <=4 SENSITIVE Sensitive     CEFEPIME <=0.12 SENSITIVE Sensitive     CEFTRIAXONE <=0.25 SENSITIVE Sensitive     CIPROFLOXACIN <=0.25 SENSITIVE Sensitive     GENTAMICIN <=1 SENSITIVE Sensitive     IMIPENEM <=0.25 SENSITIVE Sensitive     NITROFURANTOIN <=16 SENSITIVE Sensitive     TRIMETH/SULFA <=20 SENSITIVE Sensitive     PIP/TAZO <=4 SENSITIVE Sensitive     * >=100,000 COLONIES/mL CITROBACTER KOSERI  Culture, blood (x 2)     Status: None (Preliminary result)   Collection Time: 01/09/22 12:21 AM   Specimen: BLOOD  Result Value Ref Range Status   Specimen Description BLOOD RIGHT ASSIST CONTROL  Final   Special Requests   Final    BOTTLES DRAWN AEROBIC AND ANAEROBIC Blood Culture results may not be optimal due to an excessive volume of blood received in culture bottles   Culture   Final    NO GROWTH 3 DAYS Performed at [facility], [address], [place], [place] [address]    Report Status PENDING  Incomplete  Culture, blood (x 2)     Status: None (Preliminary result)   Collection Time: 01/09/22 12:21 AM   Specimen: BLOOD  Result Value Ref Range Status   Specimen Description BLOOD LEFT HAND  Final   Special Requests   Final    BOTTLES DRAWN AEROBIC AND ANAEROBIC Blood Culture adequate volume   Culture   Final    NO GROWTH 3 DAYS Performed at [facility], [address] [place], [place] [address]    Report Status PENDING  Incomplete     Labs: BNP (last 3 results) No results for input(s): "BNP" in the last 8760 hours. Basic  Metabolic Panel: Recent Labs  Lab 01/08/22 0747 01/08/22 0952 01/09/22 0638 01/10/22 0527 01/11/22 0415 01/12/22 0449  NA 132*  --  134* 133* 136 137  K 3.3*  --  3.5 3.2* 3.5 3.5  CL 98  --  100 101 107 105  CO2 23  --  25 22 24 24   GLUCOSE 156*  --  126* 111* 167* 167*  BUN 8  --  8 7 6  <5*  CREATININE 0.53  --  0.44 0.45 0.41* 0.49  CALCIUM 9.1  --  8.1* 8.1* 8.3* 8.6*  MG  --  1.4*  --  1.9  --   --   PHOS  --  2.8  --   --   --   --    Liver Function Tests: Recent Labs  Lab 01/08/22 0747 01/10/22 0527 01/11/22 0415 01/12/22 0449  AST 93* 89* 85* 71*  ALT 90* 87* 86* 74*  ALKPHOS 110 108 118 112  BILITOT 1.9* 1.9* 1.3* 1.4*  PROT 8.2* 6.7 6.8 6.6  ALBUMIN 3.9 2.9* 2.9* 2.9*   Recent Labs  Lab 01/08/22 0747 01/09/22 0638  LIPASE 304* 84*   No results for input(s): "AMMONIA" in the last 168 hours. CBC: Recent Labs  Lab 01/08/22 0747 01/09/22 0638 01/10/22 0527 01/11/22 0415 01/12/22 0449  WBC 14.8* 13.9* 12.3* 10.0 8.8  NEUTROABS 12.3*  --   --   --   --   HGB 14.9 12.7 11.8* 11.6* 11.6*  HCT 42.1 37.1 34.5* 33.8* 34.5*  MCV 99.8 103.9* 103.9* 105.3* 105.8*  PLT 294 211 211 221 245   Cardiac Enzymes: No results for input(s): "CKTOTAL", "CKMB", "CKMBINDEX", "TROPONINI" in the last 168 hours. BNP: Invalid input(s): "POCBNP" CBG: Recent Labs  Lab 01/09/22 0754 01/10/22 0843 01/11/22 1121 01/11/22 1734  GLUCAP 128* 105* 131* 136*   D-Dimer No results for input(s): "DDIMER" in the last 72 hours. Hgb A1c No results for input(s): "HGBA1C" in the last 72 hours. Lipid Profile No results for input(s): "CHOL", "HDL", "LDLCALC", "TRIG", "CHOLHDL", "LDLDIRECT" in the last 72 hours. Thyroid function studies No results for input(s): "TSH", "T4TOTAL", "T3FREE", "THYROIDAB" in the last 72 hours.  Invalid input(s): "FREET3" Anemia work up No results for input(s): "VITAMINB12", "FOLATE", "FERRITIN", "TIBC", "IRON", "RETICCTPCT" in the last 72  hours. Urinalysis    Component Value Date/Time   COLORURINE YELLOW (A) 01/08/2022 0747   APPEARANCEUR HAZY (A) 01/08/2022 0747   LABSPEC >1.046 (H) 01/08/2022 0747   PHURINE 6.0 01/08/2022 0747   GLUCOSEU NEGATIVE 01/08/2022 0747   HGBUR SMALL (A) 01/08/2022 0747   BILIRUBINUR NEGATIVE 01/08/2022 0747   BILIRUBINUR Negative 12/07/2021 1602   KETONESUR 20 (A) 01/08/2022 0747   PROTEINUR 30 (A) 01/08/2022 0747   UROBILINOGEN 0.2 12/07/2021 1602   NITRITE NEGATIVE 01/08/2022 0747   LEUKOCYTESUR NEGATIVE 01/08/2022 0747   Sepsis Labs Recent Labs  Lab 01/09/22 0638 01/10/22 0527 01/11/22 0415 01/12/22 0449  WBC 13.9* 12.3* 10.0 8.8   Microbiology Recent Results (from the past 240 hour(s))  Urine Culture     Status: Abnormal   Collection Time: 01/08/22  7:47 AM   Specimen: Urine, Clean Catch  Result Value Ref Range Status   Specimen Description   Final    URINE, CLEAN CATCH Performed at [facility], [address] [place], [address] [place]    Special Requests   Final    NONE Performed at [facility], [address] [place], [address] [place]    Culture >=100,000 COLONIES/mL CITROBACTER KOSERI (A)  Final   Report Status 01/10/2022 FINAL  Final   Organism ID, Bacteria CITROBACTER KOSERI (A)  Final      Susceptibility   Citrobacter koseri - MIC*    CEFAZOLIN <=4 SENSITIVE Sensitive     CEFEPIME <=0.12 SENSITIVE Sensitive  CEFTRIAXONE <=0.25 SENSITIVE Sensitive     CIPROFLOXACIN <=0.25 SENSITIVE Sensitive     GENTAMICIN <=1 SENSITIVE Sensitive     IMIPENEM <=0.25 SENSITIVE Sensitive     NITROFURANTOIN <=16 SENSITIVE Sensitive     TRIMETH/SULFA <=20 SENSITIVE Sensitive     PIP/TAZO <=4 SENSITIVE Sensitive     * >=100,000 COLONIES/mL CITROBACTER KOSERI  Culture, blood (x 2)     Status: None (Preliminary result)   Collection Time: 01/09/22 12:21 AM   Specimen: BLOOD  Result Value Ref Range Status   Specimen Description BLOOD RIGHT ASSIST  CONTROL  Final   Special Requests   Final    BOTTLES DRAWN AEROBIC AND ANAEROBIC Blood Culture results may not be optimal due to an excessive volume of blood received in culture bottles   Culture   Final    NO GROWTH 3 DAYS Performed at [facility], [address] [place], [place] [address]    Report Status PENDING  Incomplete  Culture, blood (x 2)     Status: None (Preliminary result)   Collection Time: 01/09/22 12:21 AM   Specimen: BLOOD  Result Value Ref Range Status   Specimen Description BLOOD LEFT HAND  Final   Special Requests   Final    BOTTLES DRAWN AEROBIC AND ANAEROBIC Blood Culture adequate volume   Culture   Final    NO GROWTH 3 DAYS Performed at [facility], [address] [place], [place] [address]    Report Status PENDING  Incomplete     Time coordinating discharge:  I have spent 35 minutes face to face with the patient and on the ward discussing the patients care, assessment, plan and disposition with other care givers. >50% of the time was devoted counseling the patient about the risks and benefits of treatment/Discharge disposition and coordinating care.   SIGNED:   Dimple Nanas, MD  [facility] 01/12/2022, 1:21 PM   If 7PM-7AM, please contact night-coverage

## 2022-01-12 NOTE — Progress Notes (Signed)
Tammy Decker to be D/C'd Home per MD order.  Discussed prescriptions and follow up appointments with the patient. Prescriptions given to patient, medication list explained in detail. Pt verbalized understanding.  Allergies as of 01/12/2022       Reactions   Ciprofloxacin Anaphylaxis   Kiwi Extract Anaphylaxis   Sulfa Antibiotics Hives        Medication List     STOP taking these medications    azithromycin 250 MG tablet Commonly known as: ZITHROMAX   predniSONE 10 MG tablet Commonly known as: DELTASONE       TAKE these medications    albuterol 108 (90 Base) MCG/ACT inhaler Commonly known as: ProAir HFA Inhale 2 puffs into the lungs every 6 (six) hours as needed for wheezing or shortness of breath.   cefadroxil 1 g tablet Commonly known as: DURICEF Take 1 tablet (1 g total) by mouth 2 (two) times daily for 6 days.   cyanocobalamin 1000 MCG tablet Commonly known as: VITAMIN B12 Take 1,000 mcg by mouth daily.   docusate sodium 100 MG capsule Commonly known as: COLACE Take 2 capsules (200 mg total) by mouth 2 (two) times daily as needed for mild constipation.   folic acid 1 MG tablet Commonly known as: FOLVITE Take 1 tablet (1 mg total) by mouth daily.   losartan 100 MG tablet Commonly known as: COZAAR Take 1 tablet (100 mg total) by mouth daily.   omeprazole 40 MG capsule Commonly known as: PRILOSEC Take 1 capsule (40 mg total) by mouth daily.   oxyCODONE 5 MG immediate release tablet Commonly known as: Oxy IR/ROXICODONE Take 1 tablet (5 mg total) by mouth every 6 (six) hours as needed for up to 5 days for severe pain or breakthrough pain.   polyethylene glycol 17 g packet Commonly known as: MIRALAX / GLYCOLAX Take 17 g by mouth daily as needed for mild constipation.        Vitals:   01/12/22 0508 01/12/22 0700  BP: 116/77 116/75  Pulse: (!) 108 (!) 103  Resp: 18 17  Temp: 97.9 F (36.6 C) 98.4 F (36.9 C)  SpO2: 96% 95%    Skin  clean, dry and intact without evidence of skin break down, no evidence of skin tears noted. IV catheter discontinued intact. Site without signs and symptoms of complications. Dressing and pressure applied. Pt denies pain at this time. No complaints noted.  An After Visit Summary was printed and given to the patient. Patient escorted via WC, and D/C home via private auto.  Tammy Decker C. Jilda Roche

## 2022-01-14 ENCOUNTER — Telehealth: Payer: Self-pay

## 2022-01-14 LAB — CULTURE, BLOOD (ROUTINE X 2)
Culture: NO GROWTH
Culture: NO GROWTH
Special Requests: ADEQUATE

## 2022-01-14 NOTE — Telephone Encounter (Signed)
Transition Care Management Follow-up Telephone Call Date of discharge and from where: [facility] on 11/11  How have you been since you were released from the hospital? Doing ok pain has improved some but still there Any questions or concerns? No  Items Reviewed: Did the pt receive and understand the discharge instructions provided? Yes  Medications obtained and verified? Yes  Other? No  Any new allergies since your discharge? No  Dietary orders reviewed? Yes continued soft diet that she was in at hospital  Do you have support at home? Yes   Home Care and Equipment/Supplies: Were home health services ordered? no If so, what is the name of the agency? N/A   Has the agency set up a time to come to the patient's home? not applicable Were any new equipment or medical supplies ordered?  No What is the name of the medical supply agency? N/A Were you able to get the supplies/equipment? not applicable Do you have any questions related to the use of the equipment or supplies? No  Functional Questionnaire: (I = Independent and D = Dependent) ADLs: I  Bathing/Dressing- I  Meal Prep- I  Eating- I  Maintaining continence- I  Transferring/Ambulation- I  Managing Meds- I  Follow up appointments reviewed:  PCP Hospital f/u appt confirmed? Yes  Scheduled to see Tabitha on 01/15/2022 @ 9:00. Specialist Hospital f/u appt confirmed? No  none recommended  Are transportation arrangements needed? No  If their condition worsens, is the pt aware to call PCP or go to the Emergency Dept.? Yes Was the patient provided with contact information for the PCP's office or ED? Yes Was to pt encouraged to call back with questions or concerns? Yes

## 2022-01-15 ENCOUNTER — Encounter: Payer: Self-pay | Admitting: Family

## 2022-01-15 ENCOUNTER — Ambulatory Visit (INDEPENDENT_AMBULATORY_CARE_PROVIDER_SITE_OTHER): Payer: No Typology Code available for payment source | Admitting: Family

## 2022-01-15 VITALS — BP 130/82 | HR 115 | Temp 98.6°F | Resp 16 | Ht 67.0 in | Wt 238.5 lb

## 2022-01-15 DIAGNOSIS — R7989 Other specified abnormal findings of blood chemistry: Secondary | ICD-10-CM | POA: Diagnosis not present

## 2022-01-15 DIAGNOSIS — K852 Alcohol induced acute pancreatitis without necrosis or infection: Secondary | ICD-10-CM | POA: Diagnosis not present

## 2022-01-15 DIAGNOSIS — A419 Sepsis, unspecified organism: Secondary | ICD-10-CM

## 2022-01-15 DIAGNOSIS — R002 Palpitations: Secondary | ICD-10-CM

## 2022-01-15 DIAGNOSIS — D649 Anemia, unspecified: Secondary | ICD-10-CM | POA: Diagnosis not present

## 2022-01-15 DIAGNOSIS — E876 Hypokalemia: Secondary | ICD-10-CM

## 2022-01-15 DIAGNOSIS — F1010 Alcohol abuse, uncomplicated: Secondary | ICD-10-CM

## 2022-01-15 DIAGNOSIS — N10 Acute tubulo-interstitial nephritis: Secondary | ICD-10-CM | POA: Diagnosis not present

## 2022-01-15 DIAGNOSIS — R000 Tachycardia, unspecified: Secondary | ICD-10-CM

## 2022-01-15 LAB — CBC
HCT: 40.6 % (ref 36.0–46.0)
Hemoglobin: 13.6 g/dL (ref 12.0–15.0)
MCHC: 33.4 g/dL (ref 30.0–36.0)
MCV: 107.4 fl — ABNORMAL HIGH (ref 78.0–100.0)
Platelets: 424 10*3/uL — ABNORMAL HIGH (ref 150.0–400.0)
RBC: 3.78 Mil/uL — ABNORMAL LOW (ref 3.87–5.11)
RDW: 14.8 % (ref 11.5–15.5)
WBC: 12.9 10*3/uL — ABNORMAL HIGH (ref 4.0–10.5)

## 2022-01-15 LAB — BASIC METABOLIC PANEL
BUN: 4 mg/dL — ABNORMAL LOW (ref 6–23)
CO2: 28 mEq/L (ref 19–32)
Calcium: 9.2 mg/dL (ref 8.4–10.5)
Chloride: 101 mEq/L (ref 96–112)
Creatinine, Ser: 0.53 mg/dL (ref 0.40–1.20)
GFR: 108.31 mL/min (ref >60.00)
Glucose, Bld: 150 mg/dL — ABNORMAL HIGH (ref 70–99)
Potassium: 3.2 mEq/L — ABNORMAL LOW (ref 3.5–5.1)
Sodium: 136 mEq/L (ref 135–145)

## 2022-01-15 LAB — MAGNESIUM: Magnesium: 1.6 mg/dL (ref 1.5–2.5)

## 2022-01-15 MED ORDER — METOPROLOL TARTRATE 25 MG PO TABS: 25.0000 mg | ORAL_TABLET | Freq: Two times a day (BID) | ORAL | 0 refills | Status: DC

## 2022-01-15 MED ORDER — POTASSIUM CHLORIDE CRYS ER 20 MEQ PO TBCR: 20.0000 meq | EXTENDED_RELEASE_TABLET | Freq: Two times a day (BID) | ORAL | 0 refills | Status: DC

## 2022-01-15 NOTE — Assessment & Plan Note (Signed)
In very recent remission, without since admitted to hospital 11/7.  Commended on effort.  Pt to continue with AA and also maintaining appt with Child psychotherapist for eval/treat

## 2022-01-15 NOTE — Progress Notes (Signed)
Established Patient Office Visit  Subjective:  Patient ID: Tammy Decker, female    DOB: 1970/04/12  Age: 51 y.o. MRN: [id number]  CC:  Chief Complaint  Patient presents with   Hospitalization Follow-up    HPI Tammy Decker is here for hospital follow up.   Hospital: [facility] Admit: 11/7 Discharge:11/11 Discharge dx: alcoholic pancreatitis , sepsis due to pyelonephritis (iv rocephin) currently on duricef Discharge medications:discharge with duricef bid for six days /to complete 10 day course   While in hospital , blood cultures x 2, came back negative. Lfts increased.  Discharge recommendations: repeat cbc bmp in one week.  Heart rate elevated, states was elevated in hospital. Currently at 115. Pt states occasional palpitations.  Has been checking at home and resting between 115/120.   Acute concerns:  Spoke with case worker, who retired her to do a detox prior to being treated, and she is set up to f/u with him Gwyndolyn Saxon garrett), she has to call him to set up the appointment.  Last alcohol drink was 01/08/22.  Started attending AA two nights ago.   Past Medical History:  Diagnosis Date   Asthma    Complication of anesthesia    OXYGEN SOMETIMES DROPS LOW   GERD (gastroesophageal reflux disease)    Hypertension    Tachycardia 2020    Past Surgical History:  Procedure Laterality Date   BACK SURGERY     CHOLECYSTECTOMY     ESOPHAGOGASTRODUODENOSCOPY (EGD) WITH PROPOFOL N/A 06/19/2021   Procedure: ESOPHAGOGASTRODUODENOSCOPY (EGD) WITH PROPOFOL;  Surgeon: Lin Landsman, MD;  Location: [facility] ENDOSCOPY;  Service: Gastroenterology;  Laterality: N/A;   NECK SURGERY     uterine ablation     XI ROBOTIC ASSISTED VENTRAL HERNIA N/A 05/09/2020   Procedure: XI ROBOTIC ASSISTED VENTRAL HERNIA, incisional hernia;  Surgeon: Jules Husbands, MD;  Location: [facility] ORS;  Service: General;  Laterality: N/A;    Family History  Problem Relation Age of Onset   Diabetes Mother     Diabetes Father    Heart attack Father    Ovarian cancer Maternal Grandmother        older dx maybe early 66s   Colon cancer Maternal Grandfather        in his 52's   Early death Paternal Grandfather    Heart attack Paternal Grandfather     Social History   Socioeconomic History   Marital status: Married    Spouse name: Not on file   Number of children: Not on file   Years of education: Not on file   Highest education level: Not on file  Occupational History   Not on file  Tobacco Use   Smoking status: Every Day    Packs/day: 0.15    Types: Cigarettes    Passive exposure: Past   Smokeless tobacco: Never   Tobacco comments:    down to 3 cigs per day  Vaping Use   Vaping Use: Never used  Substance and Sexual Activity   Alcohol use: Yes    Comment: 1/5 whiskey nightly   Drug use: Never   Sexual activity: Yes    Partners: Male    Birth control/protection: None, Post-menopausal  Other Topics Concern   Not on file  Social History Narrative   Not on file   Social Determinants of Health   Financial Resource Strain: Not on file  Food Insecurity: No Food Insecurity (01/08/2022)   Hunger Vital Sign    Worried About Running Out of  Food in the Last Year: Never true    [place] in the Last Year: Never true  Transportation Needs: No Transportation Needs (01/08/2022)   PRAPARE - Hydrologist (Medical): No    Lack of Transportation (Non-Medical): No  Physical Activity: Not on file  Stress: Not on file  Social Connections: Not on file  Intimate Partner Violence: Not At Risk (01/08/2022)   Humiliation, Afraid, Rape, and Kick questionnaire    Fear of Current or Ex-Partner: No    Emotionally Abused: No    Physically Abused: No    Sexually Abused: No    Outpatient Medications Prior to Visit  Medication Sig Dispense Refill   albuterol (PROAIR HFA) 108 (90 Base) MCG/ACT inhaler Inhale 2 puffs into the lungs every 6 (six) hours as needed for  wheezing or shortness of breath. 1 each 0   cefadroxil (DURICEF) 1 g tablet Take 1 tablet (1 g total) by mouth 2 (two) times daily for 6 days. 12 tablet 0   cyanocobalamin (VITAMIN B12) 1000 MCG tablet Take 1,000 mcg by mouth daily.     docusate sodium (COLACE) 100 MG capsule Take 2 capsules (200 mg total) by mouth 2 (two) times daily as needed for mild constipation. 60 capsule 0   folic acid (FOLVITE) 1 MG tablet Take 1 tablet (1 mg total) by mouth daily. 30 tablet 1   losartan (COZAAR) 100 MG tablet Take 1 tablet (100 mg total) by mouth daily. 90 tablet 3   omeprazole (PRILOSEC) 40 MG capsule Take 1 capsule (40 mg total) by mouth daily. 60 capsule 0   polyethylene glycol (MIRALAX / GLYCOLAX) 17 g packet Take 17 g by mouth daily as needed for mild constipation. 14 each 0   oxyCODONE (OXY IR/ROXICODONE) 5 MG immediate release tablet Take 1 tablet (5 mg total) by mouth every 6 (six) hours as needed for up to 5 days for severe pain or breakthrough pain. (Patient not taking: Reported on 01/15/2022) 20 tablet 0   No facility-administered medications prior to visit.    Allergies  Allergen Reactions   Ciprofloxacin Anaphylaxis   Kiwi Extract Anaphylaxis   Sulfa Antibiotics Hives        Objective:    Physical Exam Vitals reviewed.  Constitutional:      General: She is not in acute distress.    Appearance: Normal appearance. She is obese. She is not ill-appearing, toxic-appearing or diaphoretic.  Cardiovascular:     Rate and Rhythm: Regular rhythm. Tachycardia present.     Heart sounds: No murmur heard. Pulmonary:     Effort: Pulmonary effort is normal.     Breath sounds: Normal breath sounds.  Abdominal:     General: Bowel sounds are normal. There is distension.     Palpations: Abdomen is soft. There is no mass.     Tenderness: There is generalized abdominal tenderness. There is no guarding or rebound.     Hernia: No hernia is present.  Neurological:     Mental Status: She is  alert.       BP 130/82   Pulse (!) 115   Temp 98.6 F (37 C)   Resp 16   Ht _0  (1.702 m)   Wt 238 lb 8 oz (108.2 kg)   SpO2 96%   BMI 37.35 kg/m  Wt Readings from Last 3 Encounters:  01/15/22 238 lb 8 oz (108.2 kg)  01/08/22 210 lb (95.3 kg)  12/26/21 238 lb (  108 kg)     Health Maintenance Due  Topic Date Due   COVID-19 Vaccine (1) Never done   PAP SMEAR-Modifier  Never done   COLONOSCOPY (Pts 45-30yr Insurance coverage will need to be confirmed)  Never done    There are no preventive care reminders to display for this patient.  Lab Results  Component Value Date   TSH 3.04 12/19/2021   Lab Results  Component Value Date   WBC 12.9 (H) 01/15/2022   HGB 13.6 01/15/2022   HCT 40.6 01/15/2022   MCV 107.4 (H) 01/15/2022   PLT 424.0 (H) 01/15/2022   Lab Results  Component Value Date   NA 136 01/15/2022   K 3.2 (L) 01/15/2022   CO2 28 01/15/2022   GLUCOSE 150 (H) 01/15/2022   BUN 4 (L) 01/15/2022   CREATININE 0.53 01/15/2022   BILITOT 1.4 (H) 01/12/2022   ALKPHOS 112 01/12/2022   AST 71 (H) 01/12/2022   ALT 74 (H) 01/12/2022   PROT 6.6 01/12/2022   ALBUMIN 2.9 (L) 01/12/2022   CALCIUM 9.2 01/15/2022   ANIONGAP 8 01/12/2022   EGFR 113 09/11/2020   GFR 108.31 01/15/2022   Lab Results  Component Value Date   CHOL 217 (H) 09/11/2020   Lab Results  Component Value Date   HDL 81 09/11/2020   Lab Results  Component Value Date   LDLCALC 121 (H) 09/11/2020   Lab Results  Component Value Date   TRIG 77 01/08/2022   Lab Results  Component Value Date   CHOLHDL 2.7 09/11/2020   Lab Results  Component Value Date   HGBA1C 6.1 12/19/2021      Assessment & Plan:   Problem List Items Addressed This Visit       Digestive   Alcoholic pancreatitis    Improving, lipase reviewed, trending down. Pt plans to abstain from alcohol.       Relevant Orders   Basic metabolic panel (Completed)     Genitourinary   Acute pyelonephritis    Continue  current treatment with duricef and complete in its entirety.  Advised to monitor for acute concerns to return to hospital       Relevant Orders   CBC (Completed)     Other   Alcohol abuse    In very recent remission, without since admitted to hospital 11/7.  Commended on effort.  Pt to continue with AA and also maintaining appt with sEducation officer, museumfor eval/treat       Heart rate fast    Start metoprolol tartrate 25 mg po bid for rate control  Choosing short acting over long acting for risk of alcohol use along with Advised pt if any cp worsening or sudden sob go to er and or call 911. Also referral placed for cardiologist for eval/treat      Relevant Medications   metoprolol tartrate (LOPRESSOR) 25 MG tablet   Sepsis ([place]    Reviewed most recent blood cultures, negative for infection.   Continue duricef as prescribed for complete duration.       Hypokalemia - Primary   Relevant Orders   Basic metabolic panel (Completed)   Abnormal LFTs    Taking this into consideration with initiation of metoprolol, no hepatic dosing recommendations however will start at low dose and short acting. Will also continue to monitor liver function.        Relevant Orders   Basic metabolic panel (Completed)   Other Visit Diagnoses     Hypomagnesemia  Relevant Orders   Magnesium (Completed)   Anemia, unspecified type       Relevant Orders   CBC (Completed)   Tachycardia       Relevant Orders   Ambulatory referral to Cardiology   Palpitations       Relevant Orders   Ambulatory referral to Cardiology       Meds ordered this encounter  Medications   DISCONTD: metoprolol tartrate (LOPRESSOR) 25 MG tablet    Sig: Take 1 tablet (25 mg total) by mouth 2 (two) times daily.    Dispense:  60 tablet    Refill:  0    Order Specific Question:   Supervising Provider    Answer:   BEDSOLE, AMY E [[id number]]   metoprolol tartrate (LOPRESSOR) 25 MG tablet    Sig: Take 1 tablet (25 mg total) by  mouth 2 (two) times daily.    Dispense:  60 tablet    Refill:  0    Order Specific Question:   Supervising Provider    Answer:   Diona Browner, AMY E [[id number]]    Follow-up: Return in about 3 weeks (around 02/05/2022) for f/u pancreatitis.    Eugenia Pancoast, FNP

## 2022-01-15 NOTE — Assessment & Plan Note (Addendum)
Start metoprolol tartrate 25 mg po bid for rate control  Choosing short acting over long acting for risk of alcohol use along with Advised pt if any cp worsening or sudden sob go to er and or call 911. Also referral placed for cardiologist for eval/treat

## 2022-01-15 NOTE — Assessment & Plan Note (Signed)
Improving, lipase reviewed, trending down. Pt plans to abstain from alcohol.

## 2022-01-15 NOTE — Assessment & Plan Note (Signed)
Reviewed most recent blood cultures, negative for infection.   Continue duricef as prescribed for complete duration.

## 2022-01-15 NOTE — Assessment & Plan Note (Signed)
Continue current treatment with duricef and complete in its entirety.  Advised to monitor for acute concerns to return to hospital

## 2022-01-15 NOTE — Patient Instructions (Addendum)
  Stop by the lab prior to leaving today. I will notify you of your results once received.   Call garrett to set up appointment to get started.  Continue with AA.   Regards,   Mort Sawyers FNP-C

## 2022-01-15 NOTE — Progress Notes (Signed)
Anemia has improved.  White blood cells slightly elevated from three days ago. I advise we repeat in one week.  Potassium trending downwards, is pt taking any potassium otc at this time?  Magnesium back to normal.

## 2022-01-15 NOTE — Assessment & Plan Note (Signed)
Taking this into consideration with initiation of metoprolol, no hepatic dosing recommendations however will start at low dose and short acting. Will also continue to monitor liver function.

## 2022-01-17 ENCOUNTER — Telehealth (HOSPITAL_COMMUNITY): Payer: Self-pay | Admitting: Licensed Clinical Social Worker

## 2022-01-17 MED ORDER — TRAZODONE HCL 50 MG PO TABS: 50.0000 mg | ORAL_TABLET | Freq: Every evening | ORAL | 3 refills | Status: DC | PRN

## 2022-01-17 NOTE — Telephone Encounter (Signed)
The therapist receives a call from [place] confirming her identity via two identifiers. She says that she last drank alcohol on 01/05/22 as she was admitted to the hospital for a week due to sepsis and alcohol-induced pancreatitis.  She denies any cravings to drink but says that she is not sleeping well. She notes that her doctor talked to her about "Suboxone" for cravings though she like means naltrexone. She says that she is not interested in this at this time but does want something to help with her sleep.  Rodolfo says that she has an AA meeting schedule noting that there is a women's meeting close to where she lives at [facility].  The therapist talks to her about medication options for sleep. Seema will call her PCP to see if she will prescribe Trazodone and sign a ROI so that her PCP can communicate with this office. After her CCA, this therapist will likely reach out to Mr. Maryjean Morn, P.A. to see if he is able to see her as she has an interest in being followed by an addictionologist.  The therapist discusses treatment options with her with Miamarie saying that she wants to try individual therapy and AA first; however, she is willing to go to a higher level of care should this not be effective.  Myrna Blazer, MA, LCSW, [facility], LCAS 01/17/2022

## 2022-01-17 NOTE — Addendum Note (Signed)
Addended by: Mort Sawyers on: 01/17/2022 01:04 PM   Modules accepted: Orders

## 2022-01-22 ENCOUNTER — Ambulatory Visit (INDEPENDENT_AMBULATORY_CARE_PROVIDER_SITE_OTHER): Payer: PRIVATE HEALTH INSURANCE | Admitting: Licensed Clinical Social Worker

## 2022-01-22 ENCOUNTER — Encounter: Payer: Self-pay | Admitting: Family

## 2022-01-22 ENCOUNTER — Ambulatory Visit (INDEPENDENT_AMBULATORY_CARE_PROVIDER_SITE_OTHER): Payer: No Typology Code available for payment source | Admitting: Family

## 2022-01-22 ENCOUNTER — Encounter: Payer: Self-pay | Admitting: *Deleted

## 2022-01-22 ENCOUNTER — Encounter (HOSPITAL_COMMUNITY): Payer: Self-pay

## 2022-01-22 ENCOUNTER — Ambulatory Visit: Payer: No Typology Code available for payment source | Admitting: Family

## 2022-01-22 VITALS — HR 95 | Temp 97.9°F | Resp 16 | Ht 67.0 in | Wt 234.1 lb

## 2022-01-22 DIAGNOSIS — F1020 Alcohol dependence, uncomplicated: Secondary | ICD-10-CM | POA: Diagnosis not present

## 2022-01-22 DIAGNOSIS — R809 Proteinuria, unspecified: Secondary | ICD-10-CM

## 2022-01-22 DIAGNOSIS — Z720 Tobacco use: Secondary | ICD-10-CM

## 2022-01-22 DIAGNOSIS — R000 Tachycardia, unspecified: Secondary | ICD-10-CM

## 2022-01-22 DIAGNOSIS — R109 Unspecified abdominal pain: Secondary | ICD-10-CM | POA: Insufficient documentation

## 2022-01-22 DIAGNOSIS — N281 Cyst of kidney, acquired: Secondary | ICD-10-CM

## 2022-01-22 DIAGNOSIS — Z87898 Personal history of other specified conditions: Secondary | ICD-10-CM | POA: Insufficient documentation

## 2022-01-22 DIAGNOSIS — K852 Alcohol induced acute pancreatitis without necrosis or infection: Secondary | ICD-10-CM

## 2022-01-22 DIAGNOSIS — Z87448 Personal history of other diseases of urinary system: Secondary | ICD-10-CM

## 2022-01-22 DIAGNOSIS — F39 Unspecified mood [affective] disorder: Secondary | ICD-10-CM

## 2022-01-22 LAB — URINALYSIS, ROUTINE W REFLEX MICROSCOPIC
Hgb urine dipstick: NEGATIVE
Ketones, ur: NEGATIVE
Leukocytes,Ua: NEGATIVE
Nitrite: NEGATIVE
RBC / HPF: NONE SEEN (ref 0–?)
Specific Gravity, Urine: 1.025 (ref 1.000–1.030)
Total Protein, Urine: NEGATIVE
Urine Glucose: NEGATIVE
Urobilinogen, UA: 0.2 (ref 0.0–1.0)
pH: 6 (ref 5.0–8.0)

## 2022-01-22 LAB — COMPREHENSIVE METABOLIC PANEL
ALT: 51 U/L — ABNORMAL HIGH (ref 0–35)
AST: 62 U/L — ABNORMAL HIGH (ref 0–37)
Albumin: 4.2 g/dL (ref 3.5–5.2)
Alkaline Phosphatase: 120 U/L — ABNORMAL HIGH (ref 39–117)
BUN: 8 mg/dL (ref 6–23)
CO2: 28 mEq/L (ref 19–32)
Calcium: 9.8 mg/dL (ref 8.4–10.5)
Chloride: 100 mEq/L (ref 96–112)
Creatinine, Ser: 0.64 mg/dL (ref 0.40–1.20)
GFR: 103.48 mL/min (ref >60.00)
Glucose, Bld: 103 mg/dL — ABNORMAL HIGH (ref 70–99)
Potassium: 4.2 mEq/L (ref 3.5–5.1)
Sodium: 136 mEq/L (ref 135–145)
Total Bilirubin: 0.7 mg/dL (ref 0.2–1.2)
Total Protein: 7.6 g/dL (ref 6.0–8.3)

## 2022-01-22 LAB — CBC WITH DIFFERENTIAL/PLATELET
Basophils Absolute: 0.1 10*3/uL (ref 0.0–0.1)
Basophils Relative: 1.3 % (ref 0.0–3.0)
Eosinophils Absolute: 0.2 10*3/uL (ref 0.0–0.7)
Eosinophils Relative: 1.8 % (ref 0.0–5.0)
HCT: 43 % (ref 36.0–46.0)
Hemoglobin: 14.4 g/dL (ref 12.0–15.0)
Lymphocytes Relative: 17.1 % (ref 12.0–46.0)
Lymphs Abs: 2 10*3/uL (ref 0.7–4.0)
MCHC: 33.6 g/dL (ref 30.0–36.0)
MCV: 105.6 fl — ABNORMAL HIGH (ref 78.0–100.0)
Monocytes Absolute: 0.6 10*3/uL (ref 0.1–1.0)
Monocytes Relative: 5.1 % (ref 3.0–12.0)
Neutro Abs: 8.5 10*3/uL — ABNORMAL HIGH (ref 1.4–7.7)
Neutrophils Relative %: 74.7 % (ref 43.0–77.0)
Platelets: 438 10*3/uL — ABNORMAL HIGH (ref 150.0–400.0)
RBC: 4.07 Mil/uL (ref 3.87–5.11)
RDW: 14.3 % (ref 11.5–15.5)
WBC: 11.5 10*3/uL — ABNORMAL HIGH (ref 4.0–10.5)

## 2022-01-22 LAB — LIPASE: Lipase: 123 U/L — ABNORMAL HIGH (ref 11.0–59.0)

## 2022-01-22 MED ORDER — CEFTRIAXONE SODIUM 1 G IJ SOLR: 1.0000 g | Freq: Once | INTRAMUSCULAR | 0 refills | Status: DC

## 2022-01-22 MED ORDER — AMOXICILLIN-POT CLAVULANATE 875-125 MG PO TABS: 1.0000 | ORAL_TABLET | Freq: Two times a day (BID) | ORAL | 0 refills | Status: DC

## 2022-01-22 MED ORDER — CEFTRIAXONE SODIUM 1 G IJ SOLR
1.0000 g | Freq: Once | INTRAMUSCULAR | Status: AC
  Administered 2022-01-22: 1 g via INTRAMUSCULAR

## 2022-01-22 NOTE — Assessment & Plan Note (Signed)
Concern as just completed duration of therapy antbx for pyelonephritis.  Sending in rx augmentin 875/125 mg po bid x 10 days As well as given shot IM rocephin in office 1 g Advised pt in detail if any worsening sharp stabbing pain and or fever chills go to er immediately.  Stat u/s renal ordered as well to r/o kidney stone obstruction since CT abd

## 2022-01-22 NOTE — Progress Notes (Signed)
Established Patient Office Visit  Subjective:  Patient ID: Tammy Decker, female    DOB: 1970-11-11  Age: 51 y.o. MRN: [id number]  CC:  Chief Complaint  Patient presents with   Nocturia   Back Pain    HPI Tammy Decker is here today for follow up.   Pt is with acute concerns.  Completed duration of antbx from hospital on Thursday 11/6 for pyelonephritis, and started again two days later with urinary frequency, nocturia, and return of bil flank pain right > left. Also with return of urinary odor, also with nausea and decreased appetite.the back pain is constant is dull and aching.    Lab Results  Component Value Date   WBC 12.9 (H) 01/15/2022   HGB 13.6 01/15/2022   HCT 40.6 01/15/2022   MCV 107.4 (H) 01/15/2022   PLT 424.0 (H) 01/15/2022    Tachycardia: pt states heart rate still with elevation, feels it when she is walking around. Today heart rate 109.  Today's Vitals   01/22/22 1153 01/22/22 1232  Pulse: (!) 109 95  Resp: 16   Temp: 97.9 F (36.6 C)   SpO2: 97%   Weight: 234 lb 2 oz (106.2 kg)   Height: _0  (1.702 m)    Body mass index is 36.67 kg/m.    Past Medical History:  Diagnosis Date   Asthma    Complication of anesthesia    OXYGEN SOMETIMES DROPS LOW   GERD (gastroesophageal reflux disease)    Hypertension    Tachycardia 2020    Past Surgical History:  Procedure Laterality Date   BACK SURGERY     CHOLECYSTECTOMY     ESOPHAGOGASTRODUODENOSCOPY (EGD) WITH PROPOFOL N/A 06/19/2021   Procedure: ESOPHAGOGASTRODUODENOSCOPY (EGD) WITH PROPOFOL;  Surgeon: Lin Landsman, MD;  Location: [facility] ENDOSCOPY;  Service: Gastroenterology;  Laterality: N/A;   NECK SURGERY     uterine ablation     XI ROBOTIC ASSISTED VENTRAL HERNIA N/A 05/09/2020   Procedure: XI ROBOTIC ASSISTED VENTRAL HERNIA, incisional hernia;  Surgeon: Jules Husbands, MD;  Location: [facility] ORS;  Service: General;  Laterality: N/A;    Family History  Problem Relation Age of  Onset   Diabetes Mother    Diabetes Father    Heart attack Father    Ovarian cancer Maternal Grandmother        older dx maybe early 29s   Colon cancer Maternal Grandfather        in his 33's   Early death Paternal Grandfather    Heart attack Paternal Grandfather     Social History   Socioeconomic History   Marital status: Married    Spouse name: Not on file   Number of children: Not on file   Years of education: Not on file   Highest education level: Not on file  Occupational History   Not on file  Tobacco Use   Smoking status: Every Day    Packs/day: 0.15    Types: Cigarettes    Passive exposure: Past   Smokeless tobacco: Never   Tobacco comments:    down to 3 cigs per day  Vaping Use   Vaping Use: Never used  Substance and Sexual Activity   Alcohol use: Yes    Comment: 1/5 whiskey nightly   Drug use: Never   Sexual activity: Yes    Partners: Male    Birth control/protection: None, Post-menopausal  Other Topics Concern   Not on file  Social History Narrative   Not  on file   Social Determinants of Health   Financial Resource Strain: Not on file  Food Insecurity: No Food Insecurity (01/08/2022)   Hunger Vital Sign    Worried About Running Out of Food in the Last Year: Never true    Ran Out of Food in the Last Year: Never true  Transportation Needs: No Transportation Needs (01/08/2022)   PRAPARE - Hydrologist (Medical): No    Lack of Transportation (Non-Medical): No  Physical Activity: Not on file  Stress: Not on file  Social Connections: Not on file  Intimate Partner Violence: Not At Risk (01/08/2022)   Humiliation, Afraid, Rape, and Kick questionnaire    Fear of Current or Ex-Partner: No    Emotionally Abused: No    Physically Abused: No    Sexually Abused: No    Outpatient Medications Prior to Visit  Medication Sig Dispense Refill   albuterol (PROAIR HFA) 108 (90 Base) MCG/ACT inhaler Inhale 2 puffs into the lungs every 6  (six) hours as needed for wheezing or shortness of breath. 1 each 0   cyanocobalamin (VITAMIN B12) 1000 MCG tablet Take 1,000 mcg by mouth daily.     docusate sodium (COLACE) 100 MG capsule Take 2 capsules (200 mg total) by mouth 2 (two) times daily as needed for mild constipation. 60 capsule 0   folic acid (FOLVITE) 1 MG tablet Take 1 tablet (1 mg total) by mouth daily. 30 tablet 1   losartan (COZAAR) 100 MG tablet Take 1 tablet (100 mg total) by mouth daily. 90 tablet 3   metoprolol tartrate (LOPRESSOR) 25 MG tablet Take 1 tablet (25 mg total) by mouth 2 (two) times daily. 60 tablet 0   omeprazole (PRILOSEC) 40 MG capsule Take 1 capsule (40 mg total) by mouth daily. 60 capsule 0   polyethylene glycol (MIRALAX / GLYCOLAX) 17 g packet Take 17 g by mouth daily as needed for mild constipation. 14 each 0   traZODone (DESYREL) 50 MG tablet Take 1 tablet (50 mg total) by mouth at bedtime as needed for sleep. 30 tablet 3   potassium chloride SA (KLOR-CON M) 20 MEQ tablet Take 1 tablet (20 mEq total) by mouth 2 (two) times daily for 5 days. 10 tablet 0   No facility-administered medications prior to visit.    Allergies  Allergen Reactions   Ciprofloxacin Anaphylaxis   Kiwi Extract Anaphylaxis   Sulfa Antibiotics Hives      Review of Systems  Respiratory:  Negative for shortness of breath.   Cardiovascular:  Negative for chest pain and palpitations.  Gastrointestinal:  Negative for constipation and diarrhea.  Genitourinary:  Negative for dysuria, frequency and urgency.  Musculoskeletal:  Negative for myalgias.  Psychiatric/Behavioral:  Negative for depression and suicidal ideas.   All other systems reviewed and are negative.    Objective:    Physical Exam Constitutional:      Appearance: Normal appearance. She is obese.  Cardiovascular:     Rate and Rhythm: Regular rhythm. Tachycardia present.  Pulmonary:     Effort: Pulmonary effort is normal.     Breath sounds: Normal breath  sounds.  Abdominal:     General: Abdomen is flat. Bowel sounds are normal. There is distension.     Tenderness: There is abdominal tenderness in the right upper quadrant and epigastric area. There is right CVA tenderness and left CVA tenderness. There is no guarding or rebound. Negative signs include Murphy's sign and McBurney's sign.  Hernia: A hernia (mesh palpated with hardness suprapubic umbilical area) is present.  Neurological:     Mental Status: She is alert.       Pulse 95 Comment: manually after sitting 10 minutes  Temp 97.9 F (36.6 C)   Resp 16   Ht _0  (1.702 m)   Wt 234 lb 2 oz (106.2 kg)   SpO2 97%   BMI 36.67 kg/m  Wt Readings from Last 3 Encounters:  01/22/22 234 lb 2 oz (106.2 kg)  01/15/22 238 lb 8 oz (108.2 kg)  01/08/22 210 lb (95.3 kg)     Health Maintenance Due  Topic Date Due   COVID-19 Vaccine (1) Never done   PAP SMEAR-Modifier  Never done   COLONOSCOPY (Pts 45-80yr Insurance coverage will need to be confirmed)  Never done    There are no preventive care reminders to display for this patient.  Lab Results  Component Value Date   TSH 3.04 12/19/2021   Lab Results  Component Value Date   WBC 12.9 (H) 01/15/2022   HGB 13.6 01/15/2022   HCT 40.6 01/15/2022   MCV 107.4 (H) 01/15/2022   PLT 424.0 (H) 01/15/2022   Lab Results  Component Value Date   NA 136 01/15/2022   K 3.2 (L) 01/15/2022   CO2 28 01/15/2022   GLUCOSE 150 (H) 01/15/2022   BUN 4 (L) 01/15/2022   CREATININE 0.53 01/15/2022   BILITOT 1.4 (H) 01/12/2022   ALKPHOS 112 01/12/2022   AST 71 (H) 01/12/2022   ALT 74 (H) 01/12/2022   PROT 6.6 01/12/2022   ALBUMIN 2.9 (L) 01/12/2022   CALCIUM 9.2 01/15/2022   ANIONGAP 8 01/12/2022   EGFR 113 09/11/2020   GFR 108.31 01/15/2022   Lab Results  Component Value Date   CHOL 217 (H) 09/11/2020   Lab Results  Component Value Date   HDL 81 09/11/2020   Lab Results  Component Value Date   LDLCALC 121 (H) 09/11/2020    Lab Results  Component Value Date   TRIG 77 01/08/2022   Lab Results  Component Value Date   CHOLHDL 2.7 09/11/2020   Lab Results  Component Value Date   HGBA1C 6.1 12/19/2021       Assessment & Plan:   Problem List Items Addressed This Visit       Digestive   Alcoholic pancreatitis    Resolving Repeat lipase       Relevant Orders   Lipase     Genitourinary   Renal cyst, acquired, left   Relevant Medications   amoxicillin-clavulanate (AUGMENTIN) 875-125 MG tablet   Other Relevant Orders   CBC with Differential   Comprehensive metabolic panel   [place]     Other   Heart rate fast    Slight improvement with metoprolol , continue as prescribed.      Bilateral flank pain    Concern as just completed duration of therapy antbx for pyelonephritis.  Sending in rx augmentin 875/125 mg po bid x 10 days As well as given shot IM rocephin in office 1 g Advised pt in detail if any worsening sharp stabbing pain and or fever chills go to er immediately.  Stat u/s renal ordered as well to r/o kidney stone obstruction since CT abd      Relevant Medications   amoxicillin-clavulanate (AUGMENTIN) 875-125 MG tablet   Other Relevant Orders   [place]   H/O pyelonephritis   Relevant Medications   amoxicillin-clavulanate (AUGMENTIN) 875-125 MG  tablet   Other Relevant Orders   CBC with Differential   Comprehensive metabolic panel   Urine Culture   Urinalysis, Routine w reflex microscopic   Ambulatory referral to Urology   US RENAL   Proteinuria - Primary   Relevant Medications   amoxicillin-clavulanate (AUGMENTIN) 875-125 MG tablet   Other Relevant Orders   CBC with Differential   Comprehensive metabolic panel   Ambulatory referral to Urology   US RENAL   Has recently quit alcohol use    Meds ordered this encounter  Medications   DISCONTD: cefTRIAXone (ROCEPHIN) 1 g injection    Sig: Inject 1 g into the muscle once for 1 dose.    Dispense:  1 each     Refill:  0    Order Specific Question:   Supervising Provider    Answer:   BEDSOLE, AMY E [[id number]]   amoxicillin-clavulanate (AUGMENTIN) 875-125 MG tablet    Sig: Take 1 tablet by mouth 2 (two) times daily.    Dispense:  20 tablet    Refill:  0    Order Specific Question:   Supervising Provider    Answer:   BEDSOLE, AMY E [[id number]]   cefTRIAXone (ROCEPHIN) injection 1 g    Follow-up: Return in about 2 weeks (around 02/05/2022) for f/u pyelenephritis.    Eugenia Pancoast, FNP

## 2022-01-22 NOTE — Assessment & Plan Note (Signed)
Resolving Repeat lipase

## 2022-01-22 NOTE — Patient Instructions (Signed)
  I have ordered imaging for you at [facility] outpatient diagnostic center (ultrasound kidneys). This order has been sent over for you electronically.   Please call [phone] to schedule this appointment.   Regards,   Mort Sawyers FNP-C

## 2022-01-22 NOTE — Plan of Care (Signed)
Tammy Decker agrees to the following Care Plan and to this therapist electronically signing on her behalf: Problem: Alcohol and depression/anxiety Goal:  [name] will continue to abstain from alcohol per self-report and random breathalyzer if indicated.  Outcome: Not Applicable Goal: [name] will report a decrease in her feelings of depression and anxiety as evidenced by her PHQ-9 and GAD-7 both being a 4 or less with [name] resuming exercise per self-report.  Outcome: Not Applicable Intervention: Therapist will assist [name] in identifying and avoiding triggers to drink alcohol  Note: reviewed Intervention: Therapist will assist [name] in identifying and changing thoughts and behaviors that contribute to her depression and anxiety.  Note: reviewed
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
## 2022-01-22 NOTE — Assessment & Plan Note (Signed)
Slight improvement with metoprolol , continue as prescribed.

## 2022-01-23 LAB — URINE CULTURE
MICRO NUMBER:: 14218923
SPECIMEN QUALITY:: ADEQUATE

## 2022-01-28 ENCOUNTER — Ambulatory Visit: Payer: PRIVATE HEALTH INSURANCE

## 2022-01-29 ENCOUNTER — Telehealth (HOSPITAL_COMMUNITY): Payer: Self-pay | Admitting: Licensed Clinical Social Worker

## 2022-01-29 ENCOUNTER — Ambulatory Visit (HOSPITAL_COMMUNITY): Payer: PRIVATE HEALTH INSURANCE | Admitting: Licensed Clinical Social Worker

## 2022-01-29 ENCOUNTER — Encounter (HOSPITAL_COMMUNITY): Payer: Self-pay | Admitting: Licensed Clinical Social Worker

## 2022-01-29 NOTE — Telephone Encounter (Signed)
The therapist attempts to reach [facility] when she does not show for her scheduled appointment at 4 p.m. Her number rings over ten times without going to voicemail with the therapist finally receiving a message that his call cannot be completed as dialed. The therapist confirms that the number called matches Karimah's number in the system.   Myrna Blazer, MA, LCSW, [facility], LCAS 01/29/2022

## 2022-01-29 NOTE — Progress Notes (Signed)
Wbc stable. Not increased. Platelets stable.  Liver function from two weeks ago stable, slightly decreased however alk phos trending upwards. Will continue to monitor.   Lipase trending upwards but very mildly.  How is pain today? Any increase or decrease in pain over the holiday weekend?  Urine negative for UTI.   Do you have kidney ultrasound scheduled?

## 2022-01-31 ENCOUNTER — Ambulatory Visit (INDEPENDENT_AMBULATORY_CARE_PROVIDER_SITE_OTHER): Payer: PRIVATE HEALTH INSURANCE | Admitting: Urology

## 2022-01-31 ENCOUNTER — Encounter: Payer: Self-pay | Admitting: Urology

## 2022-01-31 VITALS — BP 127/81 | HR 118 | Ht 67.5 in | Wt 225.0 lb

## 2022-01-31 DIAGNOSIS — N12 Tubulo-interstitial nephritis, not specified as acute or chronic: Secondary | ICD-10-CM

## 2022-01-31 DIAGNOSIS — R82998 Other abnormal findings in urine: Secondary | ICD-10-CM

## 2022-01-31 DIAGNOSIS — M549 Dorsalgia, unspecified: Secondary | ICD-10-CM | POA: Diagnosis not present

## 2022-01-31 DIAGNOSIS — N390 Urinary tract infection, site not specified: Secondary | ICD-10-CM

## 2022-01-31 MED ORDER — NITROFURANTOIN MONOHYD MACRO 100 MG PO CAPS: 100.0000 mg | ORAL_CAPSULE | Freq: Every day | ORAL | 0 refills | Status: DC

## 2022-01-31 NOTE — Progress Notes (Signed)
01/31/22 3:34 PM   [name] Orlie Dakin 03/28/1970 [id number]  CC: Recurrent UTI, possible recent pyelonephritis, urinary symptoms  HPI: 51 year old female with history of alcohol abuse who was admitted on 11/7-11/11/23 for pancreatitis/duodenitis and possible right-sided pyelonephritis.  Her primary complaint at that time was nausea/vomiting and abdominal/back pain.  Urine culture ultimately grew >100k Citrobacter.  She had recurrence of some urinary symptoms on 01/22/2022 and was seen by PCP and urinalysis was contaminated with skin cells but benign, and urine culture was negative, and she was treated with another course of Augmentin.  She has not had any alcohol since 01/08/2022.  She does continue to have some back pain as well as some foul-smelling urine, and a sensation of straining/leaning forward to void.  She does think she will leak a little bit without sensation as well.   PMH: Past Medical History:  Diagnosis Date   Asthma    Complication of anesthesia    OXYGEN SOMETIMES DROPS LOW   GERD (gastroesophageal reflux disease)    Hypertension    Pyelonephritis    Tachycardia 2020    Surgical History: Past Surgical History:  Procedure Laterality Date   BACK SURGERY     CHOLECYSTECTOMY     ESOPHAGOGASTRODUODENOSCOPY (EGD) WITH PROPOFOL N/A 06/19/2021   Procedure: ESOPHAGOGASTRODUODENOSCOPY (EGD) WITH PROPOFOL;  Surgeon: Toney Reil, MD;  Location: [facility];  Service: Gastroenterology;  Laterality: N/A;   NECK SURGERY     uterine ablation     XI ROBOTIC ASSISTED VENTRAL HERNIA N/A 05/09/2020   Procedure: XI ROBOTIC ASSISTED VENTRAL HERNIA, incisional hernia;  Surgeon: Leafy Ro, MD;  Location: [facility] ORS;  Service: General;  Laterality: N/A;     Family History: Family History  Problem Relation Age of Onset   Diabetes Mother    Diabetes Father    Heart attack Father    Ovarian cancer Maternal Grandmother        older dx maybe early 39s   Colon cancer  Maternal Grandfather        in his 66's   Early death Paternal Grandfather    Heart attack Paternal Grandfather     Social History:  reports that she has been smoking cigarettes. She has been smoking an average of .15 packs per day. She has been exposed to tobacco smoke. She has never used smokeless tobacco. She reports current alcohol use. She reports that she does not use drugs.  Physical Exam: BP 127/81 (BP Location: Left Arm, Patient Position: Sitting, Cuff Size: Large)   Pulse (!) 118   Ht 5' 7.5" (1.715 m)   Wt 225 lb (102.1 kg)   BMI 34.72 kg/m    Constitutional:  Alert and oriented, No acute distress. Cardiovascular: No clubbing, cyanosis, or edema. Respiratory: Normal respiratory effort, no increased work of breathing. GI: Abdomen is soft, nontender, nondistended, no abdominal masses  Laboratory Data: Reviewed  Pertinent Imaging: I have personally viewed and interpreted the CT from 01/08/2022.  Acute pancreatitis and duodenitis.  Very subtle 2 cm area of some decreased density in the right upper pole, unclear if this represents pyelonephritis or simply artifact.  Not significantly changed from prior CT from March 2023.  No hydronephrosis, small nonobstructive left renal stones.  Assessment & Plan:   51 year old female recently hospitalized for acute pancreatitis/duodenitis and possible right-sided pyelonephritis.  From my review of the notes, imaging, and lab work, I think this was primarily related to pancreatitis/duodenitis.  A repeat urine culture on 01/22/2022 was negative, and  UA at that time was benign.  She is still having some irritative urinary symptoms of unclear etiology.  Other possible etiologies could include renal abscess, but I think that is less likely based on her clinical presentation and prior imaging.  I recommended starting with a trial of cranberry tablets twice daily and 30 days of low-dose Macrobid daily for UTI prevention.  Could consider an OAB  medication in the future potentially if still having some leakage, and/or trial of topical estrogen cream.  She has a renal ultrasound scheduled tomorrow for follow-up of the subtle abnormality seen on CT from early November, and I will follow-up those results as well  RTC 4 to 6 weeks symptom check  Legrand Rams, MD 01/31/2022  [facility] [address], [address] [place], [place] [address] [phone]

## 2022-02-05 ENCOUNTER — Ambulatory Visit: Payer: No Typology Code available for payment source | Admitting: Family

## 2022-02-07 ENCOUNTER — Ambulatory Visit: Payer: PRIVATE HEALTH INSURANCE | Admitting: Interventional Cardiology

## 2022-02-09 ENCOUNTER — Other Ambulatory Visit: Payer: Self-pay | Admitting: Family

## 2022-02-09 DIAGNOSIS — R000 Tachycardia, unspecified: Secondary | ICD-10-CM

## 2022-02-09 NOTE — Telephone Encounter (Signed)
I see pt cancelled cardiologist appt and her f/u with me. I am concerned as her heart rate was very high. I am hesitant to fill her metoprolol as she needs an office visit. What is she planning to do? What have her heart rate been running?

## 2022-02-10 ENCOUNTER — Other Ambulatory Visit: Payer: Self-pay | Admitting: Family

## 2022-02-10 DIAGNOSIS — R000 Tachycardia, unspecified: Secondary | ICD-10-CM

## 2022-02-10 MED ORDER — METOPROLOL TARTRATE 25 MG PO TABS: 25.0000 mg | ORAL_TABLET | Freq: Two times a day (BID) | ORAL | 0 refills | Status: DC

## 2022-03-01 ENCOUNTER — Telehealth: Payer: PRIVATE HEALTH INSURANCE | Admitting: Physician Assistant

## 2022-03-01 DIAGNOSIS — R6889 Other general symptoms and signs: Secondary | ICD-10-CM | POA: Diagnosis not present

## 2022-03-01 MED ORDER — BENZONATATE 100 MG PO CAPS: 100.0000 mg | ORAL_CAPSULE | Freq: Three times a day (TID) | ORAL | 0 refills | Status: DC | PRN

## 2022-03-01 MED ORDER — FLUTICASONE PROPIONATE 50 MCG/ACT NA SUSP: 2.0000 | Freq: Every day | NASAL | 0 refills | Status: DC

## 2022-03-01 NOTE — Progress Notes (Signed)
E visit for Flu like symptoms   We are sorry that you are not feeling well.  Here is how we plan to help! Based on what you have shared with me it looks like you may have flu-like symptoms that should be watched but do not seem to indicate anti-viral treatment.  Influenza or "the flu" is   an infection caused by a respiratory virus. The flu virus is highly contagious and persons who did not receive their yearly flu vaccination may "catch" the flu from close contact.  We have anti-viral medications to treat the viruses that cause this infection. They are not a "cure" and only shorten the course of the infection. These prescriptions are most effective when they are given within the first 2 days of "flu" symptoms. Antiviral medication are indicated if you have a high risk of complications from the flu. You should  also consider an antiviral medication if you are in close contact with someone who is at risk. These medications can help patients avoid complications from the flu  but have side effects that you should know. Possible side effects from Tamiflu or oseltamivir include nausea, vomiting, diarrhea, dizziness, headaches, eye redness, sleep problems or other respiratory symptoms. You should not take Tamiflu if you have an allergy to oseltamivir or any to the ingredients in Tamiflu.  Based upon your symptoms and potential risk factors I recommend that you follow the flu symptoms recommendation that I have listed below.  This is an infection that is most likely caused by a virus. There are no specific treatments other than to help you with the symptoms until the infection runs its course.  We are sorry you are not feeling well.  Here is how we plan to help!  For nasal congestion, you may use an oral decongestants such as Mucinex D or if you have glaucoma or high blood pressure use plain Mucinex.  Saline nasal spray or nasal drops can help and can safely be used as often as needed for congestion.  For  your congestion, I have prescribed Fluticasone nasal spray one spray in each nostril twice a day  If you do not have a history of heart disease, hypertension, diabetes or thyroid disease, prostate/bladder issues or glaucoma, you may also use Sudafed to treat nasal congestion.  It is highly recommended that you consult with a pharmacist or your primary care physician to ensure this medication is safe for you to take.     If you have a cough, you may use cough suppressants such as Delsym and Robitussin.  If you have glaucoma or high blood pressure, you can also use Coricidin HBP.   For cough I have prescribed for you A prescription cough medication called Tessalon Perles 100 mg. You may take 1-2 capsules every 8 hours as needed for cough  If you have a sore or scratchy throat, use a saltwater gargle-  to  teaspoon of salt dissolved in a 4-ounce to 8-ounce glass of warm water.  Gargle the solution for approximately 15-30 seconds and then spit.  It is important not to swallow the solution.  You can also use throat lozenges/cough drops and Chloraseptic spray to help with throat pain or discomfort.  Warm or cold liquids can also be helpful in relieving throat pain.  For headache, pain or general discomfort, you can use Ibuprofen or Tylenol as directed.   Some authorities believe that zinc sprays or the use of Echinacea may shorten the course of your symptoms.     ANYONE WHO HAS FLU SYMPTOMS SHOULD: Stay home. The flu is highly contagious and going out or to work exposes others! Be sure to drink plenty of fluids. Water is fine as well as fruit juices, sodas and electrolyte beverages. You may want to stay away from caffeine or alcohol. If you are nauseated, try taking small sips of liquids. How do you know if you are getting enough fluid? Your urine should be a pale yellow or almost colorless. Get rest. Taking a steamy shower or using a humidifier may help nasal congestion and ease sore throat pain. Using a  saline nasal spray works much the same way. Cough drops, hard candies and sore throat lozenges may ease your cough. Line up a caregiver. Have someone check on you regularly.   GET HELP RIGHT AWAY IF: You cannot keep down liquids or your medications. You become short of breath Your fell like you are going to pass out or loose consciousness. Your symptoms persist after you have completed your treatment plan MAKE SURE YOU  Understand these instructions. Will watch your condition. Will get help right away if you are not doing well or get worse.  Your e-visit answers were reviewed by a board certified advanced clinical practitioner to complete your personal care plan.  Depending on the condition, your plan could have included both over the counter or prescription medications.  If there is a problem please reply  once you have received a response from your provider.  Your safety is important to us.  If you have drug allergies check your prescription carefully.    You can use MyChart to ask questions about today's visit, request a non-urgent call back, or ask for a work or school excuse for 24 hours related to this e-Visit. If it has been greater than 24 hours you will need to follow up with your provider, or enter a new e-Visit to address those concerns.  You will get an e-mail in the next two days asking about your experience.  I hope that your e-visit has been valuable and will speed your recovery. Thank you for using e-visits.  I have spent 5 minutes in review of e-visit questionnaire, review and updating patient chart, medical decision making and response to patient.   Kimbly Eanes M Bert Givans, PA-C  

## 2022-03-06 NOTE — Progress Notes (Deleted)
Cardiology Office Note:    Date:  03/06/2022   ID:  Tammy Decker, DOB 16-Oct-1970, MRN [id number]  PCP:  Eugenia Pancoast, [place] Providers Cardiologist:  None    Referring MD: Eugenia Pancoast, FNP    History of Present Illness:    Tammy Decker is a 52 y.o. female with a hx of HTN, asthma, GERD, tobacco abuse, and obesity who was referred by Eugenia Pancoast, FNP for further evaluation of palpitations.  Patient seen by Eugenia Pancoast, FNP on 01/22/22. Note reviewed. Had been placed on metop for tachycardia and is now referred to Cardiology for further evaluation.  Today, ***  Past Medical History:  Diagnosis Date   Asthma    Complication of anesthesia    OXYGEN SOMETIMES DROPS LOW   GERD (gastroesophageal reflux disease)    Hypertension    Pyelonephritis    Tachycardia 2020    Past Surgical History:  Procedure Laterality Date   BACK SURGERY     CHOLECYSTECTOMY     ESOPHAGOGASTRODUODENOSCOPY (EGD) WITH PROPOFOL N/A 06/19/2021   Procedure: ESOPHAGOGASTRODUODENOSCOPY (EGD) WITH PROPOFOL;  Surgeon: Lin Landsman, MD;  Location: [facility];  Service: Gastroenterology;  Laterality: N/A;   NECK SURGERY     uterine ablation     XI ROBOTIC ASSISTED VENTRAL HERNIA N/A 05/09/2020   Procedure: XI ROBOTIC ASSISTED VENTRAL HERNIA, incisional hernia;  Surgeon: Jules Husbands, MD;  Location: [facility] ORS;  Service: General;  Laterality: N/A;    Current Medications: No outpatient medications have been marked as taking for the 03/12/22 encounter (Appointment) with Freada Bergeron, MD.     Allergies:   Ciprofloxacin, Kiwi extract, and Sulfa antibiotics   Social History   Socioeconomic History   Marital status: Married    Spouse name: Not on file   Number of children: Not on file   Years of education: Not on file   Highest education level: Not on file  Occupational History   Not on file  Tobacco Use   Smoking status: Every Day    Packs/day:  0.15    Types: Cigarettes    Passive exposure: Past   Smokeless tobacco: Never   Tobacco comments:    down to 3 cigs per day  Vaping Use   Vaping Use: Never used  Substance and Sexual Activity   Alcohol use: Yes    Comment: 1/5 whiskey nightly   Drug use: Never   Sexual activity: Yes    Partners: Male    Birth control/protection: None, Post-menopausal  Other Topics Concern   Not on file  Social History Narrative   Not on file   Social Determinants of Health   Financial Resource Strain: Not on file  Food Insecurity: No Food Insecurity (01/08/2022)   Hunger Vital Sign    Worried About Running Out of Food in the Last Year: Never true    Ran Out of Food in the Last Year: Never true  Transportation Needs: No Transportation Needs (01/08/2022)   PRAPARE - Hydrologist (Medical): No    Lack of Transportation (Non-Medical): No  Physical Activity: Not on file  Stress: Not on file  Social Connections: Not on file     Family History: The patient's ***family history includes Colon cancer in her maternal grandfather; Diabetes in her father and mother; Early death in her paternal grandfather; Heart attack in her father and paternal grandfather; Ovarian cancer in her maternal grandmother.  ROS:  Please see the history of present illness.    *** All other systems reviewed and are negative.  EKGs/Labs/Other Studies Reviewed:    The following studies were reviewed today: ***  EKG:  EKG is *** ordered today.  The ekg ordered today demonstrates ***  Recent Labs: 12/19/2021: TSH 3.04 01/15/2022: Magnesium 1.6 01/22/2022: ALT 51; BUN 8; Creatinine, Ser 0.64; Hemoglobin 14.4; Platelets 438.0; Potassium 4.2; Sodium 136  Recent Lipid Panel    Component Value Date/Time   CHOL 217 (H) 09/11/2020 1100   TRIG 77 01/08/2022 0747   HDL 81 09/11/2020 1100   CHOLHDL 2.7 09/11/2020 1100   LDLCALC 121 (H) 09/11/2020 1100     Risk Assessment/Calculations:    {Does this patient have ATRIAL FIBRILLATION?[phone]}  No BP recorded.  {Refresh Note OR Click here to enter BP  :1}***         Physical Exam:    VS:  There were no vitals taken for this visit.    Wt Readings from Last 3 Encounters:  01/31/22 225 lb (102.1 kg)  01/22/22 234 lb 2 oz (106.2 kg)  01/15/22 238 lb 8 oz (108.2 kg)     GEN: *** Well nourished, well developed in no acute distress HEENT: Normal NECK: No JVD; No carotid bruits LYMPHATICS: No lymphadenopathy CARDIAC: ***RRR, no murmurs, rubs, gallops RESPIRATORY:  Clear to auscultation without rales, wheezing or rhonchi  ABDOMEN: Soft, non-tender, non-distended MUSCULOSKELETAL:  No edema; No deformity  SKIN: Warm and dry NEUROLOGIC:  Alert and oriented x 3 PSYCHIATRIC:  Normal affect   ASSESSMENT:    No diagnosis found. PLAN:    In order of problems listed above:  #Tachycardia: -Check zio monitor -Continue metop 25mg  BID  #HTN: -Continue metop 25mg  BID -Continue losartan 100mg  daily      {Are you ordering a CV Procedure (e.g. stress test, cath, DCCV, TEE, etc)?   Press F2        :[id number]}    Medication Adjustments/Labs and Tests Ordered: Current medicines are reviewed at length with the patient today.  Concerns regarding medicines are outlined above.  No orders of the defined types were placed in this encounter.  No orders of the defined types were placed in this encounter.   There are no Patient Instructions on file for this visit.   Signed, Freada Bergeron, MD  03/06/2022 7:55 PM    [place]

## 2022-03-11 NOTE — Progress Notes (Incomplete)
Cardiology Office Note:    Date:  03/11/2022   ID:  Lannette Donath, DOB 1970/12/25, MRN [id number]  PCP:  Eugenia Pancoast, [place] Providers Cardiologist:  None    Referring MD: Eugenia Pancoast, FNP    History of Present Illness:    Besty Cebollero is a 52 y.o. female with a hx of HTN, asthma, GERD, tobacco abuse, and obesity who was referred by Eugenia Pancoast, FNP for further evaluation of palpitations.  Patient seen by Eugenia Pancoast, FNP on 01/22/22. Note reviewed. Had been placed on metop for tachycardia and is now referred to Cardiology for further evaluation.  Today, the patient states that ***  She denies any palpitations, chest pain, shortness of breath, or peripheral edema. No lightheadedness, headaches, syncope, orthopnea, or PND.   Past Medical History:  Diagnosis Date   Asthma    Complication of anesthesia    OXYGEN SOMETIMES DROPS LOW   GERD (gastroesophageal reflux disease)    Hypertension    Pyelonephritis    Tachycardia 2020    Past Surgical History:  Procedure Laterality Date   BACK SURGERY     CHOLECYSTECTOMY     ESOPHAGOGASTRODUODENOSCOPY (EGD) WITH PROPOFOL N/A 06/19/2021   Procedure: ESOPHAGOGASTRODUODENOSCOPY (EGD) WITH PROPOFOL;  Surgeon: Lin Landsman, MD;  Location: [facility];  Service: Gastroenterology;  Laterality: N/A;   NECK SURGERY     uterine ablation     XI ROBOTIC ASSISTED VENTRAL HERNIA N/A 05/09/2020   Procedure: XI ROBOTIC ASSISTED VENTRAL HERNIA, incisional hernia;  Surgeon: Jules Husbands, MD;  Location: [facility];  Service: General;  Laterality: N/A;    Current Medications: No outpatient medications have been marked as taking for the 03/12/22 encounter (Appointment) with Freada Bergeron, MD.     Allergies:   Ciprofloxacin, Kiwi extract, and Sulfa antibiotics   Social History   Socioeconomic History   Marital status: Married    Spouse name: Not on file   Number of children: Not on file    Years of education: Not on file   Highest education level: Not on file  Occupational History   Not on file  Tobacco Use   Smoking status: Every Day    Packs/day: 0.15    Types: Cigarettes    Passive exposure: Past   Smokeless tobacco: Never   Tobacco comments:    down to 3 cigs per day  Vaping Use   Vaping Use: Never used  Substance and Sexual Activity   Alcohol use: Yes    Comment: 1/5 whiskey nightly   Drug use: Never   Sexual activity: Yes    Partners: Male    Birth control/protection: None, Post-menopausal  Other Topics Concern   Not on file  Social History Narrative   Not on file   Social Determinants of Health   Financial Resource Strain: Not on file  Food Insecurity: No Food Insecurity (01/08/2022)   Hunger Vital Sign    Worried About Running Out of Food in the Last Year: Never true    Ran Out of Food in the Last Year: Never true  Transportation Needs: No Transportation Needs (01/08/2022)   PRAPARE - Hydrologist (Medical): No    Lack of Transportation (Non-Medical): No  Physical Activity: Not on file  Stress: Not on file  Social Connections: Not on file     Family History: The patient's family history includes Colon cancer in her maternal grandfather; Diabetes in her father and  mother; Early death in her paternal grandfather; Heart attack in her father and paternal grandfather; Ovarian cancer in her maternal grandmother.  ROS:   Review of Systems  Constitutional:  Negative for chills and fever.  HENT:  Negative for nosebleeds and tinnitus.   Eyes:  Negative for blurred vision and pain.  Respiratory:  Negative for cough, hemoptysis, shortness of breath and stridor.   Cardiovascular:  Negative for chest pain, palpitations, orthopnea, claudication, leg swelling and PND.  Gastrointestinal:  Negative for blood in stool, diarrhea, nausea and vomiting.  Genitourinary:  Negative for dysuria and hematuria.  Musculoskeletal:  Negative  for falls.  Neurological:  Negative for dizziness, loss of consciousness and headaches.  Psychiatric/Behavioral:  Negative for depression, hallucinations and substance abuse. The patient does not have insomnia.      EKGs/Labs/Other Studies Reviewed:    The following studies were reviewed today:  CT Abdomen/Pelvis  01/08/2022: FINDINGS: Lower chest: Heart is enlarged in size. There are small linear densities in both lower lung fields suggesting minimal scarring or subsegmental atelectasis.  IMPRESSION: There is stranding in the fat planes adjacent to the head of the pancreas and duodenum suggesting acute pancreatitis and possibly duodenitis. Please correlate with laboratory findings. There is no evidence of focal pancreatic necrosis.   There is no evidence of intestinal obstruction or pneumoperitoneum. There is no hydronephrosis.   There is 2 cm area of subtle decreased density in the posterior upper pole of left kidney which may be a partial volume averaging artifact or suggest early changes of pyelonephritis.   Enlarged fatty liver. Possible small nonobstructing left renal stones. Possible small subcentimeter right renal cysts. Aortic arteriosclerosis. There are low-density structures in both adnexal regions in pelvis, possibly follicles or functional cysts in the ovaries.   Other findings as described in the body of the report.  EKG:  EKG is personally reviewed. 03/12/2022: *** 01/08/2022 (ED):  Sinus rhythm, QTc 489, possible left atrial enlargement, poor R wave progression   Recent Labs: 12/19/2021: TSH 3.04 01/15/2022: Magnesium 1.6 01/22/2022: ALT 51; BUN 8; Creatinine, Ser 0.64; Hemoglobin 14.4; Platelets 438.0; Potassium 4.2; Sodium 136   Recent Lipid Panel    Component Value Date/Time   CHOL 217 (H) 09/11/2020 1100   TRIG 77 01/08/2022 0747   HDL 81 09/11/2020 1100   CHOLHDL 2.7 09/11/2020 1100   LDLCALC 121 (H) 09/11/2020 1100     Risk  Assessment/Calculations:   {Does this patient have ATRIAL FIBRILLATION?[phone]}  No BP recorded.  {Refresh Note OR Click here to enter BP  :1}***         Physical Exam:    VS:  There were no vitals taken for this visit.    Wt Readings from Last 3 Encounters:  01/31/22 225 lb (102.1 kg)  01/22/22 234 lb 2 oz (106.2 kg)  01/15/22 238 lb 8 oz (108.2 kg)     GEN: Well nourished, well developed in no acute distress HEENT: Normal NECK: No JVD; No carotid bruits LYMPHATICS: No lymphadenopathy CARDIAC: ***RRR, no murmurs, rubs, gallops RESPIRATORY:  Clear to auscultation without rales, wheezing or rhonchi  ABDOMEN: Soft, non-tender, non-distended MUSCULOSKELETAL:  No edema; No deformity  SKIN: Warm and dry NEUROLOGIC:  Alert and oriented x 3 PSYCHIATRIC:  Normal affect   ASSESSMENT:    No diagnosis found. PLAN:    In order of problems listed above:  #Tachycardia: -Check zio monitor -Continue metop 25mg  BID  #HTN: -Continue metop 25mg  BID -Continue losartan 100mg  daily      {  Are you ordering a CV Procedure (e.g. stress test, cath, DCCV, TEE, etc)?   Press F2        :[id number]}  Follow-up:  Medication Adjustments/Labs and Tests Ordered: Current medicines are reviewed at length with the patient today.  Concerns regarding medicines are outlined above.   No orders of the defined types were placed in this encounter.  No orders of the defined types were placed in this encounter.  There are no Patient Instructions on file for this visit.   I,Mathew Stumpf,acting as a Neurosurgeon for Meriam Sprague, MD.,have documented all relevant documentation on the behalf of Meriam Sprague, MD,as directed by  Meriam Sprague, MD while in the presence of Meriam Sprague, MD.  ***  Guadalupe Maple  03/11/2022 10:43 AM    [facility]

## 2022-03-12 ENCOUNTER — Ambulatory Visit: Payer: PRIVATE HEALTH INSURANCE | Admitting: Cardiology

## 2022-03-14 ENCOUNTER — Ambulatory Visit: Payer: PRIVATE HEALTH INSURANCE | Admitting: Urology

## 2022-03-15 ENCOUNTER — Encounter: Payer: Self-pay | Admitting: Urology

## 2022-03-20 ENCOUNTER — Encounter: Payer: Self-pay | Admitting: Family

## 2022-03-20 NOTE — Telephone Encounter (Signed)
Patient has a follow up appt scheduled for March 22, 2022 with you. Would you like for me to still reach out to her?

## 2022-03-21 ENCOUNTER — Other Ambulatory Visit: Payer: Self-pay | Admitting: Family

## 2022-03-21 DIAGNOSIS — R000 Tachycardia, unspecified: Secondary | ICD-10-CM

## 2022-03-21 MED ORDER — METOPROLOL TARTRATE 25 MG PO TABS: 25.0000 mg | ORAL_TABLET | Freq: Two times a day (BID) | ORAL | 0 refills | Status: DC

## 2022-03-22 ENCOUNTER — Ambulatory Visit: Payer: PRIVATE HEALTH INSURANCE | Admitting: Family

## 2022-03-26 NOTE — Telephone Encounter (Signed)
Looks like pt cancelled appt.  She needs to schedule an appointment to be seen priro to refill.

## 2022-04-01 ENCOUNTER — Emergency Department: Payer: PRIVATE HEALTH INSURANCE

## 2022-04-01 ENCOUNTER — Emergency Department
Admission: EM | Admit: 2022-04-01 | Discharge: 2022-04-02 | Disposition: A | Discharge status: Home / Self Care | Payer: PRIVATE HEALTH INSURANCE | Attending: Emergency Medicine | Admitting: Emergency Medicine

## 2022-04-01 DIAGNOSIS — R748 Abnormal levels of other serum enzymes: Secondary | ICD-10-CM | POA: Insufficient documentation

## 2022-04-01 DIAGNOSIS — D72829 Elevated white blood cell count, unspecified: Secondary | ICD-10-CM | POA: Diagnosis not present

## 2022-04-01 DIAGNOSIS — K858 Other acute pancreatitis without necrosis or infection: Secondary | ICD-10-CM | POA: Insufficient documentation

## 2022-04-01 DIAGNOSIS — R1033 Periumbilical pain: Secondary | ICD-10-CM | POA: Diagnosis present

## 2022-04-01 LAB — COMPREHENSIVE METABOLIC PANEL
ALT: 92 U/L — ABNORMAL HIGH (ref 0–44)
AST: 124 U/L — ABNORMAL HIGH (ref 15–41)
Albumin: 4.1 g/dL (ref 3.5–5.0)
Alkaline Phosphatase: 110 U/L (ref 38–126)
Anion gap: 14 (ref 5–15)
BUN: 6 mg/dL (ref 6–20)
CO2: 22 mmol/L (ref 22–32)
Calcium: 9.5 mg/dL (ref 8.9–10.3)
Chloride: 97 mmol/L — ABNORMAL LOW (ref 98–111)
Creatinine, Ser: 0.53 mg/dL (ref 0.44–1.00)
GFR, Estimated: >60 mL/min (ref >60)
Glucose, Bld: 126 mg/dL — ABNORMAL HIGH (ref 70–99)
Potassium: 3.5 mmol/L (ref 3.5–5.1)
Sodium: 133 mmol/L — ABNORMAL LOW (ref 135–145)
Total Bilirubin: 1 mg/dL (ref 0.3–1.2)
Total Protein: 8.9 g/dL — ABNORMAL HIGH (ref 6.5–8.1)

## 2022-04-01 LAB — CBC
HCT: 48.3 % — ABNORMAL HIGH (ref 36.0–46.0)
Hemoglobin: 16.4 g/dL — ABNORMAL HIGH (ref 12.0–15.0)
MCH: 34.1 pg — ABNORMAL HIGH (ref 26.0–34.0)
MCHC: 34 g/dL (ref 30.0–36.0)
MCV: 100.4 fL — ABNORMAL HIGH (ref 80.0–100.0)
Platelets: 299 10*3/uL (ref 150–400)
RBC: 4.81 MIL/uL (ref 3.87–5.11)
RDW: 13.7 % (ref 11.5–15.5)
WBC: 12 10*3/uL — ABNORMAL HIGH (ref 4.0–10.5)
nRBC: 0 % (ref 0.0–0.2)

## 2022-04-01 LAB — LIPASE, BLOOD: Lipase: 115 U/L — ABNORMAL HIGH (ref 11–51)

## 2022-04-01 MED ORDER — LACTATED RINGERS IV BOLUS
2000.0000 mL | Freq: Once | INTRAVENOUS | Status: AC
  Administered 2022-04-01: 2000 mL via INTRAVENOUS

## 2022-04-01 MED ORDER — IOHEXOL 300 MG/ML  SOLN
100.0000 mL | Freq: Once | INTRAMUSCULAR | Status: AC | PRN
  Administered 2022-04-01: 100 mL via INTRAVENOUS

## 2022-04-01 MED ORDER — ONDANSETRON HCL 4 MG/2ML IJ SOLN
4.0000 mg | Freq: Once | INTRAMUSCULAR | Status: AC | PRN
  Administered 2022-04-01: 4 mg via INTRAVENOUS
  Filled 2022-04-01: qty 2

## 2022-04-01 MED ORDER — MORPHINE SULFATE (PF) 4 MG/ML IV SOLN
6.0000 mg | Freq: Once | INTRAVENOUS | Status: AC
  Administered 2022-04-01: 6 mg via INTRAVENOUS
  Filled 2022-04-01: qty 2

## 2022-04-01 MED ORDER — ONDANSETRON HCL 4 MG/2ML IJ SOLN
4.0000 mg | Freq: Once | INTRAMUSCULAR | Status: AC
  Administered 2022-04-01: 4 mg via INTRAVENOUS
  Filled 2022-04-01: qty 2

## 2022-04-01 NOTE — ED Triage Notes (Signed)
Pt in with sharp R abdominal pain, radiates to R flank. Pt states it has been ongoing all day. Arrives vomiting heavily during triage, onset of emesis 1hr PTA. Denies any blood in vomit, denies diarrhea.

## 2022-04-01 NOTE — ED Provider Notes (Signed)
[facility] Provider Note    Event Date/Time   First MD Initiated Contact with Patient 04/01/22 2301     (approximate)   History   Abdominal Pain and Emesis   HPI  Tammy Decker is a 52 y.o. female who presents to the ED for evaluation of Abdominal Pain and Emesis   I review 01/12/2022 hospitalist DC summary.  Was admitted for few days due to pancreatitis.  She has a history of obesity, alcohol abuse, GERD and depression.  Also treated for pyelonephritis due to Citrobacter that was pansensitive  Patient presents to the ED for evaluation of abdominal pain, nausea and emesis.  Patient reports "it feels exactly like last time." She denies any urinary symptoms such as dysuria, frequency, fevers.  Denies any stool changes.  Reports that she has really cut back her drinking and only reports having 1 beer in the past 2 weeks.   Physical Exam   Triage Vital Signs: ED Triage Vitals [04/01/22 2108]  Enc Vitals Group     BP (!) 152/91     Pulse Rate (!) 120     Resp (!) 24     Temp 98.2 F (36.8 C)     Temp src      SpO2 96 %     Weight      Height      Head Circumference      Peak Flow      Pain Score      Pain Loc      Pain Edu?      Excl. in [place]?     Most recent vital signs: Vitals:   04/02/22 0000 04/02/22 0214  BP: 131/74   Pulse: (!) 101   Resp: (!) 22   Temp:  98.1 F (36.7 C)  SpO2: 94%     General: Awake, no distress.  Seems uncomfortable initially. CV:  Good peripheral perfusion.  Resp:  Normal effort.  Abd:  No distention.  Periumbilical tenderness without peritoneal features. MSK:  No deformity noted.  Neuro:  No focal deficits appreciated. Other:     ED Results / Procedures / Treatments   Labs (all labs ordered are listed, but only abnormal results are displayed) Labs Reviewed  LIPASE, BLOOD - Abnormal; Notable for the following components:      Result Value   Lipase 115 (*)    All other components within normal  limits  COMPREHENSIVE METABOLIC PANEL - Abnormal; Notable for the following components:   Sodium 133 (*)    Chloride 97 (*)    Glucose, Bld 126 (*)    Total Protein 8.9 (*)    AST 124 (*)    ALT 92 (*)    All other components within normal limits  CBC - Abnormal; Notable for the following components:   WBC 12.0 (*)    Hemoglobin 16.4 (*)    HCT 48.3 (*)    MCV 100.4 (*)    MCH 34.1 (*)    All other components within normal limits  URINALYSIS, ROUTINE W REFLEX MICROSCOPIC - Abnormal; Notable for the following components:   Color, Urine YELLOW (*)    APPearance CLEAR (*)    Specific Gravity, Urine >1.046 (*)    Ketones, ur 5 (*)    All other components within normal limits  POC URINE PREG, ED  TROPONIN I (HIGH SENSITIVITY)  TROPONIN I (HIGH SENSITIVITY)    EKG Sinus tachycardia with a rate of 136 bpm.  Normal axis and intervals.  No clear signs of acute ischemia.  RADIOLOGY   Official radiology report(s): CT ABDOMEN PELVIS W CONTRAST  Result Date: 04/02/2022 CLINICAL DATA:  Right-sided abdominal pain and history of pancreatitis EXAM: CT ABDOMEN AND PELVIS WITH CONTRAST TECHNIQUE: Multidetector CT imaging of the abdomen and pelvis was performed using the standard protocol following bolus administration of intravenous contrast. RADIATION DOSE REDUCTION: This exam was performed according to the departmental dose-optimization program which includes automated exposure control, adjustment of the mA and/or kV according to patient size and/or use of iterative reconstruction technique. CONTRAST:  OMNIPAQUE IOHEXOL 300 MG/ML  SOLN COMPARISON:  01/12/2022 FINDINGS: Lower chest: No acute abnormality. Hepatobiliary: Fatty infiltration of the liver is noted. The gallbladder has been surgically removed. Pancreas: Pancreas is well visualized. Some peripancreatic inflammatory changes are noted about the head and uncinate process similar to that seen on the prior exam consistent with focal  pancreatitis. Spleen: Normal in size without focal abnormality. Adrenals/Urinary Tract: Adrenal glands are within normal limits. Kidneys demonstrate a normal enhancement pattern. Tiny nonobstructing renal calculi are noted bilaterally. Will excretion is noted from the kidneys bilaterally. No obstructive changes are seen. The bladder is partially distended. Stomach/Bowel: Colon is predominately decompressed. No obstructive or inflammatory changes are seen. The appendix is within normal limits. Stomach is within normal limits. Small bowel is within normal limits with the exception of mild inflammatory changes in the second portion of the duodenum related to the adjacent pancreatitis. Vascular/Lymphatic: No significant vascular findings are present. No enlarged abdominal or pelvic lymph nodes. Reproductive: Uterus and bilateral adnexa are unremarkable. Other: No abdominal wall hernia or abnormality. No abdominopelvic ascites. Musculoskeletal: No acute or significant osseous findings. Spinal stimulator is noted. IMPRESSION: Changes consistent with pancreatitis involving the head and uncinate process with associated reactive duodenitis. The overall appearance is similar to that seen on the prior exam. Fatty liver. Tiny nonobstructing renal calculi bilaterally. Electronically Signed   By: Alcide Clever M.D.   On: 04/02/2022 00:02    PROCEDURES and INTERVENTIONS:  Procedures  Medications  ondansetron [facility]) injection 4 mg (4 mg Intravenous Given 04/01/22 2112)  lactated ringers bolus 2,000 mL (0 mLs Intravenous Stopped 04/02/22 0214)  morphine (PF) 4 MG/ML injection 6 mg (6 mg Intravenous Given 04/01/22 2338)  ondansetron (ZOFRAN) injection 4 mg (4 mg Intravenous Given 04/01/22 2338)  iohexol (OMNIPAQUE) 300 MG/ML solution 100 mL (100 mLs Intravenous Contrast Given 04/01/22 2348)  fentaNYL (SUBLIMAZE) injection 50 mcg (50 mcg Intravenous Given 04/02/22 0038)  acetaminophen (TYLENOL) tablet 1,000 mg (1,000 mg Oral  Given 04/02/22 0144)  ketorolac (TORADOL) 30 MG/ML injection 15 mg (15 mg Intravenous Given 04/02/22 0139)  oxyCODONE (Oxy IR/ROXICODONE) immediate release tablet 5 mg (5 mg Oral Given 04/02/22 0138)     IMPRESSION / MDM / ASSESSMENT AND PLAN / ED COURSE  I reviewed the triage vital signs and the nursing notes.  Differential diagnosis includes, but is not limited to, pancreatitis, sepsis, UTI, choledocholithiasis, cyclic vomiting  {Patient presents with symptoms of an acute illness or injury that is potentially life-threatening.  52 year old female presents to the ED with recurrence of pancreatitis that is suitable for trial of outpatient management.  Periumbilical tenderness without peritoneal features.  Lipase is mildly elevated.  CT with inflammatory changes consistent with pancreatitis, but no complicating features.  White count is noted, but also with elevated hemoglobin suggestive of hemoconcentration.  Also with ketones in the urine without infectious features, further suggestive of dehydration.  No  indications for antibiotics.  I considered admission for this patient, but her pain is controlled, tolerating p.o. and suitable for trial of outpatient management.  Clinical Course as of 04/02/22 0245  Tue Apr 02, 2022  0122 Reassessed.  Has tolerated eating 1 cracker and sips of ginger ale without emesis.  We discussed workup.  Discussed transitioning to oral meds and attempting outpatient management if all goes well.  She is in agreement. [DS]  0230 Reassessed.  Patient reports feeling much better and is appreciative.  We discussed management at home and return precautions. [DS]    Clinical Course User Index [DS] Vladimir Crofts, MD     FINAL CLINICAL IMPRESSION(S) / ED DIAGNOSES   Final diagnoses:  Other acute pancreatitis without infection or necrosis     Rx / DC Orders   ED Discharge Orders          Ordered    ondansetron (ZOFRAN-ODT) 4 MG disintegrating tablet  Every 8 hours  PRN        04/02/22 0232    oxyCODONE (ROXICODONE) 5 MG immediate release tablet  Every 8 hours PRN        04/02/22 0232             Note:  This document was prepared using Dragon voice recognition software and may include unintentional dictation errors.   Vladimir Crofts, MD 04/02/22 [phone]

## 2022-04-02 LAB — URINALYSIS, ROUTINE W REFLEX MICROSCOPIC
Bacteria, UA: NONE SEEN
Bilirubin Urine: NEGATIVE
Glucose, UA: NEGATIVE mg/dL
Hgb urine dipstick: NEGATIVE
Ketones, ur: 5 mg/dL — AB
Leukocytes,Ua: NEGATIVE
Nitrite: NEGATIVE
Protein, ur: NEGATIVE mg/dL
Specific Gravity, Urine: >1.046 — ABNORMAL HIGH (ref 1.005–1.030)
pH: 6 (ref 5.0–8.0)

## 2022-04-02 LAB — TROPONIN I (HIGH SENSITIVITY): Troponin I (High Sensitivity): 6 ng/L (ref <18)

## 2022-04-02 LAB — POC URINE PREG, ED: Preg Test, Ur: NEGATIVE

## 2022-04-02 MED ORDER — ONDANSETRON 4 MG PO TBDP: 4.0000 mg | ORAL_TABLET | Freq: Three times a day (TID) | ORAL | 0 refills | Status: DC | PRN

## 2022-04-02 MED ORDER — KETOROLAC TROMETHAMINE 30 MG/ML IJ SOLN
15.0000 mg | Freq: Once | INTRAMUSCULAR | Status: AC
  Administered 2022-04-02: 15 mg via INTRAVENOUS
  Filled 2022-04-02: qty 1

## 2022-04-02 MED ORDER — OXYCODONE HCL 5 MG PO TABS: 5.0000 mg | ORAL_TABLET | Freq: Three times a day (TID) | ORAL | 0 refills | Status: DC | PRN

## 2022-04-02 MED ORDER — ACETAMINOPHEN 500 MG PO TABS
1000.0000 mg | ORAL_TABLET | Freq: Once | ORAL | Status: AC
  Administered 2022-04-02: 1000 mg via ORAL
  Filled 2022-04-02: qty 2

## 2022-04-02 MED ORDER — OXYCODONE HCL 5 MG PO TABS
5.0000 mg | ORAL_TABLET | Freq: Once | ORAL | Status: AC
  Administered 2022-04-02: 5 mg via ORAL
  Filled 2022-04-02: qty 1

## 2022-04-02 MED ORDER — FENTANYL CITRATE PF 50 MCG/ML IJ SOSY
50.0000 ug | PREFILLED_SYRINGE | Freq: Once | INTRAMUSCULAR | Status: AC
  Administered 2022-04-02: 50 ug via INTRAVENOUS
  Filled 2022-04-02: qty 1

## 2022-04-02 NOTE — Discharge Instructions (Addendum)
Please take Tylenol and ibuprofen/Advil for your pain.  It is safe to take them together, or to alternate them every few hours.  Take up to 1000mg  of Tylenol at a time, up to 4 times per day.  Do not take more than 4000 mg of Tylenol in 24 hours.  For ibuprofen, take 400-600 mg, 3 - 4 times per day.;  Use oxycodone as needed for more severe/breakthrough pain.  Use Zofran as needed for nausea and vomiting

## 2022-04-03 ENCOUNTER — Ambulatory Visit (INDEPENDENT_AMBULATORY_CARE_PROVIDER_SITE_OTHER): Payer: PRIVATE HEALTH INSURANCE | Admitting: Family

## 2022-04-03 ENCOUNTER — Encounter: Payer: Self-pay | Admitting: Family

## 2022-04-03 VITALS — BP 148/98 | HR 119 | Temp 98.4°F | Ht 67.5 in | Wt 212.0 lb

## 2022-04-03 DIAGNOSIS — R1013 Epigastric pain: Secondary | ICD-10-CM | POA: Insufficient documentation

## 2022-04-03 DIAGNOSIS — R748 Abnormal levels of other serum enzymes: Secondary | ICD-10-CM

## 2022-04-03 DIAGNOSIS — F1011 Alcohol abuse, in remission: Secondary | ICD-10-CM | POA: Insufficient documentation

## 2022-04-03 DIAGNOSIS — K852 Alcohol induced acute pancreatitis without necrosis or infection: Secondary | ICD-10-CM

## 2022-04-03 DIAGNOSIS — R000 Tachycardia, unspecified: Secondary | ICD-10-CM

## 2022-04-03 DIAGNOSIS — R7989 Other specified abnormal findings of blood chemistry: Secondary | ICD-10-CM | POA: Diagnosis not present

## 2022-04-03 MED ORDER — METOPROLOL TARTRATE 25 MG PO TABS: 25.0000 mg | ORAL_TABLET | Freq: Two times a day (BID) | ORAL | 0 refills | Status: DC

## 2022-04-03 NOTE — Assessment & Plan Note (Signed)
Recurrent , acute at present Did advise pt to continue with oxycodone and ibuprofen prn  Advised pt if pain worsening 10/10 go back to hospital/ER for possible fluids and pain control   Pt advised to call GI doctor whom she is established with for f/u for repeat acute pancreatitis attacks Ct abd pelvis from ER reviewed.

## 2022-04-03 NOTE — Patient Instructions (Addendum)
  A referral was placed today for gastro with Dr. Marius Ditch.  Please call to schedule appointment with her to discuss your ongoing pancreatitis.   Restart metoprolol 25 mg twice daily.   Regards,   Eugenia Pancoast FNP-C

## 2022-04-03 NOTE — Assessment & Plan Note (Signed)
Repeat in two weeks  Likely r/o pancreatitis

## 2022-04-03 NOTE — Telephone Encounter (Signed)
Has appt today will d/w her at visit

## 2022-04-03 NOTE — Progress Notes (Signed)
Established Patient Office Visit  Subjective:      CC:  Chief Complaint  Patient presents with   Medical Management of Chronic Issues    HPI: Tammy Decker is a 52 y.o. female presenting on 04/03/2022 for Medical Management of Chronic Issues . Went to [facility] 04/01/22 for c/o abdominal pain and emesis, diagnosed with acute pancreatitis. Given zofran and oxycodone. Was advised by ER to maintain soft liquid diet. Pt did admit to having one beer a few days prior.   CT abd pelvis: changes consistent with pancreatitis with associated reactive duodenitis. Fatty liver. Non obstructing renal calculi bil incidental finding  Last metabolic panel Lab Results  Component Value Date   GLUCOSE 126 (H) 04/01/2022   NA 133 (L) 04/01/2022   K 3.5 04/01/2022   CL 97 (L) 04/01/2022   CO2 22 04/01/2022   BUN 6 04/01/2022   CREATININE 0.53 04/01/2022   GFRNONAA >60 04/01/2022   CALCIUM 9.5 04/01/2022   PHOS 2.8 01/08/2022   PROT 8.9 (H) 04/01/2022   ALBUMIN 4.1 04/01/2022   LABGLOB 3.3 09/11/2020   AGRATIO 1.4 09/11/2020   BILITOT 1.0 04/01/2022   ALKPHOS 110 04/01/2022   AST 124 (H) 04/01/2022   ALT 92 (H) 04/01/2022   ANIONGAP 14 04/01/2022   Lab Results  Component Value Date   WBC 12.0 (H) 04/01/2022   HGB 16.4 (H) 04/01/2022   HCT 48.3 (H) 04/01/2022   MCV 100.4 (H) 04/01/2022   PLT 299 04/01/2022   Lab Results  Component Value Date   LIPASE 115 (H) 04/01/2022     Tachycardia: denies cp palp or sob Metoprolol 25 mg once daily. Overdue for f/u appt, was due 11/22, pt cancelled then 12/5 but pt cancelled appt.  Has cardiology appt 2/5 with Dr. Johney Frame for initial consult. Did have appt back in December but looks like pt cancelled.  New complaints: Pt states still ongoing left upper abd pain that radiates to the back. Taking the pain medication as well as zofran prn which helps with the nausea.   Requesting that she have FMLA form for ongoing flare ups of  pancreatitis. Last episode still ongoing from 1/29. Prior to that pancreatitis last flare up was  01/08/2022. When the episodes occur pain is 10/10 and hard to work.   Social history:  Relevant past medical, surgical, family and social history reviewed and updated as indicated. Interim medical history since our last visit reviewed.  Allergies and medications reviewed and updated.  DATA REVIEWED: CHART IN EPIC     ROS: Negative unless specifically indicated above in HPI.    Current Outpatient Medications:    cyanocobalamin (VITAMIN B12) 1000 MCG tablet, Take 1,000 mcg by mouth daily., Disp: , Rfl:    folic acid (FOLVITE) 1 MG tablet, Take 1 tablet (1 mg total) by mouth daily., Disp: 30 tablet, Rfl: 1   losartan (COZAAR) 100 MG tablet, Take 1 tablet (100 mg total) by mouth daily., Disp: 90 tablet, Rfl: 3   metoprolol tartrate (LOPRESSOR) 25 MG tablet, Take 1 tablet (25 mg total) by mouth 2 (two) times daily., Disp: 60 tablet, Rfl: 0   ondansetron (ZOFRAN-ODT) 4 MG disintegrating tablet, Take 1 tablet (4 mg total) by mouth every 8 (eight) hours as needed., Disp: 20 tablet, Rfl: 0   oxyCODONE (ROXICODONE) 5 MG immediate release tablet, Take 1 tablet (5 mg total) by mouth every 8 (eight) hours as needed., Disp: 10 tablet, Rfl: 0   potassium chloride SA (KLOR-CON  M) 20 MEQ tablet, Take 1 tablet (20 mEq total) by mouth 2 (two) times daily for 5 days., Disp: 10 tablet, Rfl: 0   traZODone (DESYREL) 50 MG tablet, Take 1 tablet (50 mg total) by mouth at bedtime as needed for sleep., Disp: 30 tablet, Rfl: 3      Objective:    BP (!) 148/98   Pulse (!) 119   Temp 98.4 F (36.9 C) (Oral)   Ht 5' 7.5" (1.715 m)   Wt 212 lb (96.2 kg)   SpO2 98%   BMI 32.71 kg/m   Wt Readings from Last 3 Encounters:  04/03/22 212 lb (96.2 kg)  01/31/22 225 lb (102.1 kg)  01/22/22 234 lb 2 oz (106.2 kg)    Physical Exam Constitutional:      General: She is not in acute distress.    Appearance: Normal  appearance. She is normal weight. She is not ill-appearing, toxic-appearing or diaphoretic.  HENT:     Head: Normocephalic.  Cardiovascular:     Rate and Rhythm: Normal rate and regular rhythm.  Pulmonary:     Effort: Pulmonary effort is normal.     Breath sounds: Normal breath sounds.  Abdominal:     General: Abdomen is flat. Bowel sounds are normal.     Tenderness: There is abdominal tenderness in the epigastric area.  Musculoskeletal:        General: Normal range of motion.  Neurological:     General: No focal deficit present.     Mental Status: She is alert and oriented to person, place, and time. Mental status is at baseline.  Psychiatric:        Mood and Affect: Mood normal.        Behavior: Behavior normal.        Thought Content: Thought content normal.        Judgment: Judgment normal.          Assessment & Plan:  Alcohol-induced acute pancreatitis without infection or necrosis Assessment & Plan: Recurrent , acute at present Did advise pt to continue with oxycodone and ibuprofen prn  Advised pt if pain worsening 10/10 go back to hospital/ER for possible fluids and pain control   Pt advised to call GI doctor whom she is established with for f/u for repeat acute pancreatitis attacks Ct abd pelvis from ER reviewed.  Orders: -     Ambulatory referral to Gastroenterology  Heart rate fast Assessment & Plan: Restart metoprolol 25 mg twice daily Discussed with importance for compliance to regular follow ups as scheduled and importance of staying on metoprolol Keep appt as scheduled with cardiology 04/08/22  Orders: -     Metoprolol Tartrate; Take 1 tablet (25 mg total) by mouth 2 (two) times daily.  Dispense: 60 tablet; Refill: 0 -     CBC with Differential/Platelet; Future  Elevated LFTs -     Comprehensive metabolic panel; Future  Elevated lipase -     Lipase; Future  History of alcohol abuse  Abnormal LFTs Assessment & Plan: Repeat in two weeks  Likely  r/o pancreatitis       Return in about 1 month (around 05/02/2022) for f/u tachycardia and pancreatitis.  Eugenia Pancoast, MSN, APRN, FNP-C [place]

## 2022-04-03 NOTE — Assessment & Plan Note (Signed)
Restart metoprolol 25 mg twice daily Discussed with importance for compliance to regular follow ups as scheduled and importance of staying on metoprolol Keep appt as scheduled with cardiology 04/08/22

## 2022-04-04 ENCOUNTER — Telehealth: Payer: Self-pay | Admitting: Family

## 2022-04-04 NOTE — Telephone Encounter (Signed)
FMLA form completed with my notations.  In outbox.  Can we complete the office information, and make sure all parts are completed? Then notify pt ok to pick up. She will have to complete the first box on the first page. Thanks!

## 2022-04-04 NOTE — Telephone Encounter (Signed)
Erroneous encounter

## 2022-04-04 NOTE — Telephone Encounter (Signed)
Spoke with the patient and advised the form is ready for pick up. Also informed to complete the front portion.

## 2022-04-06 NOTE — Progress Notes (Unsigned)
Cardiology Office Note:    Date:  04/08/2022   ID:  Tammy Decker, DOB Aug 12, 1970, MRN [id number]  PCP:  Eugenia Pancoast, [place] Prov[facility] Cardiologist:  None   Referring MD: Eugenia Pancoast, FNP     History of Present Illness:    Tammy Decker is a 52 y.o. female with a hx of HTN and asthma who was referred by Eugenia Pancoast, FNP for further evaluation of tachycardia/palpitations.  Patient seen by Eugenia Pancoast, FNP on 04/03/22. Notes reviewed. Had noted episodes of palpitations. She was resumed on metop twice daily and referred to Cardiology for further evaluation.  Today, the patient overall feels okay. She states that she has been having episodes of rapid HR. Will have resting HR 90-100s. Also has episodes of palpitations that can wake her up at night and last several seconds before they abate.This developed after first infection of COVID in 2021 and has been ongoing since that time. Symptoms have worsened recently due to recent COVID infection.   Also reports significant palpitations and dyspnea on exertion. If she climbs a flight of stairs, she has to sit down to catch her breath and recover before moving again. This is very abnormal for her as prior to having COVID in 2021, she was running at least a mile per day.   Family History: Father with MI at 29, sister with MI at 8.   Past Medical History:  Diagnosis Date   Asthma    Complication of anesthesia    OXYGEN SOMETIMES DROPS LOW   GERD (gastroesophageal reflux disease)    Hypertension    Pyelonephritis    Tachycardia 2020    Past Surgical History:  Procedure Laterality Date   BACK SURGERY     CHOLECYSTECTOMY     ESOPHAGOGASTRODUODENOSCOPY (EGD) WITH PROPOFOL N/A 06/19/2021   Procedure: ESOPHAGOGASTRODUODENOSCOPY (EGD) WITH PROPOFOL;  Surgeon: Lin Landsman, MD;  Location: [facility];  Service: Gastroenterology;  Laterality: N/A;   NECK SURGERY     uterine ablation     XI  ROBOTIC ASSISTED VENTRAL HERNIA N/A 05/09/2020   Procedure: XI ROBOTIC ASSISTED VENTRAL HERNIA, incisional hernia;  Surgeon: Jules Husbands, MD;  Location: [facility] ORS;  Service: General;  Laterality: N/A;    Current Medications: Current Meds  Medication Sig   cyanocobalamin (VITAMIN B12) 1000 MCG tablet Take 1,000 mcg by mouth daily.   folic acid (FOLVITE) 1 MG tablet Take 1 tablet (1 mg total) by mouth daily.   ivabradine (CORLANOR) 5 MG TABS tablet Take 3 tablets (15 mg total) by mouth once for 1 dose. Take 90-120 minutes prior to scan.   metoprolol tartrate (LOPRESSOR) 100 MG tablet Take 1 tablet (100 mg total) by mouth once for 1 dose. Take 90-120 minutes prior to scan.   metoprolol tartrate (LOPRESSOR) 25 MG tablet Take 1 tablet (25 mg total) by mouth 2 (two) times daily.   ondansetron (ZOFRAN-ODT) 4 MG disintegrating tablet Take 1 tablet (4 mg total) by mouth every 8 (eight) hours as needed.   traZODone (DESYREL) 50 MG tablet Take 1 tablet (50 mg total) by mouth at bedtime as needed for sleep.   valsartan (DIOVAN) 320 MG tablet Take 1 tablet (320 mg total) by mouth daily.   [DISCONTINUED] losartan (COZAAR) 100 MG tablet Take 1 tablet (100 mg total) by mouth daily.     Allergies:   Ciprofloxacin, Kiwi extract, and Sulfa antibiotics   Social History   Socioeconomic History   Marital status:  Married    Spouse name: Not on file   Number of children: Not on file   Years of education: Not on file   Highest education level: Not on file  Occupational History   Not on file  Tobacco Use   Smoking status: Every Day    Packs/day: 0.15    Types: Cigarettes    Passive exposure: Past   Smokeless tobacco: Never   Tobacco comments:    down to 3 cigs per day  Vaping Use   Vaping Use: Never used  Substance and Sexual Activity   Alcohol use: Yes    Comment: 1/5 whiskey nightly   Drug use: Never   Sexual activity: Yes    Partners: Male    Birth control/protection: None, Post-menopausal   Other Topics Concern   Not on file  Social History Narrative   Not on file   Social Determinants of Health   Financial Resource Strain: Not on file  Food Insecurity: No Food Insecurity (01/08/2022)   Hunger Vital Sign    Worried About Running Out of Food in the Last Year: Never true    Ran Out of Food in the Last Year: Never true  Transportation Needs: No Transportation Needs (01/08/2022)   PRAPARE - Administrator, Civil Service (Medical): No    Lack of Transportation (Non-Medical): No  Physical Activity: Not on file  Stress: Not on file  Social Connections: Not on file     Family History: The patient's family history includes Colon cancer in her maternal grandfather; Diabetes in her father and mother; Early death in her paternal grandfather; Heart attack in her father and paternal grandfather; Ovarian cancer in her maternal grandmother.  ROS:   Please see the history of present illness.     All other systems reviewed and are negative.  EKGs/Labs/Other Studies Reviewed:    The following studies were reviewed today: No new CV studies  EKG:  EKG is not ordered today  Recent Labs: 12/19/2021: TSH 3.04 01/15/2022: Magnesium 1.6 04/01/2022: ALT 92; BUN 6; Creatinine, Ser 0.53; Hemoglobin 16.4; Platelets 299; Potassium 3.5; Sodium 133  Recent Lipid Panel    Component Value Date/Time   CHOL 217 (H) 09/11/2020 1100   TRIG 77 01/08/2022 0747   HDL 81 09/11/2020 1100   CHOLHDL 2.7 09/11/2020 1100   LDLCALC 121 (H) 09/11/2020 1100     Risk Assessment/Calculations:      HYPERTENSION CONTROL Vitals:   04/08/22 0947 04/08/22 1014  BP: (!) 142/90 (!) 141/90    The patient's blood pressure is elevated above target today.  In order to address the patient's elevated BP: A new medication was prescribed today.            Physical Exam:    VS:  BP (!) 141/90   Pulse 95   Ht 5' 7.5" (1.715 m)   SpO2 94%   BMI 32.71 kg/m     Wt Readings from Last 3  Encounters:  04/03/22 212 lb (96.2 kg)  01/31/22 225 lb (102.1 kg)  01/22/22 234 lb 2 oz (106.2 kg)     GEN:  Well nourished, well developed in no acute distress HEENT: Normal CARDIAC: RRR, no murmurs, rubs, gallops RESPIRATORY:  Clear to auscultation without rales, wheezing or rhonchi  ABDOMEN: Soft, non-tender, non-distended MUSCULOSKELETAL:  No edema; No deformity  SKIN: Warm and dry NEUROLOGIC:  Alert and oriented x 3 PSYCHIATRIC:  Normal affect   ASSESSMENT:    1. Precordial pain  2. DOE (dyspnea on exertion)   3. Palpitations    PLAN:    In order of problems listed above:  #DOE: #Family History of CAD: Patient has been experiencing persistent dyspnea on exertion over the past several months. States symptoms began after her first COVID infection and have worsened. May be prolonged covid symptoms, however, given risk factors and strong family history of premature CAD in her father and sister, will check coronary CTA and TTE for further evaluation. -Check coronary CTA -Check TTE  #Palpitations: Has been ongoing since 2021 after initial COVID infection. Symptoms worse at night and with activity with associated SOB. Currently on metop with some improvement. Will check zio.  -Check zio monitor -Continue metop 25mg  BID -Increase hydration -No significant caffeine use  #HTN: Elevated today but has been off losartan due to swelling. Will start valsartan to see if she tolerates. -Continue metop 25mg  BID -Start valsartan 320mg  daily -BMET next week           Medication Adjustments/Labs and Tests Ordered: Current medicines are reviewed at length with the patient today.  Concerns regarding medicines are outlined above.  Orders Placed This Encounter  Procedures   CT CORONARY MORPH W/CTA COR W/SCORE W/CA W/CM &/OR WO/CM   Basic metabolic panel   LONG TERM MONITOR (3-14 DAYS)   ECHOCARDIOGRAM COMPLETE   Meds ordered this encounter  Medications   ivabradine  (CORLANOR) 5 MG TABS tablet    Sig: Take 3 tablets (15 mg total) by mouth once for 1 dose. Take 90-120 minutes prior to scan.    Dispense:  3 tablet    Refill:  0    Use cash pricing for corlanor procedure   metoprolol tartrate (LOPRESSOR) 100 MG tablet    Sig: Take 1 tablet (100 mg total) by mouth once for 1 dose. Take 90-120 minutes prior to scan.    Dispense:  1 tablet    Refill:  0   valsartan (DIOVAN) 320 MG tablet    Sig: Take 1 tablet (320 mg total) by mouth daily.    Dispense:  90 tablet    Refill:  1    Patient Instructions  Medication Instructions:   STOP TAKING LOSARTAN NOW  START TAKING VALSARTAN 320 MG BY MOUTH DAILY  *If you need a refill on your cardiac medications before your next appointment, please call your pharmacy*   Lab Work:  ONE WEEK HERE IN THE OFFICE--BMET  If you have labs (blood work) drawn today and your tests are completely normal, you will receive your results only by: MyChart Message (if you have MyChart) OR A paper copy in the mail If you have any lab test that is abnormal or we need to change your treatment, we will call you to review the results.   Testing/Procedures:  Your physician has requested that you have an echocardiogram. Echocardiography is a painless test that uses sound waves to create images of your heart. It provides your doctor with information about the size and shape of your heart and how well your heart's chambers and valves are working. This procedure takes approximately one hour. There are no restrictions for this procedure. Please do NOT wear cologne, perfume, aftershave, or lotions (deodorant is allowed). Please arrive 15 minutes prior to your appointment time.      Your cardiac CT will be scheduled at one of the below locations:   [facility] [address] [place], [facility] [address] [phone]   If scheduled at [facility]  [facility], please arrive at the [facility] and Children's Entrance  (Entrance C2) of [facility] 30 minutes prior to test start time. You can use the FREE valet parking offered at entrance C (encouraged to control the heart rate for the test)  Proceed to the [facility] Radiology Department (first floor) to check-in and test prep.  All radiology patients and guests should use entrance C2 at [facility], accessed from [facility], even though the hospital's physical address listed is [address].      Please follow these instructions carefully (unless otherwise directed):    On the Night Before the Test: Be sure to Drink plenty of water. Do not consume any caffeinated/decaffeinated beverages or chocolate 12 hours prior to your test. Do not take any antihistamines 12 hours prior to your test.   On the Day of the Test: Drink plenty of water until 1 hour prior to the test. Do not eat any food 1 hour prior to test. You may take your regular medications prior to the test.  Take metoprolol 100 MG (Lopressor) AND IVABRADINE 15 MG (CORLANOR)  BY MOUTH  two hours prior to test.  HOLD YOUR REGULAR SCHEDULED METOPROLOL THE MORNING OF THIS TEST AND TAKE THE 100 MG TABLET OF METOPROLOL INSTEAD FEMALES- please wear underwire-free bra if available, avoid dresses & tight clothing        After the Test: Drink plenty of water. After receiving IV contrast, you may experience a mild flushed feeling. This is normal. On occasion, you may experience a mild rash up to 24 hours after the test. This is not dangerous. If this occurs, you can take Benadryl 25 mg and increase your fluid intake. If you experience trouble breathing, this can be serious. If it is severe call 911 IMMEDIATELY. If it is mild, please call our office.  We will call to schedule your test 2-4 weeks out understanding that some insurance companies will need an authorization prior to the service being performed.   For non-scheduling related questions, please contact  the cardiac imaging nurse navigator should you have any questions/concerns: Marchia Bond, Cardiac Imaging Nurse Navigator Gordy Clement, Cardiac Imaging Nurse Navigator [facility] Direct Office Dial: [phone]   For scheduling needs, including cancellations and rescheduling, please call [place], [phone].    ZIO XT- Long Term Monitor Instructions  Your physician has requested you wear a ZIO patch monitor for 3 days.  This is a single patch monitor. [employer] supplies one patch monitor per enrollment. Additional stickers are not available. Please do not apply patch if you will be having a Nuclear Stress Test,  Echocardiogram, Cardiac CT, MRI, or Chest Xray during the period you would be wearing the  monitor. The patch cannot be worn during these tests. You cannot remove and re-apply the  ZIO XT patch monitor.  Your ZIO patch monitor will be mailed 3 day USPS to your address on file. It may take 3-5 days  to receive your monitor after you have been enrolled.  Once you have received your monitor, please review the enclosed instructions. Your monitor  has already been registered assigning a specific monitor serial # to you.  Billing and Patient Assistance Program Information  We have supplied [employer] with any of your insurance information on file for billing purposes. [employer] offers a sliding scale Patient Assistance Program for patients that do not have  insurance, or whose insurance does not completely cover the cost of  the ZIO monitor.  You must apply for the Patient Assistance Program to qualify for this discounted rate.  To apply, please call [employer] at [phone], select option 4, select option 2, ask to apply for  Patient Assistance Program. Theodore Demark will ask your household income, and how many people  are in your household. They will quote your out-of-pocket cost based on that information.  [employer] will also be able to set up a 61-month,  interest-free payment plan if needed.  Applying the monitor   Shave hair from upper left chest.  Hold abrader disc by orange tab. Rub abrader in 40 strokes over the upper left chest as  indicated in your monitor instructions.  Clean area with 4 enclosed alcohol pads. Let dry.  Apply patch as indicated in monitor instructions. Patch will be placed under collarbone on left  side of chest with arrow pointing upward.  Rub patch adhesive wings for 2 minutes. Remove white label marked "1". Remove the white  label marked "2". Rub patch adhesive wings for 2 additional minutes.  While looking in a mirror, press and release button in center of patch. A small green light will  flash 3-4 times. This will be your only indicator that the monitor has been turned on.  Do not shower for the first 24 hours. You may shower after the first 24 hours.  Press the button if you feel a symptom. You will hear a small click. Record Date, Time and  Symptom in the Patient Logbook.  When you are ready to remove the patch, follow instructions on the last 2 pages of Patient  Logbook. Stick patch monitor onto the last page of Patient Logbook.  Place Patient Logbook in the blue and white box. Use locking tab on box and tape box closed  securely. The blue and white box has prepaid postage on it. Please place it in the mailbox as  soon as possible. Your physician should have your test results approximately 7 days after the  monitor has been mailed back to [facility].  Call [place] at [phone] if you have questions regarding  your ZIO XT patch monitor. Call them immediately if you see an orange light blinking on your  monitor.  If your monitor falls off in less than 4 days, contact our Monitor department at [phone].  If your monitor becomes loose or falls off after 4 days call [employer] at [phone] for  suggestions on securing your monitor    Follow-Up: At [facility],  you and your health needs are our priority.  As part of our continuing mission to provide you with exceptional heart care, we have created designated Provider Care Teams.  These Care Teams include your primary Cardiologist (physician) and Advanced Practice Providers (APPs -  Physician Assistants and Nurse Practitioners) who all work together to provide you with the care you need, when you need it.  We recommend signing up for the patient portal called "MyChart".  Sign up information is provided on this After Visit Summary.  MyChart is used to connect with patients for Virtual Visits (Telemedicine).  Patients are able to view lab/test results, encounter notes, upcoming appointments, etc.  Non-urgent messages can be sent to your provider as well.   To learn more about what you can do with MyChart, go to [email or web address].    Your next appointment:   6 month(s)  Provider:   DR. Johney Frame     Signed, Freada Bergeron, MD  04/08/2022 10:31 AM  [place]

## 2022-04-08 ENCOUNTER — Encounter: Payer: Self-pay | Admitting: Cardiology

## 2022-04-08 ENCOUNTER — Telehealth: Payer: Self-pay | Admitting: *Deleted

## 2022-04-08 ENCOUNTER — Ambulatory Visit: Payer: PRIVATE HEALTH INSURANCE | Attending: Interventional Cardiology | Admitting: Cardiology

## 2022-04-08 ENCOUNTER — Other Ambulatory Visit (HOSPITAL_COMMUNITY): Payer: Self-pay

## 2022-04-08 ENCOUNTER — Ambulatory Visit (INDEPENDENT_AMBULATORY_CARE_PROVIDER_SITE_OTHER): Payer: PRIVATE HEALTH INSURANCE

## 2022-04-08 VITALS — BP 141/90 | HR 95 | Ht 67.5 in

## 2022-04-08 DIAGNOSIS — R0609 Other forms of dyspnea: Secondary | ICD-10-CM

## 2022-04-08 DIAGNOSIS — R002 Palpitations: Secondary | ICD-10-CM

## 2022-04-08 DIAGNOSIS — R072 Precordial pain: Secondary | ICD-10-CM

## 2022-04-08 DIAGNOSIS — Z8249 Family history of ischemic heart disease and other diseases of the circulatory system: Secondary | ICD-10-CM

## 2022-04-08 MED ORDER — IVABRADINE HCL 5 MG PO TABS
15.0000 mg | ORAL_TABLET | Freq: Once | ORAL | 0 refills | Status: AC
  Filled 2022-04-08: qty 3, 1d supply, fill #0

## 2022-04-08 MED ORDER — VALSARTAN 320 MG PO TABS: 320.0000 mg | ORAL_TABLET | Freq: Every day | ORAL | 1 refills | Status: DC

## 2022-04-08 MED ORDER — METOPROLOL TARTRATE 100 MG PO TABS
100.0000 mg | ORAL_TABLET | Freq: Once | ORAL | 0 refills | Status: DC
  Filled 2022-04-08: qty 1, 1d supply, fill #0

## 2022-04-08 NOTE — Progress Notes (Unsigned)
Enrolled for [employer] to mail a ZIO XT long term holter monitor to the patients address on file.  

## 2022-04-08 NOTE — Patient Instructions (Signed)
Medication Instructions:   STOP TAKING LOSARTAN NOW  START TAKING VALSARTAN 320 MG BY MOUTH DAILY  *If you need a refill on your cardiac medications before your next appointment, please call your pharmacy*   Lab Work:  [facility] OFFICE--BMET  If you have labs (blood work) drawn today and your tests are completely normal, you will receive your results only by: [place] (if you have MyChart) OR A paper copy in the mail If you have any lab test that is abnormal or we need to change your treatment, we will call you to review the results.   Testing/Procedures:  Your physician has requested that you have an echocardiogram. Echocardiography is a painless test that uses sound waves to create images of your heart. It provides your doctor with information about the size and shape of your heart and how well your heart's chambers and valves are working. This procedure takes approximately one hour. There are no restrictions for this procedure. Please do NOT wear cologne, perfume, aftershave, or lotions (deodorant is allowed). Please arrive 15 minutes prior to your appointment time.      Your cardiac CT will be scheduled at one of the below locations:   [facility] [address] [place], [place] [address] [phone]   If scheduled at [facility], please arrive at the [facility] and Children's Entrance (Entrance C2) of [facility] 30 minutes prior to test start time. You can use the FREE valet parking offered at entrance C (encouraged to control the heart rate for the test)  Proceed to the [facility] Radiology Department (first floor) to check-in and test prep.  All radiology patients and guests should use entrance C2 at [facility], accessed from [facility], even though the hospital's physical address listed is [address].      Please follow these instructions carefully (unless otherwise  directed):    On the Night Before the Test: Be sure to Drink plenty of water. Do not consume any caffeinated/decaffeinated beverages or chocolate 12 hours prior to your test. Do not take any antihistamines 12 hours prior to your test.   On the Day of the Test: Drink plenty of water until 1 hour prior to the test. Do not eat any food 1 hour prior to test. You may take your regular medications prior to the test.  Take metoprolol 100 MG (Lopressor) AND IVABRADINE 15 MG (CORLANOR)  BY MOUTH  two hours prior to test.  HOLD YOUR REGULAR SCHEDULED METOPROLOL THE MORNING OF THIS TEST AND TAKE THE 100 MG TABLET OF METOPROLOL INSTEAD FEMALES- please wear underwire-free bra if available, avoid dresses & tight clothing        After the Test: Drink plenty of water. After receiving IV contrast, you may experience a mild flushed feeling. This is normal. On occasion, you may experience a mild rash up to 24 hours after the test. This is not dangerous. If this occurs, you can take Benadryl 25 mg and increase your fluid intake. If you experience trouble breathing, this can be serious. If it is severe call 911 IMMEDIATELY. If it is mild, please call our office.  We will call to schedule your test 2-4 weeks out understanding that some insurance companies will need an authorization prior to the service being performed.   For non-scheduling related questions, please contact the cardiac imaging nurse navigator should you have any questions/concerns: Marchia Bond, Cardiac Imaging Nurse Navigator Gordy Clement,  Cardiac Imaging Nurse [facility] Direct Office Dial: [phone]   For scheduling needs, including cancellations and rescheduling, please call [place], [phone].    ZIO XT- Long Term Monitor Instructions  Your physician has requested you wear a ZIO patch monitor for 3 days.  This is a single patch monitor. [employer] supplies one patch monitor per  enrollment. Additional stickers are not available. Please do not apply patch if you will be having a Nuclear Stress Test,  Echocardiogram, Cardiac CT, MRI, or Chest Xray during the period you would be wearing the  monitor. The patch cannot be worn during these tests. You cannot remove and re-apply the  ZIO XT patch monitor.  Your ZIO patch monitor will be mailed 3 day USPS to your address on file. It may take 3-5 days  to receive your monitor after you have been enrolled.  Once you have received your monitor, please review the enclosed instructions. Your monitor  has already been registered assigning a specific monitor serial # to you.  Billing and Patient Assistance Program Information  We have supplied [employer] with any of your insurance information on file for billing purposes. [employer] offers a sliding scale Patient Assistance Program for patients that do not have  insurance, or whose insurance does not completely cover the cost of the ZIO monitor.  You must apply for the Patient Assistance Program to qualify for this discounted rate.  To apply, please call [employer] at [phone], select option 4, select option 2, ask to apply for  Patient Assistance Program. Theodore Demark will ask your household income, and how many people  are in your household. They will quote your out-of-pocket cost based on that information.  [employer] will also be able to set up a 8-month, interest-free payment plan if needed.  Applying the monitor   Shave hair from upper left chest.  Hold abrader disc by orange tab. Rub abrader in 40 strokes over the upper left chest as  indicated in your monitor instructions.  Clean area with 4 enclosed alcohol pads. Let dry.  Apply patch as indicated in monitor instructions. Patch will be placed under collarbone on left  side of chest with arrow pointing upward.  Rub patch adhesive wings for 2 minutes. Remove white label marked "1". Remove the white  label marked "2". Rub patch  adhesive wings for 2 additional minutes.  While looking in a mirror, press and release button in center of patch. A small green light will  flash 3-4 times. This will be your only indicator that the monitor has been turned on.  Do not shower for the first 24 hours. You may shower after the first 24 hours.  Press the button if you feel a symptom. You will hear a small click. Record Date, Time and  Symptom in the Patient Logbook.  When you are ready to remove the patch, follow instructions on the last 2 pages of Patient  Logbook. Stick patch monitor onto the last page of Patient Logbook.  Place Patient Logbook in the blue and white box. Use locking tab on box and tape box closed  securely. The blue and white box has prepaid postage on it. Please place it in the mailbox as  soon as possible. Your physician should have your test results approximately 7 days after the  monitor has been mailed back to [facility].  Call [place] at [phone] if you have questions regarding  your ZIO XT patch monitor. Call them immediately  if you see an orange light blinking on your  monitor.  If your monitor falls off in less than 4 days, contact our Monitor department at [phone].  If your monitor becomes loose or falls off after 4 days call [employer] at [phone] for  suggestions on securing your monitor    Follow-Up: At [facility], you and your health needs are our priority.  As part of our continuing mission to provide you with exceptional heart care, we have created designated Provider Care Teams.  These Care Teams include your primary Cardiologist (physician) and Advanced Practice Providers (APPs -  Physician Assistants and Nurse Practitioners) who all work together to provide you with the care you need, when you need it.  We recommend signing up for the patient portal called "MyChart".  Sign up information is provided on this After Visit Summary.  MyChart is used  to connect with patients for Virtual Visits (Telemedicine).  Patients are able to view lab/test results, encounter notes, upcoming appointments, etc.  Non-urgent messages can be sent to your provider as well.   To learn more about what you can do with MyChart, go to [email or web address].    Your next appointment:   6 month(s)  Provider:   DR. Johney Frame

## 2022-04-08 NOTE — Telephone Encounter (Signed)
-----   Message from Melony Overly sent at 04/08/2022  4:08 PM EST ----- Regarding: ct heart Scheduled 04/15/21 at 2:30

## 2022-04-10 DIAGNOSIS — R072 Precordial pain: Secondary | ICD-10-CM

## 2022-04-10 DIAGNOSIS — R002 Palpitations: Secondary | ICD-10-CM

## 2022-04-10 DIAGNOSIS — R0609 Other forms of dyspnea: Secondary | ICD-10-CM | POA: Diagnosis not present

## 2022-04-12 ENCOUNTER — Other Ambulatory Visit (HOSPITAL_COMMUNITY): Payer: Self-pay

## 2022-04-12 ENCOUNTER — Telehealth (HOSPITAL_COMMUNITY): Payer: Self-pay | Admitting: Emergency Medicine

## 2022-04-12 NOTE — Telephone Encounter (Signed)
Reaching out to patient to offer assistance regarding upcoming cardiac imaging study; pt verbalizes understanding of appt date/time, parking situation and where to check in, pre-test NPO status and medications ordered, and verified current allergies; name and call back number provided for further questions should they arise Marchia Bond RN Navigator Cardiac Imaging [facility] [phone] office [phone] cell  Arrival [address] entrance Denies iv issues 135m metoprolol tartrate  Aware contrast/nitro

## 2022-04-15 ENCOUNTER — Ambulatory Visit (HOSPITAL_COMMUNITY)
Admission: RE | Admit: 2022-04-15 | Discharge: 2022-04-15 | Disposition: A | Discharge status: Home / Self Care | Payer: PRIVATE HEALTH INSURANCE | Source: Ambulatory Visit | Attending: Cardiology | Admitting: Cardiology

## 2022-04-15 ENCOUNTER — Other Ambulatory Visit: Payer: PRIVATE HEALTH INSURANCE

## 2022-04-15 DIAGNOSIS — R002 Palpitations: Secondary | ICD-10-CM

## 2022-04-15 DIAGNOSIS — R0609 Other forms of dyspnea: Secondary | ICD-10-CM | POA: Insufficient documentation

## 2022-04-15 DIAGNOSIS — R072 Precordial pain: Secondary | ICD-10-CM | POA: Diagnosis present

## 2022-04-15 MED ORDER — DILTIAZEM HCL 25 MG/5ML IV SOLN
10.0000 mg | Freq: Once | INTRAVENOUS | Status: AC
  Administered 2022-04-15: 10 mg via INTRAVENOUS

## 2022-04-15 MED ORDER — DILTIAZEM HCL 25 MG/5ML IV SOLN
INTRAVENOUS | Status: AC
  Administered 2022-04-15: 10 mg via INTRAVENOUS
  Filled 2022-04-15: qty 5

## 2022-04-15 MED ORDER — METOPROLOL TARTRATE 5 MG/5ML IV SOLN: 10.0000 mg | INTRAVENOUS | Status: DC | PRN

## 2022-04-15 MED ORDER — IOHEXOL 350 MG/ML SOLN
95.0000 mL | Freq: Once | INTRAVENOUS | Status: AC | PRN
  Administered 2022-04-15: 95 mL via INTRAVENOUS

## 2022-04-15 MED ORDER — NITROGLYCERIN 0.4 MG SL SUBL
SUBLINGUAL_TABLET | SUBLINGUAL | Status: AC
  Administered 2022-04-15: 0.8 mg via SUBLINGUAL
  Filled 2022-04-15: qty 2

## 2022-04-15 MED ORDER — METOPROLOL TARTRATE 5 MG/5ML IV SOLN
INTRAVENOUS | Status: AC
  Filled 2022-04-15: qty 10

## 2022-04-15 MED ORDER — METOPROLOL TARTRATE 5 MG/5ML IV SOLN
10.0000 mg | INTRAVENOUS | Status: DC | PRN
  Administered 2022-04-15: 10 mg via INTRAVENOUS

## 2022-04-15 MED ORDER — METOPROLOL TARTRATE 5 MG/5ML IV SOLN
INTRAVENOUS | Status: AC
  Administered 2022-04-15: 10 mg via INTRAVENOUS
  Filled 2022-04-15: qty 10

## 2022-04-15 MED ORDER — DILTIAZEM HCL 25 MG/5ML IV SOLN: 10.0000 mg | Freq: Once | INTRAVENOUS | Status: AC

## 2022-04-15 MED ORDER — NITROGLYCERIN 0.4 MG SL SUBL: 0.8000 mg | SUBLINGUAL_TABLET | Freq: Once | SUBLINGUAL | Status: AC

## 2022-04-15 NOTE — Progress Notes (Signed)
Pt. Asymptomatic. "I feel fine."

## 2022-04-19 ENCOUNTER — Emergency Department
Admission: EM | Admit: 2022-04-19 | Discharge: 2022-04-19 | Disposition: A | Discharge status: Home / Self Care | Payer: PRIVATE HEALTH INSURANCE | Source: Home / Self Care | Attending: Emergency Medicine | Admitting: Emergency Medicine

## 2022-04-19 ENCOUNTER — Emergency Department: Payer: PRIVATE HEALTH INSURANCE

## 2022-04-19 ENCOUNTER — Other Ambulatory Visit: Payer: Self-pay

## 2022-04-19 ENCOUNTER — Inpatient Hospital Stay
Admission: EM | Admit: 2022-04-19 | Discharge: 2022-04-22 | DRG: 439 | Disposition: A | Discharge status: Home / Self Care | Payer: PRIVATE HEALTH INSURANCE | Attending: Family Medicine | Admitting: Family Medicine

## 2022-04-19 DIAGNOSIS — F331 Major depressive disorder, recurrent, moderate: Secondary | ICD-10-CM | POA: Diagnosis present

## 2022-04-19 DIAGNOSIS — Z91018 Allergy to other foods: Secondary | ICD-10-CM

## 2022-04-19 DIAGNOSIS — R112 Nausea with vomiting, unspecified: Secondary | ICD-10-CM | POA: Diagnosis present

## 2022-04-19 DIAGNOSIS — K852 Alcohol induced acute pancreatitis without necrosis or infection: Secondary | ICD-10-CM

## 2022-04-19 DIAGNOSIS — R1114 Bilious vomiting: Secondary | ICD-10-CM

## 2022-04-19 DIAGNOSIS — Z720 Tobacco use: Secondary | ICD-10-CM | POA: Diagnosis present

## 2022-04-19 DIAGNOSIS — Z882 Allergy status to sulfonamides status: Secondary | ICD-10-CM | POA: Diagnosis not present

## 2022-04-19 DIAGNOSIS — R748 Abnormal levels of other serum enzymes: Secondary | ICD-10-CM | POA: Diagnosis present

## 2022-04-19 DIAGNOSIS — Z7141 Alcohol abuse counseling and surveillance of alcoholic: Secondary | ICD-10-CM | POA: Diagnosis not present

## 2022-04-19 DIAGNOSIS — Z9049 Acquired absence of other specified parts of digestive tract: Secondary | ICD-10-CM

## 2022-04-19 DIAGNOSIS — R1084 Generalized abdominal pain: Secondary | ICD-10-CM

## 2022-04-19 DIAGNOSIS — R739 Hyperglycemia, unspecified: Secondary | ICD-10-CM | POA: Diagnosis present

## 2022-04-19 DIAGNOSIS — I10 Essential (primary) hypertension: Secondary | ICD-10-CM | POA: Diagnosis present

## 2022-04-19 DIAGNOSIS — R000 Tachycardia, unspecified: Secondary | ICD-10-CM | POA: Insufficient documentation

## 2022-04-19 DIAGNOSIS — F1020 Alcohol dependence, uncomplicated: Secondary | ICD-10-CM | POA: Diagnosis present

## 2022-04-19 DIAGNOSIS — I700 Atherosclerosis of aorta: Secondary | ICD-10-CM | POA: Diagnosis present

## 2022-04-19 DIAGNOSIS — I2510 Atherosclerotic heart disease of native coronary artery without angina pectoris: Secondary | ICD-10-CM | POA: Diagnosis present

## 2022-04-19 DIAGNOSIS — R7989 Other specified abnormal findings of blood chemistry: Secondary | ICD-10-CM | POA: Diagnosis present

## 2022-04-19 DIAGNOSIS — F419 Anxiety disorder, unspecified: Secondary | ICD-10-CM | POA: Diagnosis present

## 2022-04-19 DIAGNOSIS — Z79899 Other long term (current) drug therapy: Secondary | ICD-10-CM

## 2022-04-19 DIAGNOSIS — K2980 Duodenitis without bleeding: Secondary | ICD-10-CM | POA: Diagnosis present

## 2022-04-19 DIAGNOSIS — K859 Acute pancreatitis without necrosis or infection, unspecified: Principal | ICD-10-CM

## 2022-04-19 DIAGNOSIS — K760 Fatty (change of) liver, not elsewhere classified: Secondary | ICD-10-CM | POA: Diagnosis present

## 2022-04-19 DIAGNOSIS — K219 Gastro-esophageal reflux disease without esophagitis: Secondary | ICD-10-CM | POA: Diagnosis present

## 2022-04-19 DIAGNOSIS — K700 Alcoholic fatty liver: Secondary | ICD-10-CM | POA: Diagnosis present

## 2022-04-19 DIAGNOSIS — F32A Depression, unspecified: Secondary | ICD-10-CM | POA: Diagnosis present

## 2022-04-19 DIAGNOSIS — G4700 Insomnia, unspecified: Secondary | ICD-10-CM | POA: Diagnosis present

## 2022-04-19 DIAGNOSIS — Z881 Allergy status to other antibiotic agents status: Secondary | ICD-10-CM | POA: Diagnosis not present

## 2022-04-19 DIAGNOSIS — K709 Alcoholic liver disease, unspecified: Secondary | ICD-10-CM | POA: Diagnosis present

## 2022-04-19 DIAGNOSIS — D7589 Other specified diseases of blood and blood-forming organs: Secondary | ICD-10-CM | POA: Diagnosis present

## 2022-04-19 DIAGNOSIS — R109 Unspecified abdominal pain: Secondary | ICD-10-CM | POA: Diagnosis present

## 2022-04-19 DIAGNOSIS — J45909 Unspecified asthma, uncomplicated: Secondary | ICD-10-CM | POA: Diagnosis present

## 2022-04-19 LAB — LIPID PANEL
Cholesterol: 199 mg/dL (ref 0–200)
HDL: 42 mg/dL (ref >40)
LDL Cholesterol: 137 mg/dL — ABNORMAL HIGH (ref 0–99)
Total CHOL/HDL Ratio: 4.7 RATIO
Triglycerides: 99 mg/dL (ref <150)
VLDL: 20 mg/dL (ref 0–40)

## 2022-04-19 LAB — PROTIME-INR
INR: 1.1 (ref 0.8–1.2)
Prothrombin Time: 14.4 seconds (ref 11.4–15.2)

## 2022-04-19 LAB — URINE DRUG SCREEN, QUALITATIVE (ARMC ONLY)
Amphetamines, Ur Screen: NOT DETECTED
Barbiturates, Ur Screen: NOT DETECTED
Benzodiazepine, Ur Scrn: NOT DETECTED
Cannabinoid 50 Ng, Ur ~~LOC~~: NOT DETECTED
Cocaine Metabolite,Ur ~~LOC~~: NOT DETECTED
MDMA (Ecstasy)Ur Screen: NOT DETECTED
Methadone Scn, Ur: NOT DETECTED
Opiate, Ur Screen: POSITIVE — AB
Phencyclidine (PCP) Ur S: NOT DETECTED
Tricyclic, Ur Screen: NOT DETECTED

## 2022-04-19 LAB — COMPREHENSIVE METABOLIC PANEL
ALT: 71 U/L — ABNORMAL HIGH (ref 0–44)
AST: 75 U/L — ABNORMAL HIGH (ref 15–41)
Albumin: 3.7 g/dL (ref 3.5–5.0)
Alkaline Phosphatase: 95 U/L (ref 38–126)
Anion gap: 7 (ref 5–15)
BUN: 8 mg/dL (ref 6–20)
CO2: 25 mmol/L (ref 22–32)
Calcium: 8.6 mg/dL — ABNORMAL LOW (ref 8.9–10.3)
Chloride: 102 mmol/L (ref 98–111)
Creatinine, Ser: 0.45 mg/dL (ref 0.44–1.00)
GFR, Estimated: >60 mL/min (ref >60)
Glucose, Bld: 144 mg/dL — ABNORMAL HIGH (ref 70–99)
Potassium: 3.6 mmol/L (ref 3.5–5.1)
Sodium: 134 mmol/L — ABNORMAL LOW (ref 135–145)
Total Bilirubin: 0.7 mg/dL (ref 0.3–1.2)
Total Protein: 8 g/dL (ref 6.5–8.1)

## 2022-04-19 LAB — URINALYSIS, ROUTINE W REFLEX MICROSCOPIC
Bilirubin Urine: NEGATIVE
Glucose, UA: NEGATIVE mg/dL
Hgb urine dipstick: NEGATIVE
Ketones, ur: 5 mg/dL — AB
Leukocytes,Ua: NEGATIVE
Nitrite: NEGATIVE
Protein, ur: 30 mg/dL — AB
Specific Gravity, Urine: 1.034 — ABNORMAL HIGH (ref 1.005–1.030)
pH: 6 (ref 5.0–8.0)

## 2022-04-19 LAB — FOLATE: Folate: 10.7 ng/mL (ref >5.9)

## 2022-04-19 LAB — CBC
HCT: 47 % — ABNORMAL HIGH (ref 36.0–46.0)
Hemoglobin: 15.7 g/dL — ABNORMAL HIGH (ref 12.0–15.0)
MCH: 33.7 pg (ref 26.0–34.0)
MCHC: 33.4 g/dL (ref 30.0–36.0)
MCV: 100.9 fL — ABNORMAL HIGH (ref 80.0–100.0)
Platelets: 285 10*3/uL (ref 150–400)
RBC: 4.66 MIL/uL (ref 3.87–5.11)
RDW: 13.9 % (ref 11.5–15.5)
WBC: 9.7 10*3/uL (ref 4.0–10.5)
nRBC: 0 % (ref 0.0–0.2)

## 2022-04-19 LAB — TSH: TSH: 12.11 u[IU]/mL — ABNORMAL HIGH (ref 0.350–4.500)

## 2022-04-19 LAB — ETHANOL: Alcohol, Ethyl (B): <10 mg/dL (ref <10)

## 2022-04-19 LAB — T4, FREE: Free T4: 0.85 ng/dL (ref 0.61–1.12)

## 2022-04-19 LAB — TRIGLYCERIDES: Triglycerides: 65 mg/dL (ref <150)

## 2022-04-19 LAB — LIPASE, BLOOD: Lipase: 198 U/L — ABNORMAL HIGH (ref 11–51)

## 2022-04-19 LAB — MAGNESIUM: Magnesium: 1.8 mg/dL (ref 1.7–2.4)

## 2022-04-19 MED ORDER — LORAZEPAM 2 MG/ML IJ SOLN: 0.0000 mg | Freq: Four times a day (QID) | INTRAMUSCULAR | Status: DC

## 2022-04-19 MED ORDER — HEPARIN SODIUM (PORCINE) 5000 UNIT/ML IJ SOLN
5000.0000 [IU] | Freq: Two times a day (BID) | INTRAMUSCULAR | Status: DC
  Administered 2022-04-19 – 2022-04-21 (×5): 5000 [IU] via SUBCUTANEOUS
  Filled 2022-04-19 (×6): qty 1

## 2022-04-19 MED ORDER — ADULT MULTIVITAMIN W/MINERALS CH
1.0000 | ORAL_TABLET | Freq: Every day | ORAL | Status: DC
  Administered 2022-04-20 – 2022-04-22 (×3): 1 via ORAL
  Filled 2022-04-19 (×4): qty 1

## 2022-04-19 MED ORDER — IRBESARTAN 150 MG PO TABS
75.0000 mg | ORAL_TABLET | Freq: Every day | ORAL | Status: DC
  Administered 2022-04-20 – 2022-04-22 (×3): 75 mg via ORAL
  Filled 2022-04-19 (×5): qty 1

## 2022-04-19 MED ORDER — MORPHINE SULFATE (PF) 2 MG/ML IV SOLN
2.0000 mg | INTRAVENOUS | Status: DC | PRN
  Administered 2022-04-19: 2 mg via INTRAVENOUS
  Filled 2022-04-19: qty 1

## 2022-04-19 MED ORDER — OXYCODONE-ACETAMINOPHEN 5-325 MG PO TABS
1.0000 | ORAL_TABLET | Freq: Once | ORAL | Status: AC
  Administered 2022-04-19: 1 via ORAL
  Filled 2022-04-19: qty 1

## 2022-04-19 MED ORDER — ONDANSETRON 4 MG PO TBDP: 4.0000 mg | ORAL_TABLET | Freq: Three times a day (TID) | ORAL | 0 refills | Status: DC | PRN

## 2022-04-19 MED ORDER — IOHEXOL 300 MG/ML  SOLN
100.0000 mL | Freq: Once | INTRAMUSCULAR | Status: AC | PRN
  Administered 2022-04-19: 100 mL via INTRAVENOUS

## 2022-04-19 MED ORDER — LACTATED RINGERS IV SOLN: INTRAVENOUS | Status: DC

## 2022-04-19 MED ORDER — ACETAMINOPHEN 650 MG RE SUPP: 650.0000 mg | Freq: Four times a day (QID) | RECTAL | Status: DC | PRN

## 2022-04-19 MED ORDER — SODIUM CHLORIDE 0.9 % IV BOLUS
1000.0000 mL | Freq: Once | INTRAVENOUS | Status: AC
  Administered 2022-04-19: 1000 mL via INTRAVENOUS

## 2022-04-19 MED ORDER — THIAMINE MONONITRATE 100 MG PO TABS
100.0000 mg | ORAL_TABLET | Freq: Every day | ORAL | Status: DC
  Administered 2022-04-20 – 2022-04-22 (×3): 100 mg via ORAL
  Filled 2022-04-19 (×4): qty 1

## 2022-04-19 MED ORDER — NICOTINE 21 MG/24HR TD PT24
21.0000 mg | MEDICATED_PATCH | Freq: Every day | TRANSDERMAL | Status: DC
  Administered 2022-04-19 – 2022-04-22 (×4): 21 mg via TRANSDERMAL
  Filled 2022-04-19 (×4): qty 1

## 2022-04-19 MED ORDER — PANTOPRAZOLE SODIUM 40 MG IV SOLR
40.0000 mg | Freq: Two times a day (BID) | INTRAVENOUS | Status: DC
  Administered 2022-04-19 – 2022-04-22 (×6): 40 mg via INTRAVENOUS
  Filled 2022-04-19 (×6): qty 10

## 2022-04-19 MED ORDER — ACETAMINOPHEN 325 MG PO TABS: 650.0000 mg | ORAL_TABLET | Freq: Four times a day (QID) | ORAL | Status: DC | PRN

## 2022-04-19 MED ORDER — ONDANSETRON HCL 4 MG PO TABS: 4.0000 mg | ORAL_TABLET | Freq: Four times a day (QID) | ORAL | Status: DC | PRN

## 2022-04-19 MED ORDER — SODIUM CHLORIDE 0.9% FLUSH
3.0000 mL | Freq: Two times a day (BID) | INTRAVENOUS | Status: DC
  Administered 2022-04-21 – 2022-04-22 (×3): 3 mL via INTRAVENOUS

## 2022-04-19 MED ORDER — SODIUM CHLORIDE 0.9 % IV SOLN: Freq: Once | INTRAVENOUS | Status: AC

## 2022-04-19 MED ORDER — MORPHINE SULFATE (PF) 4 MG/ML IV SOLN
4.0000 mg | Freq: Once | INTRAVENOUS | Status: AC
  Administered 2022-04-19: 4 mg via INTRAVENOUS
  Filled 2022-04-19: qty 1

## 2022-04-19 MED ORDER — LORAZEPAM 2 MG/ML IJ SOLN: 0.0000 mg | Freq: Two times a day (BID) | INTRAMUSCULAR | Status: DC

## 2022-04-19 MED ORDER — KETOROLAC TROMETHAMINE 15 MG/ML IJ SOLN
15.0000 mg | Freq: Once | INTRAMUSCULAR | Status: AC
  Administered 2022-04-19: 15 mg via INTRAVENOUS
  Filled 2022-04-19: qty 1

## 2022-04-19 MED ORDER — THIAMINE HCL 100 MG/ML IJ SOLN: 100.0000 mg | Freq: Every day | INTRAMUSCULAR | Status: DC

## 2022-04-19 MED ORDER — FOLIC ACID 1 MG PO TABS
1.0000 mg | ORAL_TABLET | Freq: Every day | ORAL | Status: DC
  Administered 2022-04-20 – 2022-04-22 (×3): 1 mg via ORAL
  Filled 2022-04-19 (×4): qty 1

## 2022-04-19 MED ORDER — HYDROMORPHONE HCL 1 MG/ML IJ SOLN
0.5000 mg | INTRAMUSCULAR | Status: DC | PRN
  Administered 2022-04-20 – 2022-04-22 (×14): 0.5 mg via INTRAVENOUS
  Filled 2022-04-19 (×14): qty 0.5

## 2022-04-19 MED ORDER — ONDANSETRON HCL 4 MG/2ML IJ SOLN
4.0000 mg | Freq: Once | INTRAMUSCULAR | Status: AC
  Administered 2022-04-19: 4 mg via INTRAVENOUS
  Filled 2022-04-19: qty 2

## 2022-04-19 MED ORDER — LORAZEPAM 2 MG/ML IJ SOLN
0.5000 mg | Freq: Once | INTRAMUSCULAR | Status: AC
  Administered 2022-04-19: 0.5 mg via INTRAVENOUS
  Filled 2022-04-19: qty 1

## 2022-04-19 MED ORDER — OXYCODONE-ACETAMINOPHEN 5-325 MG PO TABS: 1.0000 | ORAL_TABLET | Freq: Four times a day (QID) | ORAL | 0 refills | Status: AC | PRN

## 2022-04-19 MED ORDER — ONDANSETRON HCL 4 MG/2ML IJ SOLN
4.0000 mg | Freq: Four times a day (QID) | INTRAMUSCULAR | Status: DC | PRN
  Administered 2022-04-20 – 2022-04-22 (×4): 4 mg via INTRAVENOUS
  Filled 2022-04-19 (×4): qty 2

## 2022-04-19 NOTE — ED Triage Notes (Signed)
Pt comes with c/o belly pain and nausea. Pt states this all started Monday afternoon. Pt states epigastric pain. Pt states it radiates to right side of back. Pt states hx of pancreatitis.

## 2022-04-19 NOTE — ED Notes (Signed)
Called for transport for patient

## 2022-04-19 NOTE — Assessment & Plan Note (Signed)
A1c

## 2022-04-19 NOTE — ED Notes (Signed)
No repeat labs collected in triage. Pt would like to wait until placed in treatment room for IV and labs.

## 2022-04-19 NOTE — Assessment & Plan Note (Addendum)
2/2 etoh abuse and fatty liver.  Pt has h./o cholecystectomy.  RUQ usg per AM team and GI consult per Am team as needed.

## 2022-04-19 NOTE — Assessment & Plan Note (Signed)
IV PPI. PRN zofran.

## 2022-04-19 NOTE — ED Triage Notes (Signed)
Pt to ED from home. Pt was just seen here today for same. Pt states she went home with prescription PO pain meds but gags when she takes them. SO she came back. Pt is CAOx4 and in no acute distress and ambulatory in triage.

## 2022-04-19 NOTE — Assessment & Plan Note (Addendum)
Resume home meds once med rec is available.

## 2022-04-19 NOTE — Discharge Instructions (Addendum)
You were seen in the emergency department and diagnosed with pancreatitis.  Your CT scan showed mild inflammation of your pancreas and your duodenum.  Concerned this is from drinking alcohol.  Do not drink any alcohol as this will worsen your symptoms.  Follow-up closely with your GI doctor.  You are given a prescription for pain medication, if you continue to have severe pain despite pain medication or severe nausea and vomiting return to the emergency department for reevaluation.  You are given an eating plan for pancreatitis it is important that you follow.  You were given a prescription for narcotic pain medications.  Take only if in severe pain.  These are very addictive medications.  These medications can make you constipated.  If you need to take more than 1-2 doses, start a stool softner.  If you become constipated, take 1 capfull of MiraLAX, can repeat untill having regular bowel movements.  Keep this medication out of reach of any children.  zofran (ondansetron) - nausea medication, take 1 tablet every 8 hours as needed for nausea/vomiting.

## 2022-04-19 NOTE — ED Notes (Signed)
Pt to be transported to floor. Stable at time of departure

## 2022-04-19 NOTE — ED Notes (Signed)
EDT Verdis Frederickson to perform EKG in room given pt's HR and statement of epigastric pain.

## 2022-04-19 NOTE — ED Provider Notes (Signed)
   [facility] Provider Note    Event Date/Time   First MD Initiated Contact with Patient 04/19/22 2014     (approximate)   History   Abdominal Pain   HPI  Tammy Decker is a 52 y.o. female who was seen here earlier today, was diagnosed with pancreatitis opted for discharge home with p.o. medications however reports that she has not been able to tolerate p.o. medications because of nausea.  She continues to have significant pain     Physical Exam   Triage Vital Signs: ED Triage Vitals  Enc Vitals Group     BP 04/19/22 2005 (!) 160/100     Pulse Rate 04/19/22 2003 100     Resp 04/19/22 2003 16     Temp 04/19/22 2003 98.2 F (36.8 C)     Temp Source 04/19/22 2003 Oral     SpO2 04/19/22 2003 98 %     Weight 04/19/22 2003 96 kg (211 lb 10.3 oz)     Height 04/19/22 2003 1.702 m (5' 7"$ )     Head Circumference --      Peak Flow --      Pain Score 04/19/22 2003 10     Pain Loc --      Pain Edu? --      Excl. in [place]? --     Most recent vital signs: Vitals:   04/19/22 2003 04/19/22 2005  BP:  (!) 160/100  Pulse: 100   Resp: 16   Temp: 98.2 F (36.8 C)   SpO2: 98%      General: Awake, no distress.  CV:  Good peripheral perfusion.  Resp:  Normal effort.  Abd:  No distention.  Other:     ED Results / Procedures / Treatments   Labs (all labs ordered are listed, but only abnormal results are displayed) Labs Reviewed - No data to display   EKG     RADIOLOGY Reviewed CT scan from earlier today    PROCEDURES:  Critical Care performed:   Procedures   MEDICATIONS ORDERED IN ED: Medications  morphine (PF) 4 MG/ML injection 4 mg (has no administration in time range)  ondansetron (ZOFRAN) injection 4 mg (has no administration in time range)  0.9 %  sodium chloride infusion (has no administration in time range)     IMPRESSION / MDM / ASSESSMENT AND PLAN / ED COURSE  I reviewed the triage vital signs and the nursing  notes. Patient's presentation is most consistent with acute presentation with potential threat to life or bodily function.  Patient presents with epigastric abdominal pain, she has CT scan from earlier today consistent with pancreatitis.  Not tolerating p.o. medications, will require admission for IV pain medications, IV fluids.          FINAL CLINICAL IMPRESSION(S) / ED DIAGNOSES   Final diagnoses:  Acute pancreatitis, unspecified complication status, unspecified pancreatitis type     Rx / DC Orders   ED Discharge Orders     None        Note:  This document was prepared using Dragon voice recognition software and may include unintentional dictation errors.   Lavonia Drafts, MD 04/19/22 2037

## 2022-04-19 NOTE — Assessment & Plan Note (Signed)
Vitals:   04/19/22 2005  BP: (!) 160/100  Cont valsartan at half dose. Prn hydralazine. Metoprolol held.

## 2022-04-19 NOTE — ED Notes (Signed)
Request made for transport to the floor. 

## 2022-04-19 NOTE — Assessment & Plan Note (Signed)
-  Nicotine patch 

## 2022-04-19 NOTE — Assessment & Plan Note (Addendum)
Ethanol level. CIWA.Thiamine. Seizure precaution. Alcohol withdrawal protocol.  IV PPI.

## 2022-04-19 NOTE — ED Provider Notes (Signed)
[facility] Provider Note    Event Date/Time   First MD Initiated Contact with Patient 04/19/22 [phone]     (approximate)   History   Abdominal Pain   HPI  Tammy Decker is a 52 y.o. female past medical history significant for alcohol abuse, history of pancreatitis, presents to the emergency department with abdominal pain and vomiting.  States that her last drink of alcohol was on Sunday where she drank 5 or 6 shots of alcohol.  States that she had a cardiac CT with contrast done on Monday.  States that she has not been feeling well since that time.  Epigastric abdominal pain that wraps around to her back associated with nausea and vomiting.  Denies passing flatus.  No prior history of a bowel obstruction.  Prior cholecystectomy, umbilical hernia surgery and uterine ablation.  Denies any vomiting blood.  Denies any melena.  States that she was drinking fifth of alcohol daily but she has not been drinking that since November when she was admitted for acute pancreatitis.  Endorses drinking less alcohol since November.      Physical Exam   Triage Vital Signs: ED Triage Vitals [04/19/22 0821]  Enc Vitals Group     BP (!) 170/112     Pulse Rate (!) 126     Resp 19     Temp 98.1 F (36.7 C)     Temp src      SpO2 97 %     Weight      Height      Head Circumference      Peak Flow      Pain Score 10     Pain Loc      Pain Edu?      Excl. in [place]?     Most recent vital signs: Vitals:   04/19/22 0930 04/19/22 1000  BP: (!) 150/93 (!) 149/80  Pulse: 99 94  Resp: 15 14  Temp:    SpO2: 98% 97%    Physical Exam Constitutional:      Appearance: She is well-developed.  HENT:     Head: Atraumatic.  Eyes:     Conjunctiva/sclera: Conjunctivae normal.  Cardiovascular:     Rate and Rhythm: Regular rhythm. Tachycardia present.  Pulmonary:     Effort: No respiratory distress.  Abdominal:     General: There is no distension.     Tenderness: There is  generalized abdominal tenderness and tenderness in the epigastric area. There is no guarding. Negative signs include Murphy's sign and McBurney's sign.  Musculoskeletal:        General: Normal range of motion.     Cervical back: Normal range of motion.  Skin:    General: Skin is warm.  Neurological:     Mental Status: She is alert. Mental status is at baseline.     IMPRESSION / MDM / ASSESSMENT AND PLAN / ED COURSE  I reviewed the triage vital signs and the nursing notes.  On chart review of outside records patient has a history of pancreatitis and a prior diagnosis of alcohol induced pancreatitis.  Prior cholecystectomy.  Patient had a CT coronary perfusion study on 2/12 that showed no signs of coronary artery disease.  This was done for patient's persistent tachycardia.  Differential diagnosis including alcohol withdrawal, acute pancreatitis, PUD/gastritis, small bowel obstruction  Reevaluation patient continued to have abdominal pain.  Will obtain CT abdomen and pelvis with contrast to evaluate for small bowel obstruction and  intra-abdominal infectious process.   EKG  I, Nathaniel Man, the attending physician, personally viewed and interpreted this ECG.   Rate: 115  Rhythm: Sinus tachycardia  Axis: Normal  Intervals: Prolonged QTc 518  ST&T Change: None  Sinus tachycardia on telemetry.  RADIOLOGY I independently reviewed imaging, my interpretation of imaging: CT abdomen and pelvis with contrast  LABS (all labs ordered are listed, but only abnormal results are displayed) Labs interpreted as -  Lipase mildly elevated at 198 but does not meet criteria for acute pancreatitis.  No other significant electrolyte abnormalities.  Mildly elevated LFTs.  Normal triglyceride level.  Labs Reviewed  LIPASE, BLOOD - Abnormal; Notable for the following components:      Result Value   Lipase 198 (*)    All other components within normal limits  COMPREHENSIVE METABOLIC PANEL - Abnormal;  Notable for the following components:   Sodium 134 (*)    Glucose, Bld 144 (*)    Calcium 8.6 (*)    AST 75 (*)    ALT 71 (*)    All other components within normal limits  CBC - Abnormal; Notable for the following components:   Hemoglobin 15.7 (*)    HCT 47.0 (*)    MCV 100.9 (*)    All other components within normal limits  TRIGLYCERIDES  URINALYSIS, ROUTINE W REFLEX MICROSCOPIC  URINE DRUG SCREEN, QUALITATIVE ([facility] ONLY)    TREATMENT  1 L of IV fluids, IV ketorolac, IV Ativan for nausea given her prolonged QTc  On reevaluation patient continued to have abdominal pain.  Continues to be tachycardic.  Given another 1 L of IV fluids and IV morphine for pain control.  CT scan ordered.  CT scan with findings consistent with acute pancreatitis that is mild.  No signs of necrosis or intra-abdominal abscess.  Severe hepatic steatosis.  Mild elevation of LFTs which are consistent with her prior elevation.  MDM    On reevaluation patient had significant improvement of her pain.  Tolerating p.o.  States that she wants to try to go home with pain medication and close follow-up with her gastroenterologist and primary care physician.  States that she would return to the emergency department for worsening symptoms.  Will do a short course of opioid pain medication.  Discussed risk and benefits of this medication.  Discussed at length alcohol cessation.   PROCEDURES:  Critical Care performed: No  Procedures  Patient's presentation is most consistent with acute presentation with potential threat to life or bodily function.   MEDICATIONS ORDERED IN ED: Medications  oxyCODONE-acetaminophen (PERCOCET/ROXICET) 5-325 MG per tablet 1 tablet (has no administration in time range)  sodium chloride 0.9 % bolus 1,000 mL (0 mLs Intravenous Stopped 04/19/22 1027)  ketorolac (TORADOL) 15 MG/ML injection 15 mg (15 mg Intravenous Given 04/19/22 0911)  LORazepam (ATIVAN) injection 0.5 mg (0.5 mg  Intravenous Given 04/19/22 0915)  morphine (PF) 4 MG/ML injection 4 mg (4 mg Intravenous Given 04/19/22 1015)  sodium chloride 0.9 % bolus 1,000 mL (1,000 mLs Intravenous New Bag/Given 04/19/22 1027)  iohexol (OMNIPAQUE) 300 MG/ML solution 100 mL (100 mLs Intravenous Contrast Given 04/19/22 1051)    FINAL CLINICAL IMPRESSION(S) / ED DIAGNOSES   Final diagnoses:  Generalized abdominal pain  Nausea and vomiting, unspecified vomiting type  Alcohol-induced acute pancreatitis, unspecified complication status  Hepatic steatosis  Alcohol cessation counseling     Rx / DC Orders   ED Discharge Orders          Ordered  oxyCODONE-acetaminophen (PERCOCET) 5-325 MG tablet  Every 6 hours PRN        04/19/22 1215    ondansetron (ZOFRAN-ODT) 4 MG disintegrating tablet  Every 8 hours PRN,   Status:  Discontinued        04/19/22 1215             Note:  This document was prepared using Dragon voice recognition software and may include unintentional dictation errors.   Nathaniel Man, MD 04/19/22 1217

## 2022-04-19 NOTE — Assessment & Plan Note (Addendum)
2/2 acute pancreatitis 2/2 ETOH. Duodenitis.  PRN pain meds IV. MIVF.

## 2022-04-19 NOTE — Assessment & Plan Note (Signed)
SpO2: 98 % Stable. Prn Albuterol MDI.

## 2022-04-19 NOTE — Assessment & Plan Note (Signed)
PPI ?

## 2022-04-19 NOTE — H&P (Signed)
History and Physical    Chief Complaint: Abdominal pain N/v.   HISTORY OF PRESENT ILLNESS: Tammy Decker is an 52 y.o. female  seen in ed for N/V/ abdominal pain diagnosed with pancreatitis and pt had to leave and came back. Last drink was Sunday , and has been seeing cardiologist for chest pain and palpitation with echo ordered and pending due to F/H or heart disease in dad and sister. PT also has zio patch but I suspect palpitation is from electrolyte specifically mag issue, although pt does need stress test and eval by cardiology. Gi source of chest pain is necessary primary rule out in her case to identify and gerd or PUD .  Pt follows with Dr. Marius Ditch.   Pt has  Past Medical History:  Diagnosis Date   Asthma    Complication of anesthesia    OXYGEN SOMETIMES DROPS LOW   GERD (gastroesophageal reflux disease)    Hypertension    Pyelonephritis    Tachycardia 2020    Review of Systems  Gastrointestinal:  Positive for abdominal distention, abdominal pain, nausea and vomiting.  All other systems reviewed and are negative.  Allergies  Allergen Reactions   Ciprofloxacin Anaphylaxis and Hives   Kiwi Extract Anaphylaxis   Sulfa Antibiotics Hives   Past Surgical History:  Procedure Laterality Date   BACK SURGERY     CHOLECYSTECTOMY     ESOPHAGOGASTRODUODENOSCOPY (EGD) WITH PROPOFOL N/A 06/19/2021   Procedure: ESOPHAGOGASTRODUODENOSCOPY (EGD) WITH PROPOFOL;  Surgeon: Lin Landsman, MD;  Location: [facility];  Service: Gastroenterology;  Laterality: N/A;   NECK SURGERY     uterine ablation     XI ROBOTIC ASSISTED VENTRAL HERNIA N/A 05/09/2020   Procedure: XI ROBOTIC ASSISTED VENTRAL HERNIA, incisional hernia;  Surgeon: Jules Husbands, MD;  Location: [facility] ORS;  Service: General;  Laterality: N/A;       MEDICATIONS: Current Outpatient Medications  Medication Instructions   cyanocobalamin (VITAMIN B12) 1,000 mcg, Oral, Daily   folic acid (FOLVITE) Q000111Q mcg, Oral,  Daily   metoprolol tartrate (LOPRESSOR) 25 mg, Oral, 2 times daily   Omeprazole 20 mg, Oral, See admin instructions, Take 1 tablet (54m) by mouth up to twice daily   oxyCODONE-acetaminophen (PERCOCET) 5-325 MG tablet 1-2 tablets, Oral, Every 6 hours PRN   valsartan (DIOVAN) 320 mg, Oral, Daily     folic acid  1 mg Oral Daily   heparin  5,000 Units Subcutaneous Q12H   irbesartan  75 mg Oral Daily   LORazepam  0-4 mg Intravenous Q6H   Followed by   [Derrill MemoON 04/21/2022] LORazepam  0-4 mg Intravenous Q12H   multivitamin with minerals  1 tablet Oral Daily   nicotine  21 mg Transdermal Daily   pantoprazole (PROTONIX) IV  40 mg Intravenous Q12H   sodium chloride flush  3 mL Intravenous Q12H   thiamine  100 mg Oral Daily   Or   thiamine  100 mg Intravenous Daily    ED Course: Pt in Ed is alert/oriented and afebrile. Vitals:   04/19/22 2003 04/19/22 2005 04/19/22 2156  BP:  (!) 160/100   Pulse: 100  92  Resp: 16    Temp: 98.2 F (36.8 C)    TempSrc: Oral    SpO2: 98%    Weight: 96 kg    Height: 5' 7"$  (1.702 m)    EKG shows sinus tach. Mag ordered and pending.   No intake/output data recorded. SpO2: 98 % Blood work in ed shows: Elevated  lipase and LFT. Lipase 198 and clinically pt present with pancreatitis.  Macrocytosis. Results for orders placed or performed during the hospital encounter of 04/19/22 (from the past 24 hour(s))  Lipid panel     Status: Abnormal   Collection Time: 04/19/22  9:28 PM  Result Value Ref Range   Cholesterol 199 0 - 200 mg/dL   Triglycerides 99 <150 mg/dL   HDL 42 >40 mg/dL   Total CHOL/HDL Ratio 4.7 RATIO   VLDL 20 0 - 40 mg/dL   LDL Cholesterol 137 (H) 0 - 99 mg/dL  Protime-INR     Status: None   Collection Time: 04/19/22  9:28 PM  Result Value Ref Range   Prothrombin Time 14.4 11.4 - 15.2 seconds   INR 1.1 0.8 - 1.2    Unresulted Labs (From admission, onward)     Start     Ordered   04/20/22 0500  Comprehensive metabolic panel   Tomorrow morning,   STAT        04/19/22 2146   04/20/22 0500  CBC  Tomorrow morning,   STAT        04/19/22 2146   04/19/22 2102  Folate  Once,   URGENT        04/19/22 2101   04/19/22 2102  Type and screen  Once,   STAT        04/19/22 2101   04/19/22 2101  Vitamin B12  Once,   URGENT        04/19/22 2101   04/19/22 2100  T4, free  Add-on,   AD        04/19/22 2059   04/19/22 2100  TSH  Add-on,   AD        04/19/22 2059   04/19/22 2059  Hemoglobin A1c  Add-on,   AD        04/19/22 2059   04/19/22 2057  Magnesium  Add-on,   AD        04/19/22 2056   04/19/22 2050  Ethanol  Add-on,   AD        04/19/22 2050           Pt has received : Orders Placed This Encounter  Procedures   Ethanol    Standing Status:   Standing    Number of Occurrences:   1   Lipid panel    Standing Status:   Standing    Number of Occurrences:   1   Magnesium    Standing Status:   Standing    Number of Occurrences:   1   Protime-INR    Standing Status:   Standing    Number of Occurrences:   1   Hemoglobin A1c    Standing Status:   Standing    Number of Occurrences:   1   T4, free    Standing Status:   Standing    Number of Occurrences:   1   TSH    Standing Status:   Standing    Number of Occurrences:   1   Vitamin B12    Standing Status:   Standing    Number of Occurrences:   1   Folate    Standing Status:   Standing    Number of Occurrences:   1   Comprehensive metabolic panel    Standing Status:   Standing    Number of Occurrences:   1   CBC    Standing Status:   Standing  Number of Occurrences:   1   Diet NPO time specified    Standing Status:   Standing    Number of Occurrences:   1   Maintain IV access    Standing Status:   Standing    Number of Occurrences:   1   Vital signs    Standing Status:   Standing    Number of Occurrences:   1   Notify physician (specify)    Standing Status:   Standing    Number of Occurrences:   20    Order Specific Question:   Notify  Physician    Answer:   for pulse less than 55 or greater than 120    Order Specific Question:   Notify Physician    Answer:   for respiratory rate less than 12 or greater than 25    Order Specific Question:   Notify Physician    Answer:   for temperature greater than 100.5 F    Order Specific Question:   Notify Physician    Answer:   for urinary output less than 30 mL/hr for four hours    Order Specific Question:   Notify Physician    Answer:   for systolic BP less than 90 or greater than 0000000, diastolic BP less than 60 or greater than 100    Order Specific Question:   Notify Physician    Answer:   for new hypoxia w/ oxygen saturations < 88%   Progressive Mobility Protocol: No Restrictions    Standing Status:   Standing    Number of Occurrences:   1   Daily weights    Standing Status:   Standing    Number of Occurrences:   1   Intake and Output    Standing Status:   Standing    Number of Occurrences:   1   Initiate Oral Care Protocol    Standing Status:   Standing    Number of Occurrences:   1   Initiate Carrier Fluid Protocol    Standing Status:   Standing    Number of Occurrences:   1   RN may order General Admission PRN Orders utilizing "General Admission PRN medications" (through manage orders) for the following patient needs: allergy symptoms (Claritin), cold sores (Carmex), cough (Robitussin DM), eye irritation (Liquifilm Tears), hemorrhoids (Tucks), indigestion (Maalox), minor skin irritation (Hydrocortisone Cream), muscle pain (Ben Gay), nose irritation (saline nasal spray) and sore throat (Chloraseptic spray).    Standing Status:   Standing    Number of Occurrences:   [id number]   Cardiac Monitoring Continuous x 48 hours Indications for use: Other; Other indications for use: ELECTROLYTE MONITORING.    Standing Status:   Standing    Number of Occurrences:   1    Order Specific Question:   Indications for use:    Answer:   Other    Order Specific Question:   Other indications for  use:    Answer:   ELECTROLYTE MONITORING.   Clinical institute withdrawal assessment    For the full pathway, please use the CIWA order sets. This is a one-time assessment order.    Standing Status:   Standing    Number of Occurrences:   1   Vital signs every 6 hours X 48 hours, then per unit protocol    Standing Status:   Standing    Number of Occurrences:   1   Refer to Sidebar Report for reference: ETOH Withdrawal Guidelines  for reference: ETOH Withdrawal Guidelines    Standing Status:   Standing    Number of Occurrences:   Winnie (CIWA)    Standing Status:   Standing    Number of Occurrences:   66   If Ativan given, reassess Clinical Institute Withdrawal Assessment (CIWA) q 1 hour    Standing Status:   Standing    Number of Occurrences:   1   Notify Pharmacy to change IV Ativan to PO if tolerating POs well.    Standing Status:   Standing    Number of Occurrences:   1   Notify physician (specify)    Standing Status:   Standing    Number of Occurrences:   [phone]    Order Specific Question:   Notify Physician    Answer:   for pulse less than 55 or greater than 120    Order Specific Question:   Notify Physician    Answer:   for respiratory rate less than 12 or greater than 25    Order Specific Question:   Notify Physician    Answer:   for temperature greater than 100.5 F    Order Specific Question:   Notify Physician    Answer:   for urinary output less than 30 mL/hr for four hours    Order Specific Question:   Notify Physician    Answer:   for systolic BP less than 90 or greater than 0000000, diastolic BP less than 60 or greater than 100    Order Specific Question:   Notify Physician    Answer:   if CIWA-AR is greater than 10 for 2 consecutive assessments.   Full code    Standing Status:   Standing    Number of Occurrences:   1    Order Specific Question:   By:    Answer:   Other   Consult to hospitalist    Standing Status:   Standing     Number of Occurrences:   1    Order Specific Question:   Place call to:    Answer:   [phone]    Order Specific Question:   Reason for Consult    Answer:   Admit    Order Specific Question:   Diagnosis/Clinical Info for Consult:    Answer:   pancreatitis   Consult to Transition of Care Team    Standing Status:   Standing    Number of Occurrences:   1    Order Specific Question:   Reason for Consult:    Answer:   Substance Abuse Couseling/Education- please comment   Pulse oximetry check with vital signs    Standing Status:   Standing    Number of Occurrences:   1   Oxygen therapy Mode or (Route): Nasal cannula; Liters Per Minute: 2; Keep 02 saturation: greater than 92 %    Standing Status:   Standing    Number of Occurrences:   20    Order Specific Question:   Mode or (Route)    Answer:   Nasal cannula    Order Specific Question:   Liters Per Minute    Answer:   2    Order Specific Question:   Keep 02 saturation    Answer:   greater than 92 %   Type and screen    Standing Status:   Standing    Number of Occurrences:   1   Insert peripheral IV  Standing Status:   Standing    Number of Occurrences:   1   Admit to Inpatient (patient's expected length of stay will be greater than 2 midnights or inpatient only procedure)    ABDOMINAL PAIN. ACUTE PANCREATITIS. ELECTROLYTE ABNORMALITY.    Standing Status:   Standing    Number of Occurrences:   1    Order Specific Question:   Hospital Area    Answer:   [facility] [[id number]]    Order Specific Question:   Level of Care    Answer:   Telemetry Medical [104]    Order Specific Question:   Covid Evaluation    Answer:   Asymptomatic - no recent exposure (last 10 days) testing not required    Order Specific Question:   Diagnosis    Answer:   Nausea and vomiting [744752]    Order Specific Question:   Admitting Physician    Answer:   Cherylann Ratel    Order Specific Question:   Attending Physician    Answer:    Cherylann Ratel    Order Specific Question:   Certification:    Answer:   I certify this patient will need inpatient services for at least 2 midnights    Order Specific Question:   Estimated Length of Stay    Answer:   2   Aspiration precautions    Standing Status:   Standing    Number of Occurrences:   1    Meds ordered this encounter  Medications   morphine (PF) 4 MG/ML injection 4 mg   ondansetron (ZOFRAN) injection 4 mg   0.9 %  sodium chloride infusion   pantoprazole (PROTONIX) injection 40 mg   irbesartan (AVAPRO) tablet 75 mg   heparin injection 5,000 Units   sodium chloride flush (NS) 0.9 % injection 3 mL   OR Linked Order Group    acetaminophen (TYLENOL) tablet 650 mg    acetaminophen (TYLENOL) suppository 650 mg   morphine (PF) 2 MG/ML injection 2 mg   OR Linked Order Group    thiamine (VITAMIN B1) tablet 100 mg    thiamine (VITAMIN B1) injection 123XX123 mg   folic acid (FOLVITE) tablet 1 mg   multivitamin with minerals tablet 1 tablet   lactated ringers infusion   OR Linked Order Group    ondansetron (ZOFRAN) tablet 4 mg    ondansetron (ZOFRAN) injection 4 mg   nicotine (NICODERM CQ - dosed in mg/24 hours) patch 21 mg   FOLLOWED BY Linked Order Group    LORazepam (ATIVAN) injection 0-4 mg     Order Specific Question:   CIWA-AR < 5 =     Answer:   0 mg     Order Specific Question:   CIWA-AR 5 -10 =     Answer:   1 mg     Order Specific Question:   CIWA-AR 11 -15 =     Answer:   2 mg     Order Specific Question:   CIWA-AR 16 -20 =     Answer:   3 mg     Order Specific Question:   CIWA-AR 16 -20 =     Answer:   Recheck CIWA-AR in 1 hour; if > 20 notify MD     Order Specific Question:   CIWA-AR > 20 =     Answer:   4 mg     Order Specific Question:   CIWA-AR > 20 =  Answer:   Call Rapid Response    LORazepam (ATIVAN) injection 0-4 mg     Order Specific Question:   CIWA-AR < 5 =     Answer:   0 mg     Order Specific Question:   CIWA-AR 5 -10 =      Answer:   1 mg     Order Specific Question:   CIWA-AR 11 -15 =     Answer:   2 mg     Order Specific Question:   CIWA-AR 16 -20 =     Answer:   3 mg     Order Specific Question:   CIWA-AR 16 -20 =     Answer:   Recheck CIWA-AR in 1 hour; if > 20 notify MD     Order Specific Question:   CIWA-AR > 20 =     Answer:   4 mg     Order Specific Question:   CIWA-AR > 20 =     Answer:   Call Rapid Response     Admission Imaging : CT ABDOMEN PELVIS W CONTRAST  Result Date: 04/19/2022 CLINICAL DATA:  Abdominal pain and epigastric pain with nausea. EXAM: CT ABDOMEN AND PELVIS WITH CONTRAST TECHNIQUE: Multidetector CT imaging of the abdomen and pelvis was performed using the standard protocol following bolus administration of intravenous contrast. RADIATION DOSE REDUCTION: This exam was performed according to the departmental dose-optimization program which includes automated exposure control, adjustment of the mA and/or kV according to patient size and/or use of iterative reconstruction technique. CONTRAST:  169m OMNIPAQUE IOHEXOL 300 MG/ML  SOLN COMPARISON:  April 01, 2022 FINDINGS: Lower chest: Unremarkable to the extent evaluated. Hepatobiliary: Severe hepatic steatosis and lobular hepatic contours with fissural widening. Hepatomegaly approximately 22 cm greatest craniocaudal extent. No focal, suspicious hepatic lesion. Portal vein is patent. Post cholecystectomy without biliary duct dilation. Pancreas: Inflammation about the head of the pancreas greater than other areas of the pancreas. Mild stranding about the body as well. No focal peripancreatic fluid collection. No pancreatic ductal dilation. Spleen: Normal. Adrenals/Urinary Tract: Adrenal glands are normal. Symmetric renal enhancement without hydronephrosis, perinephric stranding or suspicious renal lesion. Nonobstructing punctate calculi approximately 2-3 calculi in the lower pole the RIGHT kidney. Stomach/Bowel: Secondary inflammation of the  duodenum is suspected. No sign of small bowel obstruction. No stranding about small bowel elsewhere. Normal appendix. No signs of colonic dilation or inflammation. Vascular/Lymphatic: Aortic atherosclerosis. No sign of aneurysm. Smooth contour of the IVC. There is no gastrohepatic or hepatoduodenal ligament lymphadenopathy. No retroperitoneal or mesenteric lymphadenopathy. No pelvic sidewall lymphadenopathy. Mild atherosclerotic changes of the abdominal aorta. Reproductive: Unremarkable by CT. Other: Postoperative changes in the mid abdomen. Enhancing tissue just to the RIGHT of the midline in the subcutaneous fat compatible with granulation and sequela of prior fat necrosis in this area. No ascites. Musculoskeletal: No acute bone finding. No destructive bone process. Spinal degenerative changes. Signs of spinal nerve stimulator terminating in the lower thoracic spine. Power pack over the RIGHT gluteal region/lower back.  IMPRESSION: 1. Findings of mild acute pancreatitis without focal peripancreatic fluid collection. Secondary duodenal inflammation and is favored over primary duodenitis. Correlate with pancreatic enzymes. Inflammation in the pancreatic-duodenal groove is improved since previous imaging. 2. Hepatomegaly with severe hepatic steatosis and fissural widening of hepatic fissures. Correlate with any clinical or laboratory evidence of hepatitis/liver disease. 3. Post cholecystectomy without biliary duct dilation. 4. Nonobstructing punctate calculi approximately 2-3 calculi in the lower pole the RIGHT kidney. 5. Mild  aortic atherosclerosis. Aortic Atherosclerosis (ICD10-I70.0). Electronically Signed   By: Zetta Bills M.D.   On: 04/19/2022 11:36      Physical Examination: Vitals:   04/19/22 2003 04/19/22 2005 04/19/22 2156  BP:  (!) 160/100   Pulse: 100  92  Temp: 98.2 F (36.8 C)    Resp: 16    Height: 5' 7"$  (1.702 m)    Weight: 96 kg    SpO2: 98%    TempSrc: Oral    BMI (Calculated):  33.14     Physical Exam Vitals and nursing note reviewed.  Constitutional:      General: She is not in acute distress.    Appearance: Normal appearance. She is not ill-appearing, toxic-appearing or diaphoretic.  HENT:     Head: Normocephalic and atraumatic.     Right Ear: Hearing and external ear normal.     Left Ear: Hearing and external ear normal.     Nose: Nose normal. No nasal deformity.     Mouth/Throat:     Lips: Pink.     Mouth: Mucous membranes are moist.     Tongue: No lesions.     Pharynx: Oropharynx is clear.  Eyes:     Extraocular Movements: Extraocular movements intact.  Cardiovascular:     Rate and Rhythm: Normal rate and regular rhythm.     Pulses: Normal pulses.     Heart sounds: Normal heart sounds.  Pulmonary:     Effort: Pulmonary effort is normal.     Breath sounds: Normal breath sounds.  Abdominal:     General: Bowel sounds are normal. There is distension.     Palpations: Abdomen is soft. There is no mass.     Tenderness: There is abdominal tenderness. There is no guarding.     Hernia: No hernia is present.  Musculoskeletal:     Right lower leg: No edema.     Left lower leg: No edema.  Skin:    General: Skin is warm.  Neurological:     General: No focal deficit present.     Mental Status: She is alert and oriented to person, place, and time.     Cranial Nerves: Cranial nerves 2-12 are intact.     Motor: Motor function is intact.  Psychiatric:        Attention and Perception: Attention normal.        Mood and Affect: Mood normal.        Speech: Speech normal.        Behavior: Behavior normal. Behavior is cooperative.        Cognition and Memory: Cognition normal.       Assessment and Plan: * Nausea & vomiting IV PPI. PRN zofran.    Abdominal pain 2/2 acute pancreatitis 2/2 ETOH. Duodenitis.  PRN pain meds IV. MIVF.   Alcoholic pancreatitis Ethanol level. CIWA.Thiamine. Seizure precaution. Alcohol withdrawal protocol.  IV PPI.    Essential hypertension Vitals:   04/19/22 2005  BP: (!) 160/100  Cont valsartan at half dose. Prn hydralazine. Metoprolol held.    Asthma SpO2: 98 % Stable. Prn Albuterol MDI.   Abnormal LFTs 2/2 etoh abuse and fatty liver.  Pt has h./o cholecystectomy.  RUQ usg per AM team and GI consult per Am team as needed.    Tobacco abuse Nicotine patch.   MDD (major depressive disorder), recurrent episode, moderate ([place]) Resume home meds once med rec is available.   Hyperglycemia A1c.   Anxiety and depression No meds currently.  Chronic GERD IV PPI.     DVT prophylaxis:  Heparin  Code Status:  Full Code    Family Communication:  Philbrick,Travis (Spouse)  [phone]   Disposition Plan:  Home   Consults called:  none Admission status: Inpatient.    Unit/ Expected LOS: Med tele / 2 days.    Para Skeans MD [facility]  6 PM- 2 AM. Please contact me via secure Chat 6 PM-2 AM. [phone]( Pager ) To contact the [facility] Attending or Consulting provider [place] or covering provider during after hours [place], for this patient.   Check the care team in [facility] and look for a) attending/consulting [facility] provider listed and b) the [facility] team listed Log into [email or web address] and use [facility] universal password to access. If you do not have the password, please contact the hospital operator. Locate the [facility] provider you are looking for under [facility] and page to a number that you can be directly reached. If you still have difficulty reaching the provider, please page the [facility] (Director on Call) for the Hospitalists listed on amion for assistance. [email or web address] 04/19/2022, 10:24 PM

## 2022-04-19 NOTE — Assessment & Plan Note (Addendum)
No meds currently

## 2022-04-19 NOTE — ED Notes (Signed)
Pt states that she has not been drinking alcohol daily since November.

## 2022-04-20 DIAGNOSIS — R1114 Bilious vomiting: Secondary | ICD-10-CM | POA: Diagnosis not present

## 2022-04-20 LAB — VITAMIN B12: Vitamin B-12: 229 pg/mL (ref 180–914)

## 2022-04-20 LAB — COMPREHENSIVE METABOLIC PANEL
ALT: 67 U/L — ABNORMAL HIGH (ref 0–44)
AST: 96 U/L — ABNORMAL HIGH (ref 15–41)
Albumin: 3.3 g/dL — ABNORMAL LOW (ref 3.5–5.0)
Alkaline Phosphatase: 86 U/L (ref 38–126)
Anion gap: 7 (ref 5–15)
BUN: 7 mg/dL (ref 6–20)
CO2: 25 mmol/L (ref 22–32)
Calcium: 8.2 mg/dL — ABNORMAL LOW (ref 8.9–10.3)
Chloride: 103 mmol/L (ref 98–111)
Creatinine, Ser: 0.48 mg/dL (ref 0.44–1.00)
GFR, Estimated: >60 mL/min (ref >60)
Glucose, Bld: 122 mg/dL — ABNORMAL HIGH (ref 70–99)
Potassium: 3.5 mmol/L (ref 3.5–5.1)
Sodium: 135 mmol/L (ref 135–145)
Total Bilirubin: 0.9 mg/dL (ref 0.3–1.2)
Total Protein: 6.9 g/dL (ref 6.5–8.1)

## 2022-04-20 LAB — CBC
HCT: 40.9 % (ref 36.0–46.0)
Hemoglobin: 13.5 g/dL (ref 12.0–15.0)
MCH: 33.7 pg (ref 26.0–34.0)
MCHC: 33 g/dL (ref 30.0–36.0)
MCV: 102 fL — ABNORMAL HIGH (ref 80.0–100.0)
Platelets: 231 10*3/uL (ref 150–400)
RBC: 4.01 MIL/uL (ref 3.87–5.11)
RDW: 13.9 % (ref 11.5–15.5)
WBC: 8.1 10*3/uL (ref 4.0–10.5)
nRBC: 0 % (ref 0.0–0.2)

## 2022-04-20 LAB — TYPE AND SCREEN
ABO/RH(D): O POS
Antibody Screen: NEGATIVE

## 2022-04-20 LAB — HEMOGLOBIN A1C
Hgb A1c MFr Bld: 5.8 % — ABNORMAL HIGH (ref 4.8–5.6)
Mean Plasma Glucose: 119.76 mg/dL

## 2022-04-20 MED ORDER — METOPROLOL TARTRATE 25 MG PO TABS
25.0000 mg | ORAL_TABLET | Freq: Two times a day (BID) | ORAL | Status: DC
  Administered 2022-04-20 – 2022-04-22 (×5): 25 mg via ORAL
  Filled 2022-04-20 (×5): qty 1

## 2022-04-20 NOTE — Progress Notes (Signed)
PROGRESS NOTE    Tammy Decker  [id number] DOB: 1970-03-12 DOA: 04/19/2022 PCP: Eugenia Pancoast, FNP   Brief Narrative:  Tammy Decker is an 52 y.o. female  seen in ed for N/V/ abdominal pain diagnosed with pancreatitis and pt had to leave and came back. Last drink was Sunday , and has been seeing cardiologist for chest pain and palpitation with echo ordered and pending due to F/H or heart disease in dad and sister. PT also has zio patch but I suspect palpitation is from electrolyte specifically mag issue, although pt does need stress test and eval by cardiology. Gi source of chest pain is necessary primary rule out in her case to identify and gerd or PUD .  Pt follows with Dr. Marius Ditch.    Assessment & Plan:   Principal Problem:   Nausea & vomiting Active Problems:   Abdominal pain   Essential hypertension   Alcoholic pancreatitis   Asthma   Abnormal LFTs   Tobacco abuse   Chronic GERD   Anxiety and depression   Hyperglycemia   MDD (major depressive disorder), recurrent episode, moderate (HCC)   Nausea and vomiting  Acute alcoholic pancreatitis: Lipase elevated up to 200.  Patient's abdominal pain is improving and is now intermittent.  She has some nausea but no vomiting since last night.  She is comfortable plan clear liquid diet so I will start her on diet and advance to full liquid diet later today.  She has history of cholecystectomy so no need for abdominal ultrasound.  No need for GI as well.  Continue symptomatic treatment and IV fluids.  Essential hypertension: Blood pressure controlled.  Continue current medications/Avapro.  Acute duodenitis: Likely due to stress secondary to pancreatitis as well as alcoholism.  Continue PPI.  Elevated LFTs/alcoholic liver disease: Elevated LFTs consistent with chronic liver disease, slight rise in LFTs compared to yesterday.  Will repeat in the morning.  DVT prophylaxis: heparin injection 5,000 Units Start: 04/19/22 2200    Code Status: Full Code  Family Communication:  None present at bedside.  Plan of care discussed with patient in length and he/she verbalized understanding and agreed with it.  Status is: Inpatient Remains inpatient appropriate because: Still symptomatic, will need slow advancement of the diet.   Estimated body mass index is 33.15 kg/m as calculated from the following:   Height as of this encounter: 5' 7"$  (1.702 m).   Weight as of this encounter: 96 kg.    Nutritional Assessment: Body mass index is 33.15 kg/m.Marland Kitchen Seen by dietician.  I agree with the assessment and plan as outlined below: Nutrition Status:        . Skin Assessment: I have examined the patient's skin and I agree with the wound assessment as performed by the wound care RN as outlined below:    Consultants:  None  Procedures:  None  Antimicrobials:  Anti-infectives (From admission, onward)    None         Subjective: Patient seen and examined.  She says that she is slightly better than yesterday, still with abdominal pain but improved in intensity and frequency.  Objective: Vitals:   04/19/22 2300 04/19/22 2342 04/20/22 0442 04/20/22 0809  BP: 126/82 (!) 146/93 128/74 135/80  Pulse: 91 96 76 86  Resp: 16 17 17 18  $ Temp: 98.1 F (36.7 C) 98.4 F (36.9 C) 98.3 F (36.8 C) 98.5 F (36.9 C)  TempSrc: Oral     SpO2: 93% 97% 97% 95%  Weight:  Height:        Intake/Output Summary (Last 24 hours) at 04/20/2022 0945 Last data filed at 04/19/2022 2236 Gross per 24 hour  Intake 1000 ml  Output --  Net 1000 ml   Filed Weights   04/19/22 2003  Weight: 96 kg    Examination:  General exam: Appears calm and comfortable  Respiratory system: Clear to auscultation. Respiratory effort normal. Cardiovascular system: S1 & S2 heard, RRR. No JVD, murmurs, rubs, gallops or clicks. No pedal edema. Gastrointestinal system: Abdomen is nondistended, soft and mild periumbilical tenderness, no epigastric or  left and right upper quadrant tenderness. No organomegaly or masses felt. Normal bowel sounds heard. Central nervous system: Alert and oriented. No focal neurological deficits. Extremities: Symmetric 5 x 5 power. Skin: No rashes, lesions or ulcers Psychiatry: Judgement and insight appear normal. Mood & affect appropriate.    Data Reviewed: I have personally reviewed following labs and imaging studies  CBC: Recent Labs  Lab 04/19/22 0918 04/20/22 0424  WBC 9.7 8.1  HGB 15.7* 13.5  HCT 47.0* 40.9  MCV 100.9* 102.0*  PLT 285 [id number]   Basic Metabolic Panel: Recent Labs  Lab 04/19/22 0918 04/19/22 2128 04/20/22 0424  NA 134*  --  135  K 3.6  --  3.5  CL 102  --  103  CO2 25  --  25  GLUCOSE 144*  --  122*  BUN 8  --  7  CREATININE 0.45  --  0.48  CALCIUM 8.6*  --  8.2*  MG  --  1.8  --    GFR: Estimated Creatinine Clearance: 100.1 mL/min (by C-G formula based on SCr of 0.48 mg/dL). Liver Function Tests: Recent Labs  Lab 04/19/22 0918 04/20/22 0424  AST 75* 96*  ALT 71* 67*  ALKPHOS 95 86  BILITOT 0.7 0.9  PROT 8.0 6.9  ALBUMIN 3.7 3.3*   Recent Labs  Lab 04/19/22 0918  LIPASE 198*   No results for input(s): "AMMONIA" in the last 168 hours. Coagulation Profile: Recent Labs  Lab 04/19/22 2128  INR 1.1   Cardiac Enzymes: No results for input(s): "CKTOTAL", "CKMB", "CKMBINDEX", "TROPONINI" in the last 168 hours. BNP (last 3 results) No results for input(s): "PROBNP" in the last 8760 hours. HbA1C: No results for input(s): "HGBA1C" in the last 72 hours. CBG: No results for input(s): "GLUCAP" in the last 168 hours. Lipid Profile: Recent Labs    04/19/22 0918 04/19/22 2128  CHOL  --  199  HDL  --  42  LDLCALC  --  137*  TRIG 65 99  CHOLHDL  --  4.7   Thyroid Function Tests: Recent Labs    04/19/22 2128  TSH 12.110*  FREET4 0.85   Anemia Panel: Recent Labs    04/19/22 2128  VITAMINB12 229  FOLATE 10.7   Sepsis Labs: No results for  input(s): "PROCALCITON", "LATICACIDVEN" in the last 168 hours.  No results found for this or any previous visit (from the past 240 hour(s)).   Radiology Studies: CT ABDOMEN PELVIS W CONTRAST  Result Date: 04/19/2022 CLINICAL DATA:  Abdominal pain and epigastric pain with nausea. EXAM: CT ABDOMEN AND PELVIS WITH CONTRAST TECHNIQUE: Multidetector CT imaging of the abdomen and pelvis was performed using the standard protocol following bolus administration of intravenous contrast. RADIATION DOSE REDUCTION: This exam was performed according to the departmental dose-optimization program which includes automated exposure control, adjustment of the mA and/or kV according to patient size and/or use of iterative reconstruction technique.  CONTRAST:  144m OMNIPAQUE IOHEXOL 300 MG/ML  SOLN COMPARISON:  April 01, 2022 FINDINGS: Lower chest: Unremarkable to the extent evaluated. Hepatobiliary: Severe hepatic steatosis and lobular hepatic contours with fissural widening. Hepatomegaly approximately 22 cm greatest craniocaudal extent. No focal, suspicious hepatic lesion. Portal vein is patent. Post cholecystectomy without biliary duct dilation. Pancreas: Inflammation about the head of the pancreas greater than other areas of the pancreas. Mild stranding about the body as well. No focal peripancreatic fluid collection. No pancreatic ductal dilation. Spleen: Normal. Adrenals/Urinary Tract: Adrenal glands are normal. Symmetric renal enhancement without hydronephrosis, perinephric stranding or suspicious renal lesion. Nonobstructing punctate calculi approximately 2-3 calculi in the lower pole the RIGHT kidney. Stomach/Bowel: Secondary inflammation of the duodenum is suspected. No sign of small bowel obstruction. No stranding about small bowel elsewhere. Normal appendix. No signs of colonic dilation or inflammation. Vascular/Lymphatic: Aortic atherosclerosis. No sign of aneurysm. Smooth contour of the IVC. There is no  gastrohepatic or hepatoduodenal ligament lymphadenopathy. No retroperitoneal or mesenteric lymphadenopathy. No pelvic sidewall lymphadenopathy. Mild atherosclerotic changes of the abdominal aorta. Reproductive: Unremarkable by CT. Other: Postoperative changes in the mid abdomen. Enhancing tissue just to the RIGHT of the midline in the subcutaneous fat compatible with granulation and sequela of prior fat necrosis in this area. No ascites. Musculoskeletal: No acute bone finding. No destructive bone process. Spinal degenerative changes. Signs of spinal nerve stimulator terminating in the lower thoracic spine. Power pack over the RIGHT gluteal region/lower back. IMPRESSION: 1. Findings of mild acute pancreatitis without focal peripancreatic fluid collection. Secondary duodenal inflammation and is favored over primary duodenitis. Correlate with pancreatic enzymes. Inflammation in the pancreatic-duodenal groove is improved since previous imaging. 2. Hepatomegaly with severe hepatic steatosis and fissural widening of hepatic fissures. Correlate with any clinical or laboratory evidence of hepatitis/liver disease. 3. Post cholecystectomy without biliary duct dilation. 4. Nonobstructing punctate calculi approximately 2-3 calculi in the lower pole the RIGHT kidney. 5. Mild aortic atherosclerosis. Aortic Atherosclerosis (ICD10-I70.0). Electronically Signed   By: GZetta BillsM.D.   On: 04/19/2022 11:36    Scheduled Meds:  folic acid  1 mg Oral Daily   heparin  5,000 Units Subcutaneous Q12H   irbesartan  75 mg Oral Daily   multivitamin with minerals  1 tablet Oral Daily   nicotine  21 mg Transdermal Daily   pantoprazole (PROTONIX) IV  40 mg Intravenous Q12H   sodium chloride flush  3 mL Intravenous Q12H   thiamine  100 mg Oral Daily   Or   thiamine  100 mg Intravenous Daily   Continuous Infusions:  lactated ringers 75 mL/hr at 04/19/22 2236     LOS: 1 day   RDarliss Cheney MD [facility]  04/20/2022,  9:45 AM   *Please note that this is a verbal dictation therefore any spelling or grammatical errors are due to the "DAnnexOne" system interpretation.  Please page via ABur[place]and do not message via secure chat for urgent patient care matters. Secure chat can be used for non urgent patient care matters.  How to contact the TWillow Springs CenterAttending or Consulting provider 7[place] Estatesor covering provider during after hours [place] for this patient?  Check the care team in CThe [facility] Institute Surgery Ctrand look for a) attending/consulting TRH provider listed and b) the TChrist[facility] Countyteam listed. Page or secure chat [phone]-[id number]. Log into [email or web address] and use [place]'s universal password to access. If you do not have the password, please contact the hospital operator. Locate the TNorwalk Surgery Center LLCprovider you  are looking for under [facility] and page to a number that you can be directly reached. If you still have difficulty reaching the provider, please page the [facility] (Director on Call) for the Hospitalists listed on amion for assistance.

## 2022-04-20 NOTE — Progress Notes (Signed)
Patient reports to this nurse she is unable to tolerate clear liquid diet at this time and pain to abdomen is 8/10. Dr. Doristine Bosworth made aware and advised this nurse to give Dilaudid early (see eMAR). Patient remains on clear liquid diet at this time.

## 2022-04-20 NOTE — Progress Notes (Signed)
       CROSS COVER NOTE  NAME: Tammy Decker MRN: [id number] DOB : August 13, 1970    HPI/Events of Note   Patient admitted with abd pain, diagnosed with pancreatitis. Nurse states patient reports no improvement in pain aftr morphine doses   Assessment and  Interventions   Assessment:  Plan: D/c morphine. Hydromorphone 0.5 mg every 3h prn severe pain     Kathlene Cote NP [facility]

## 2022-04-21 DIAGNOSIS — R1114 Bilious vomiting: Secondary | ICD-10-CM | POA: Diagnosis not present

## 2022-04-21 LAB — COMPREHENSIVE METABOLIC PANEL
ALT: 63 U/L — ABNORMAL HIGH (ref 0–44)
AST: 70 U/L — ABNORMAL HIGH (ref 15–41)
Albumin: 3.4 g/dL — ABNORMAL LOW (ref 3.5–5.0)
Alkaline Phosphatase: 88 U/L (ref 38–126)
Anion gap: 7 (ref 5–15)
BUN: <5 mg/dL — ABNORMAL LOW (ref 6–20)
CO2: 24 mmol/L (ref 22–32)
Calcium: 8.6 mg/dL — ABNORMAL LOW (ref 8.9–10.3)
Chloride: 104 mmol/L (ref 98–111)
Creatinine, Ser: 0.51 mg/dL (ref 0.44–1.00)
GFR, Estimated: >60 mL/min (ref >60)
Glucose, Bld: 120 mg/dL — ABNORMAL HIGH (ref 70–99)
Potassium: 3.7 mmol/L (ref 3.5–5.1)
Sodium: 135 mmol/L (ref 135–145)
Total Bilirubin: 1.1 mg/dL (ref 0.3–1.2)
Total Protein: 6.9 g/dL (ref 6.5–8.1)

## 2022-04-21 MED ORDER — ZOLPIDEM TARTRATE 5 MG PO TABS
5.0000 mg | ORAL_TABLET | Freq: Every evening | ORAL | Status: DC | PRN
  Filled 2022-04-21: qty 1

## 2022-04-21 NOTE — Progress Notes (Signed)
PROGRESS NOTE    Tammy Decker  [id number] DOB: 05-17-1970 DOA: 04/19/2022 PCP: Eugenia Pancoast, FNP   Brief Narrative:  Tammy Decker is an 52 y.o. female  seen in ed for N/V/ abdominal pain diagnosed with pancreatitis and pt had to leave and came back. Last drink was Sunday , and has been seeing cardiologist for chest pain and palpitation with echo ordered and pending due to F/H or heart disease in dad and sister. PT also has zio patch but I suspect palpitation is from electrolyte specifically mag issue, although pt does need stress test and eval by cardiology. Gi source of chest pain is necessary primary rule out in her case to identify and gerd or PUD .  Pt follows with Dr. Marius Ditch.    Assessment & Plan:   Principal Problem:   Nausea & vomiting Active Problems:   Abdominal pain   Essential hypertension   Alcoholic pancreatitis   Asthma   Abnormal LFTs   Tobacco abuse   Chronic GERD   Anxiety and depression   Hyperglycemia   MDD (major depressive disorder), recurrent episode, moderate (HCC)   Nausea and vomiting  Acute alcoholic pancreatitis: Lipase elevated up to 200.  Some improvement in abdominal pain.  She is now eating breakfast and trying to see how she does with full liquid diet.  Will advance to soft diet in the afternoon if she does well.  Essential hypertension: Blood pressure controlled.  Continue current medications/Avapro.  Acute duodenitis: Likely due to stress secondary to pancreatitis as well as alcoholism.  Continue PPI.  Elevated LFTs/alcoholic liver disease: Elevated LFTs consistent with chronic liver disease, improving now.  Insomnia: She is requesting something for sleep.  Will order as needed Ambien 5 mg nightly.  DVT prophylaxis: heparin injection 5,000 Units Start: 04/19/22 2200   Code Status: Full Code  Family Communication:  None present at bedside.  Plan of care discussed with patient in length and he/she verbalized understanding and  agreed with it.  Status is: Inpatient Remains inpatient appropriate because: Still symptomatic, will need slow advancement of the diet.   Estimated body mass index is 33.15 kg/m as calculated from the following:   Height as of this encounter: 5' 7"$  (1.702 m).   Weight as of this encounter: 96 kg.    Nutritional Assessment: Body mass index is 33.15 kg/m.Marland Kitchen Seen by dietician.  I agree with the assessment and plan as outlined below: Nutrition Status:        . Skin Assessment: I have examined the patient's skin and I agree with the wound assessment as performed by the wound care RN as outlined below:    Consultants:  None  Procedures:  None  Antimicrobials:  Anti-infectives (From admission, onward)    None         Subjective:  Seen and examined.  She says that her abdominal pain and nausea is improving.  Her boyfriend is at the bedside.  Objective: Vitals:   04/20/22 1912 04/20/22 2129 04/21/22 0344 04/21/22 0836  BP: (!) 149/74 (!) 151/82 (!) 162/90 134/87  Pulse: 86 83 87 75  Resp: 16  16 17  $ Temp: 98 F (36.7 C)  97.8 F (36.6 C) 98 F (36.7 C)  TempSrc: Oral  Oral Oral  SpO2: 99%  97% 96%  Weight:      Height:        Intake/Output Summary (Last 24 hours) at 04/21/2022 0940 Last data filed at 04/21/2022 0557 Gross per 24 hour  Intake 1434.26 ml  Output --  Net 1434.26 ml    Filed Weights   04/19/22 2003  Weight: 96 kg    Examination:  General exam: Appears calm and comfortable  Respiratory system: Clear to auscultation. Respiratory effort normal. Cardiovascular system: S1 & S2 heard, RRR. No JVD, murmurs, rubs, gallops or clicks. No pedal edema. Gastrointestinal system: Abdomen is nondistended, soft and very minimal periumbilical tenderness. No organomegaly or masses felt. Normal bowel sounds heard. Central nervous system: Alert and oriented. No focal neurological deficits. Extremities: Symmetric 5 x 5 power. Skin: No rashes, lesions or  ulcers.  Psychiatry: Judgement and insight appear normal. Mood & affect appropriate.    Data Reviewed: I have personally reviewed following labs and imaging studies  CBC: Recent Labs  Lab 04/19/22 0918 04/20/22 0424  WBC 9.7 8.1  HGB 15.7* 13.5  HCT 47.0* 40.9  MCV 100.9* 102.0*  PLT 285 [id number]    Basic Metabolic Panel: Recent Labs  Lab 04/19/22 0918 04/19/22 2128 04/20/22 0424 04/21/22 0458  NA 134*  --  135 135  K 3.6  --  3.5 3.7  CL 102  --  103 104  CO2 25  --  25 24  GLUCOSE 144*  --  122* 120*  BUN 8  --  7 <5*  CREATININE 0.45  --  0.48 0.51  CALCIUM 8.6*  --  8.2* 8.6*  MG  --  1.8  --   --     GFR: Estimated Creatinine Clearance: 100.1 mL/min (by C-G formula based on SCr of 0.51 mg/dL). Liver Function Tests: Recent Labs  Lab 04/19/22 0918 04/20/22 0424 04/21/22 0458  AST 75* 96* 70*  ALT 71* 67* 63*  ALKPHOS 95 86 88  BILITOT 0.7 0.9 1.1  PROT 8.0 6.9 6.9  ALBUMIN 3.7 3.3* 3.4*    Recent Labs  Lab 04/19/22 0918  LIPASE 198*    No results for input(s): "AMMONIA" in the last 168 hours. Coagulation Profile: Recent Labs  Lab 04/19/22 2128  INR 1.1    Cardiac Enzymes: No results for input(s): "CKTOTAL", "CKMB", "CKMBINDEX", "TROPONINI" in the last 168 hours. BNP (last 3 results) No results for input(s): "PROBNP" in the last 8760 hours. HbA1C: Recent Labs    04/19/22 0918  HGBA1C 5.8*   CBG: No results for input(s): "GLUCAP" in the last 168 hours. Lipid Profile: Recent Labs    04/19/22 0918 04/19/22 2128  CHOL  --  199  HDL  --  42  LDLCALC  --  137*  TRIG 65 99  CHOLHDL  --  4.7    Thyroid Function Tests: Recent Labs    04/19/22 2128  TSH 12.110*  FREET4 0.85    Anemia Panel: Recent Labs    04/19/22 2128  VITAMINB12 229  FOLATE 10.7    Sepsis Labs: No results for input(s): "PROCALCITON", "LATICACIDVEN" in the last 168 hours.  No results found for this or any previous visit (from the past 240 hour(s)).    Radiology Studies: CT ABDOMEN PELVIS W CONTRAST  Result Date: 04/19/2022 CLINICAL DATA:  Abdominal pain and epigastric pain with nausea. EXAM: CT ABDOMEN AND PELVIS WITH CONTRAST TECHNIQUE: Multidetector CT imaging of the abdomen and pelvis was performed using the standard protocol following bolus administration of intravenous contrast. RADIATION DOSE REDUCTION: This exam was performed according to the departmental dose-optimization program which includes automated exposure control, adjustment of the mA and/or kV according to patient size and/or use of iterative reconstruction technique. CONTRAST:  179m OMNIPAQUE IOHEXOL 300 MG/ML  SOLN COMPARISON:  April 01, 2022 FINDINGS: Lower chest: Unremarkable to the extent evaluated. Hepatobiliary: Severe hepatic steatosis and lobular hepatic contours with fissural widening. Hepatomegaly approximately 22 cm greatest craniocaudal extent. No focal, suspicious hepatic lesion. Portal vein is patent. Post cholecystectomy without biliary duct dilation. Pancreas: Inflammation about the head of the pancreas greater than other areas of the pancreas. Mild stranding about the body as well. No focal peripancreatic fluid collection. No pancreatic ductal dilation. Spleen: Normal. Adrenals/Urinary Tract: Adrenal glands are normal. Symmetric renal enhancement without hydronephrosis, perinephric stranding or suspicious renal lesion. Nonobstructing punctate calculi approximately 2-3 calculi in the lower pole the RIGHT kidney. Stomach/Bowel: Secondary inflammation of the duodenum is suspected. No sign of small bowel obstruction. No stranding about small bowel elsewhere. Normal appendix. No signs of colonic dilation or inflammation. Vascular/Lymphatic: Aortic atherosclerosis. No sign of aneurysm. Smooth contour of the IVC. There is no gastrohepatic or hepatoduodenal ligament lymphadenopathy. No retroperitoneal or mesenteric lymphadenopathy. No pelvic sidewall lymphadenopathy. Mild  atherosclerotic changes of the abdominal aorta. Reproductive: Unremarkable by CT. Other: Postoperative changes in the mid abdomen. Enhancing tissue just to the RIGHT of the midline in the subcutaneous fat compatible with granulation and sequela of prior fat necrosis in this area. No ascites. Musculoskeletal: No acute bone finding. No destructive bone process. Spinal degenerative changes. Signs of spinal nerve stimulator terminating in the lower thoracic spine. Power pack over the RIGHT gluteal region/lower back. IMPRESSION: 1. Findings of mild acute pancreatitis without focal peripancreatic fluid collection. Secondary duodenal inflammation and is favored over primary duodenitis. Correlate with pancreatic enzymes. Inflammation in the pancreatic-duodenal groove is improved since previous imaging. 2. Hepatomegaly with severe hepatic steatosis and fissural widening of hepatic fissures. Correlate with any clinical or laboratory evidence of hepatitis/liver disease. 3. Post cholecystectomy without biliary duct dilation. 4. Nonobstructing punctate calculi approximately 2-3 calculi in the lower pole the RIGHT kidney. 5. Mild aortic atherosclerosis. Aortic Atherosclerosis (ICD10-I70.0). Electronically Signed   By: GZetta BillsM.D.   On: 04/19/2022 11:36    Scheduled Meds:  folic acid  1 mg Oral Daily   heparin  5,000 Units Subcutaneous Q12H   irbesartan  75 mg Oral Daily   metoprolol tartrate  25 mg Oral BID   multivitamin with minerals  1 tablet Oral Daily   nicotine  21 mg Transdermal Daily   pantoprazole (PROTONIX) IV  40 mg Intravenous Q12H   sodium chloride flush  3 mL Intravenous Q12H   thiamine  100 mg Oral Daily   Or   thiamine  100 mg Intravenous Daily   Continuous Infusions:  lactated ringers 75 mL/hr at 04/21/22 0130     LOS: 2 days   RDarliss Cheney MD [facility]  04/21/2022, 9:40 AM   *Please note that this is a verbal dictation therefore any spelling or grammatical errors are  due to the "DColdwaterOne" system interpretation.  Please page via ABuffalo Soapstoneand do not message via secure chat for urgent patient care matters. Secure chat can be used for non urgent patient care matters.  How to contact the TGainesville Urology Asc LLCAttending or Consulting provider 7[place]or covering provider during after hours 7[place] for this patient?  Check the care team in COrthopaedic Ambulatory Surgical Intervention Servicesand look for a) attending/consulting TRH provider listed and b) the TMay[facility] In Barronteam listed. Page or secure chat [phone]. Log into [email or web address] and use [place]'s universal password to access. If you do not have the password, please contact the  hospital operator. Locate the [facility] provider you are looking for under [facility] and page to a number that you can be directly reached. If you still have difficulty reaching the provider, please page the [facility] (Director on Call) for the Hospitalists listed on amion for assistance.

## 2022-04-21 NOTE — Plan of Care (Signed)

## 2022-04-22 DIAGNOSIS — R1114 Bilious vomiting: Secondary | ICD-10-CM | POA: Diagnosis not present

## 2022-04-22 LAB — COMPREHENSIVE METABOLIC PANEL
ALT: 50 U/L — ABNORMAL HIGH (ref 0–44)
AST: 49 U/L — ABNORMAL HIGH (ref 15–41)
Albumin: 3.4 g/dL — ABNORMAL LOW (ref 3.5–5.0)
Alkaline Phosphatase: 94 U/L (ref 38–126)
Anion gap: 7 (ref 5–15)
BUN: 7 mg/dL (ref 6–20)
CO2: 24 mmol/L (ref 22–32)
Calcium: 8.5 mg/dL — ABNORMAL LOW (ref 8.9–10.3)
Chloride: 100 mmol/L (ref 98–111)
Creatinine, Ser: 0.49 mg/dL (ref 0.44–1.00)
GFR, Estimated: >60 mL/min (ref >60)
Glucose, Bld: 158 mg/dL — ABNORMAL HIGH (ref 70–99)
Potassium: 3.4 mmol/L — ABNORMAL LOW (ref 3.5–5.1)
Sodium: 131 mmol/L — ABNORMAL LOW (ref 135–145)
Total Bilirubin: 0.7 mg/dL (ref 0.3–1.2)
Total Protein: 7 g/dL (ref 6.5–8.1)

## 2022-04-22 MED ORDER — POTASSIUM CHLORIDE CRYS ER 20 MEQ PO TBCR
40.0000 meq | EXTENDED_RELEASE_TABLET | Freq: Once | ORAL | Status: AC
  Administered 2022-04-22: 40 meq via ORAL
  Filled 2022-04-22: qty 2

## 2022-04-22 MED ORDER — OMEPRAZOLE 40 MG PO CPDR: 40.0000 mg | DELAYED_RELEASE_CAPSULE | Freq: Every day | ORAL | 0 refills | Status: DC

## 2022-04-22 NOTE — Discharge Instructions (Signed)
                  Intensive Outpatient Programs  [facility]    [facility] [address]     [address] [place],  [place]     [place], [place] [phone]      [phone]  [facility]   [facility]  (Inpatient and outpatient)  [phone] (Suboxone and Methadone) [address]           [phone]           [facility]    [facility] - Intensive Outpatient [address]     [address] [place], [place] [address]     [place], [place]  [phone]      [phone]  [facility] (Outpatient, Inpatient, Chemical  [facility] (Groups and [facility]idental) (insurance only) [phone]    [place], [place]          [phone]       [facility]    [facility] (for caregivers and family) [address]     [address] [place], [place]     [place], [place] [phone]      [phone]  Residential Treatment Programs  [facility]  [facility](2 years) Residential: 90 days)  [facility] [address]      [address] [place], [place]     [place], [place] [phone]      [phone] or [phone]  [facility]    [facility] [address]      [address] [place], [place]     [place], [place] [phone]      [phone]  [facility]   [facility] ([facility]) [address]     [address] [place], [place] [address]     [place], [place] [phone]      [phone] Admissions: 8am-3pm M-F  [facility]: Residential Program (90 Days)              [facility]: [facility]  [place], [place]     [place], [place]  [phone] or [phone]    (Walk in Hours over the weekend or by referral)   [facility]: [facility]:[phone] (for crisis  response 24 hours a day) 

## 2022-04-22 NOTE — Discharge Summary (Signed)
Physician Discharge Summary  Tammy Decker [id number] DOB: 10/04/1970 DOA: 04/19/2022  PCP: Eugenia Pancoast, FNP  Admit date: 04/19/2022 Discharge date: 04/22/2022 30 Day Unplanned Readmission Risk Score    Flowsheet Row ED to Hosp-Admission (Current) from 04/19/2022 in [facility]  30 Day Unplanned Readmission Risk Score (%) 14.55 Filed at 04/22/2022 0801       This score is the patient's risk of an unplanned readmission within 30 days of being discharged (0 -100%). The score is based on dignosis, age, lab data, medications, orders, and past utilization.   Low:  0-14.9   Medium: 15-21.9   High: 22-29.9   Extreme: 30 and above          Admitted From: Home Disposition: Home  Recommendations for Outpatient Follow-up:  Follow up with PCP in 1-2 weeks Please obtain BMP/CBC in one week Please follow up with your PCP on the following pending results: Unresulted Labs (From admission, onward)    None         Home Health: None Equipment/Devices: None  Discharge Condition: Stable CODE STATUS: Full code Diet recommendation: Cardiac  Subjective: Seen and examined, feels well.  Tolerated regular diet.  Feels comfortable going home.  Brief/Interim Summary: Tammy Decker is an 52 y.o. female was seen in ed early morning of 04/19/2022 for N/V/ abdominal pain diagnosed with pancreatitis and pt had to leave so she was discharged but she came back in the afternoon/evening. Last drink was [employer] day.   Acute alcoholic pancreatitis: Lipase elevated up to 200.  She was treated conservatively with n.p.o. and then diet was advanced and now she is tolerating regular diet with no pain.  She has been advised to take it slowly, avoid meat for the next few days and not to eat every portion of the meals.  She verbalized understanding.   Essential hypertension: Blood pressure controlled.  Continue current medications/Avapro.   Acute  duodenitis: Likely due to stress secondary to pancreatitis as well as alcoholism.  She was started on PPI, iron, prescribing her 1 month of PPI oral dose.   Elevated LFTs/alcoholic liver disease: Elevated LFTs consistent with chronic liver disease, improving now.   Discharge plan was discussed with patient and/or family member and they verbalized understanding and agreed with it.  Discharge Diagnoses:  Principal Problem:   Nausea & vomiting Active Problems:   Abdominal pain   Essential hypertension   Alcoholic pancreatitis   Asthma   Abnormal LFTs   Tobacco abuse   Chronic GERD   Anxiety and depression   Hyperglycemia   MDD (major depressive disorder), recurrent episode, moderate (HCC)   Nausea and vomiting    Discharge Instructions   Allergies as of 04/22/2022       Reactions   Ciprofloxacin Anaphylaxis, Hives   Kiwi Extract Anaphylaxis   Sulfa Antibiotics Hives        Medication List     TAKE these medications    cyanocobalamin 1000 MCG tablet Commonly known as: VITAMIN B12 Take 1,000 mcg by mouth daily.   folic acid [id number] MCG tablet Commonly known as: FOLVITE Take 800 mcg by mouth daily.   metoprolol tartrate 25 MG tablet Commonly known as: LOPRESSOR Take 1 tablet (25 mg total) by mouth 2 (two) times daily.   Omeprazole 20 MG Tbec Take 20 mg by mouth See admin instructions. Take 1 tablet (24m) by mouth up to twice daily What changed: Another medication with the same name was added.  Make sure you understand how and when to take each.   omeprazole 40 MG capsule Commonly known as: PRILOSEC Take 1 capsule (40 mg total) by mouth daily. What changed: You were already taking a medication with the same name, and this prescription was added. Make sure you understand how and when to take each.   oxyCODONE-acetaminophen 5-325 MG tablet Commonly known as: Percocet Take 1-2 tablets by mouth every 6 (six) hours as needed for up to 3 days for severe pain.    valsartan 320 MG tablet Commonly known as: DIOVAN Take 1 tablet (320 mg total) by mouth daily.        Follow-up Information     Eugenia Pancoast, FNP Follow up in 1 week(s).   Specialty: Family Medicine Contact information: [place] [address] [phone]                Allergies  Allergen Reactions   Ciprofloxacin Anaphylaxis and Hives   Kiwi Extract Anaphylaxis   Sulfa Antibiotics Hives    Consultations: None   Procedures/Studies: CT ABDOMEN PELVIS W CONTRAST  Result Date: 04/19/2022 CLINICAL DATA:  Abdominal pain and epigastric pain with nausea. EXAM: CT ABDOMEN AND PELVIS WITH CONTRAST TECHNIQUE: Multidetector CT imaging of the abdomen and pelvis was performed using the standard protocol following bolus administration of intravenous contrast. RADIATION DOSE REDUCTION: This exam was performed according to the departmental dose-optimization program which includes automated exposure control, adjustment of the mA and/or kV according to patient size and/or use of iterative reconstruction technique. CONTRAST:  119m OMNIPAQUE IOHEXOL 300 MG/ML  SOLN COMPARISON:  April 01, 2022 FINDINGS: Lower chest: Unremarkable to the extent evaluated. Hepatobiliary: Severe hepatic steatosis and lobular hepatic contours with fissural widening. Hepatomegaly approximately 22 cm greatest craniocaudal extent. No focal, suspicious hepatic lesion. Portal vein is patent. Post cholecystectomy without biliary duct dilation. Pancreas: Inflammation about the head of the pancreas greater than other areas of the pancreas. Mild stranding about the body as well. No focal peripancreatic fluid collection. No pancreatic ductal dilation. Spleen: Normal. Adrenals/Urinary Tract: Adrenal glands are normal. Symmetric renal enhancement without hydronephrosis, perinephric stranding or suspicious renal lesion. Nonobstructing punctate calculi approximately 2-3 calculi in the lower pole the RIGHT  kidney. Stomach/Bowel: Secondary inflammation of the duodenum is suspected. No sign of small bowel obstruction. No stranding about small bowel elsewhere. Normal appendix. No signs of colonic dilation or inflammation. Vascular/Lymphatic: Aortic atherosclerosis. No sign of aneurysm. Smooth contour of the IVC. There is no gastrohepatic or hepatoduodenal ligament lymphadenopathy. No retroperitoneal or mesenteric lymphadenopathy. No pelvic sidewall lymphadenopathy. Mild atherosclerotic changes of the abdominal aorta. Reproductive: Unremarkable by CT. Other: Postoperative changes in the mid abdomen. Enhancing tissue just to the RIGHT of the midline in the subcutaneous fat compatible with granulation and sequela of prior fat necrosis in this area. No ascites. Musculoskeletal: No acute bone finding. No destructive bone process. Spinal degenerative changes. Signs of spinal nerve stimulator terminating in the lower thoracic spine. Power pack over the RIGHT gluteal region/lower back. IMPRESSION: 1. Findings of mild acute pancreatitis without focal peripancreatic fluid collection. Secondary duodenal inflammation and is favored over primary duodenitis. Correlate with pancreatic enzymes. Inflammation in the pancreatic-duodenal groove is improved since previous imaging. 2. Hepatomegaly with severe hepatic steatosis and fissural widening of hepatic fissures. Correlate with any clinical or laboratory evidence of hepatitis/liver disease. 3. Post cholecystectomy without biliary duct dilation. 4. Nonobstructing punctate calculi approximately 2-3 calculi in the lower pole the RIGHT kidney. 5. Mild  aortic atherosclerosis. Aortic Atherosclerosis (ICD10-I70.0). Electronically Signed   By: Zetta Bills M.D.   On: 04/19/2022 11:36   CT CORONARY MORPH W/CTA COR W/SCORE W/CA W/CM &/OR WO/CM  Addendum Date: 04/17/2022   ADDENDUM REPORT: 04/17/2022 21:02 EXAM: OVER-READ INTERPRETATION  CT CHEST The following report is an over-read  performed by radiologist Dr. Carolynn Comment [facility] on 04/17/2022. This over-read does not include interpretation of cardiac or coronary anatomy or pathology. The coronary CTA interpretation by the cardiologist is attached. COMPARISON:  None. FINDINGS: Are no enlarged mediastinal lymph nodes identified. Esophagus is nondilated. The visualized lungs are clear. Visualized upper abdomen is within normal limits. No acute osseous abnormalities are seen. IMPRESSION: No significant extracardiac findings. Electronically Signed   By: Ronney Asters M.D.   On: 04/17/2022 21:02   Result Date: 04/17/2022 HISTORY: Chest pain, nonspecific EXAM: Cardiac/Coronary  CT TECHNIQUE: The patient was scanned on a [employer]. PROTOCOL: A 120 kV prospective scan was triggered in the descending thoracic aorta at 111 HU's. Axial non-contrast 3 mm slices were carried out through the heart. The data set was analyzed on a dedicated work station and scored using the Agatston method. Gantry rotation speed was 250 msecs and collimation was .6 mm. Beta blockade and 0.8 mg of sl NTG was given. The 3D data set was reconstructed in 5% intervals of the 35-75 % of the R-R cycle. Systolic and diastolic phases were analyzed on a dedicated work station using MPR, MIP and VRT modes. The patient received contrast: 106m OMNIPAQUE IOHEXOL 350 MG/ML SOLN. FINDINGS: Image quality: Good Noise artifact is: Limited Coronary calcium score is 0. Coronary arteries: Normal coronary origins.  Right dominance. Right Coronary Artery: No detectable plaque or stenosis. Left Main Coronary Artery: No detectable plaque or stenosis. Left Anterior Descending Coronary Artery: Minimal atherosclerotic plaque in the mid LAD <25% stenosis. Large wrap around LAD. Left Circumflex Artery: Minimal luminal irregularity may be secondary to image quality. Aorta: Normal size, 28 mm at the mid ascending aorta (level of the PA bifurcation) measured double oblique.  Aortic Valve: No calcifications.  AV appears tricuspid. Other findings: Normal pulmonary vein drainage into the left atrium. Normal left atrial appendage without thrombus. Normal size of the pulmonary artery. [facility] for non-card[facility] findings. IMPRESSION: 1. Minimal CAD, <25% stenosis, CADRADS 1. 2. Coronary calcium score of 0. 3. Normal coronary origins with right dominance. RECOMMENDATIONS: CAD-RADS 1. Minimal non-obstructive CAD (0-24%). Consider non-atherosclerotic causes of chest pain. Consider preventive therapy and risk factor modification. Electronically Signed: By: GCherlynn KaiserM.D. On: 04/15/2022 16:28   CT ABDOMEN PELVIS W CONTRAST  Result Date: 04/02/2022 CLINICAL DATA:  Right-sided abdominal pain and history of pancreatitis EXAM: CT ABDOMEN AND PELVIS WITH CONTRAST TECHNIQUE: Multidetector CT imaging of the abdomen and pelvis was performed using the standard protocol following bolus administration of intravenous contrast. RADIATION DOSE REDUCTION: This exam was performed according to the departmental dose-optimization program which includes automated exposure control, adjustment of the mA and/or kV according to patient size and/or use of iterative reconstruction technique. CONTRAST:  1071mOMNIPAQUE IOHEXOL 300 MG/ML  SOLN COMPARISON:  01/12/2022 FINDINGS: Lower chest: No acute abnormality. Hepatobiliary: Fatty infiltration of the liver is noted. The gallbladder has been surgically removed. Pancreas: Pancreas is well visualized. Some peripancreatic inflammatory changes are noted about the head and uncinate process similar to that seen on the prior exam consistent with focal pancreatitis. Spleen: Normal in size without focal abnormality. Adrenals/Urinary Tract:  Adrenal glands are within normal limits. Kidneys demonstrate a normal enhancement pattern. Tiny nonobstructing renal calculi are noted bilaterally. Will excretion is noted from the kidneys  bilaterally. No obstructive changes are seen. The bladder is partially distended. Stomach/Bowel: Colon is predominately decompressed. No obstructive or inflammatory changes are seen. The appendix is within normal limits. Stomach is within normal limits. Small bowel is within normal limits with the exception of mild inflammatory changes in the second portion of the duodenum related to the adjacent pancreatitis. Vascular/Lymphatic: No significant vascular findings are present. No enlarged abdominal or pelvic lymph nodes. Reproductive: Uterus and bilateral adnexa are unremarkable. Other: No abdominal wall hernia or abnormality. No abdominopelvic ascites. Musculoskeletal: No acute or significant osseous findings. Spinal stimulator is noted. IMPRESSION: Changes consistent with pancreatitis involving the head and uncinate process with associated reactive duodenitis. The overall appearance is similar to that seen on the prior exam. Fatty liver. Tiny nonobstructing renal calculi bilaterally. Electronically Signed   By: Inez Catalina M.D.   On: 04/02/2022 00:02     Discharge Exam: Vitals:   04/22/22 0508 04/22/22 0735  BP: 135/75 (!) 149/78  Pulse: 91 79  Resp: 18 18  Temp: 98.2 F (36.8 C) 98.1 F (36.7 C)  SpO2: 96% 95%   Vitals:   04/21/22 1625 04/21/22 1941 04/22/22 0508 04/22/22 0735  BP: (!) 171/102 (!) 152/96 135/75 (!) 149/78  Pulse: 84 90 91 79  Resp: 18 18 18 18  $ Temp: 98.7 F (37.1 C) 98.6 F (37 C) 98.2 F (36.8 C) 98.1 F (36.7 C)  TempSrc: Oral  Oral   SpO2: 98% 96% 96% 95%  Weight:      Height:        General: Pt is alert, awake, not in acute distress Cardiovascular: RRR, S1/S2 +, no rubs, no gallops Respiratory: CTA bilaterally, no wheezing, no rhonchi Abdominal: Soft, NT, ND, bowel sounds + Extremities: no edema, no cyanosis    The results of significant diagnostics from this hospitalization (including imaging, microbiology, ancillary and laboratory) are listed below for  reference.     Microbiology: No results found for this or any previous visit (from the past 240 hour(s)).   Labs: BNP (last 3 results) No results for input(s): "BNP" in the last 8760 hours. Basic Metabolic Panel: Recent Labs  Lab 04/19/22 0918 04/19/22 2128 04/20/22 0424 04/21/22 0458 04/22/22 0430  NA 134*  --  135 135 131*  K 3.6  --  3.5 3.7 3.4*  CL 102  --  103 104 100  CO2 25  --  25 24 24  $ GLUCOSE 144*  --  122* 120* 158*  BUN 8  --  7 <5* 7  CREATININE 0.45  --  0.48 0.51 0.49  CALCIUM 8.6*  --  8.2* 8.6* 8.5*  MG  --  1.8  --   --   --    Liver Function Tests: Recent Labs  Lab 04/19/22 0918 04/20/22 0424 04/21/22 0458 04/22/22 0430  AST 75* 96* 70* 49*  ALT 71* 67* 63* 50*  ALKPHOS 95 86 88 94  BILITOT 0.7 0.9 1.1 0.7  PROT 8.0 6.9 6.9 7.0  ALBUMIN 3.7 3.3* 3.4* 3.4*   Recent Labs  Lab 04/19/22 0918  LIPASE 198*   No results for input(s): "AMMONIA" in the last 168 hours. CBC: Recent Labs  Lab 04/19/22 0918 04/20/22 0424  WBC 9.7 8.1  HGB 15.7* 13.5  HCT 47.0* 40.9  MCV 100.9* 102.0*  PLT 285 231  Cardiac Enzymes: No results for input(s): "CKTOTAL", "CKMB", "CKMBINDEX", "TROPONINI" in the last 168 hours. BNP: Invalid input(s): "POCBNP" CBG: No results for input(s): "GLUCAP" in the last 168 hours. D-Dimer No results for input(s): "DDIMER" in the last 72 hours. Hgb A1c No results for input(s): "HGBA1C" in the last 72 hours. Lipid Profile Recent Labs    04/19/22 2128  CHOL 199  HDL 42  LDLCALC 137*  TRIG 99  CHOLHDL 4.7   Thyroid function studies Recent Labs    04/19/22 2128  TSH 12.110*   Anemia work up Recent Labs    04/19/22 2128  VITAMINB12 229  FOLATE 10.7   Urinalysis    Component Value Date/Time   COLORURINE AMBER (A) 04/19/2022 1221   APPEARANCEUR CLEAR (A) 04/19/2022 1221   LABSPEC 1.034 (H) 04/19/2022 1221   PHURINE 6.0 04/19/2022 1221   GLUCOSEU NEGATIVE 04/19/2022 1221   GLUCOSEU NEGATIVE 01/22/2022  1300   HGBUR NEGATIVE 04/19/2022 1221   BILIRUBINUR NEGATIVE 04/19/2022 1221   BILIRUBINUR Negative 12/07/2021 1602   KETONESUR 5 (A) 04/19/2022 1221   PROTEINUR 30 (A) 04/19/2022 1221   UROBILINOGEN 0.2 01/22/2022 1300   NITRITE NEGATIVE 04/19/2022 1221   LEUKOCYTESUR NEGATIVE 04/19/2022 1221   Sepsis Labs Recent Labs  Lab 04/19/22 0918 04/20/22 0424  WBC 9.7 8.1   Microbiology No results found for this or any previous visit (from the past 240 hour(s)).   Time coordinating discharge: Over 30 minutes  SIGNED:   Darliss Cheney, MD  [facility] 04/22/2022, 9:29 AM *Please note that this is a verbal dictation therefore any spelling or grammatical errors are due to the "[place]" system interpretation. If 7PM-7AM, please contact night-coverage [email or web address]

## 2022-04-22 NOTE — TOC Initial Note (Signed)
Transition of Care [facility]) - Initial/Assessment Note    Patient Details  Name: Tammy Decker MRN: [id number] Date of Birth: 09-Jun-1970  Transition of [facility]) CM/SW Contact:    Beverly Sessions, RN Phone Number: 04/22/2022, 10:14 AM  Clinical Narrative:                 Substance abuse resources added to AVS for discharge         Patient Goals and CMS Choice            Expected Discharge Plan and Services         Expected Discharge Date: 04/22/22                                    Prior Living Arrangements/Services                       Activities of Daily Living Home Assistive Devices/Equipment: None ADL Screening (condition at time of admission) Patient's cognitive ability adequate to safely complete daily activities?: Yes Is the patient deaf or have difficulty hearing?: No Does the patient have difficulty seeing, even when wearing glasses/contacts?: No Does the patient have difficulty concentrating, remembering, or making decisions?: No Patient able to express need for assistance with ADLs?: No Does the patient have difficulty dressing or bathing?: No Independently performs ADLs?: Yes (appropriate for developmental age) Does the patient have difficulty walking or climbing stairs?: No Weakness of Legs: None Weakness of Arms/Hands: None  Permission Sought/Granted                  Emotional Assessment              Admission diagnosis:  Nausea and vomiting [R11.2] Acute pancreatitis, unspecified complication status, unspecified pancreatitis type [K85.90] Patient Active Problem List   Diagnosis Date Noted   Nausea & vomiting 04/19/2022   Nausea and vomiting 04/19/2022   Epigastric pain 04/03/2022   History of alcohol abuse 04/03/2022   Renal cyst, acquired, left 01/22/2022   H/O pyelonephritis 01/22/2022   Proteinuria 01/22/2022   Has recently quit alcohol use [id number]   Alcoholic pancreatitis [id number]   Tobacco  abuse 01/08/2022   Asthma 01/08/2022   Obesity with body mass index (BMI) of 30.0 to 39.9 01/08/2022   Abnormal LFTs 01/08/2022   Heart rate fast 12/07/2021   Anxiety and depression 12/07/2021   Menopause 12/07/2021   Hyperglycemia 12/07/2021   H/o Lyme disease 12/07/2021   Chronic GERD    MDD (major depressive disorder), recurrent episode, moderate ([place]) 09/13/2020   Essential hypertension 09/11/2020   Abdominal pain 06/23/2020   Degeneration of lumbar intervertebral disc 09/13/2013   Chronic back pain 09/01/2012   PCP:  Eugenia Pancoast, FNP Pharmacy:   [place] [address] Phone: [phone] Fax: [place] [address] Phone: [phone] Fax: [phone]     Social Determinants of Health (SDOH) Social History: SDOH Screenings   Food Insecurity: No Food Insecurity (04/20/2022)  Housing: Low Risk  (04/20/2022)  Transportation Needs: No Transportation Needs (04/20/2022)  Utilities: Not At Risk (04/20/2022)  Alcohol Screen: High Risk (12/17/2021)  Depression (PHQ2-9): High Risk (01/22/2022)  Tobacco Use: High Risk (04/19/2022)   SDOH Interventions:     Readmission Risk Interventions  No data to display

## 2022-04-23 ENCOUNTER — Encounter: Payer: Self-pay | Admitting: Family

## 2022-04-23 ENCOUNTER — Ambulatory Visit (INDEPENDENT_AMBULATORY_CARE_PROVIDER_SITE_OTHER): Payer: PRIVATE HEALTH INSURANCE | Admitting: Family

## 2022-04-23 ENCOUNTER — Telehealth: Payer: Self-pay

## 2022-04-23 VITALS — BP 130/82 | HR 97 | Temp 97.7°F | Ht 67.0 in | Wt 192.0 lb

## 2022-04-23 DIAGNOSIS — E876 Hypokalemia: Secondary | ICD-10-CM

## 2022-04-23 DIAGNOSIS — F1010 Alcohol abuse, uncomplicated: Secondary | ICD-10-CM | POA: Diagnosis not present

## 2022-04-23 DIAGNOSIS — K2980 Duodenitis without bleeding: Secondary | ICD-10-CM

## 2022-04-23 DIAGNOSIS — R160 Hepatomegaly, not elsewhere classified: Secondary | ICD-10-CM

## 2022-04-23 DIAGNOSIS — K852 Alcohol induced acute pancreatitis without necrosis or infection: Secondary | ICD-10-CM | POA: Diagnosis not present

## 2022-04-23 DIAGNOSIS — F17210 Nicotine dependence, cigarettes, uncomplicated: Secondary | ICD-10-CM

## 2022-04-23 DIAGNOSIS — Z720 Tobacco use: Secondary | ICD-10-CM

## 2022-04-23 DIAGNOSIS — R7989 Other specified abnormal findings of blood chemistry: Secondary | ICD-10-CM

## 2022-04-23 DIAGNOSIS — K760 Fatty (change of) liver, not elsewhere classified: Secondary | ICD-10-CM | POA: Diagnosis not present

## 2022-04-23 MED ORDER — NICOTINE 21 MG/24HR TD PT24: 21.0000 mg | MEDICATED_PATCH | Freq: Every day | TRANSDERMAL | 0 refills | Status: DC

## 2022-04-23 NOTE — Progress Notes (Signed)
Established Patient Hospital follow up Visit Subjective:   Patient ID: Tammy Decker, female    DOB: 1971/01/29  Age: 52 y.o. MRN: [id number]  CC:  Chief Complaint  Patient presents with   [facility]        HPI  Tammy Decker is a 52 y.o. female presenting on 04/23/2022 for  hospital follow up.    Hospital: [facility] Admit: 04/19/22 Discharge: 04/22/22 Discharge dx: alcohol pancreatitis with nausea/vomiting , hypokalemia 2/20 mild 3.4 was given four tablets potassium per pt prior to discharge. Discharge medications: oxycodone acetaminophen 5/325 mg  Was previously seen earlier that day but had to leave prior to being appropriately assessed and came back later 2/16 evening. Lipase was elevated at 200 , treated with npo and then diet was advanced. Was advised to avoid meat for next few days. Was found to have acute duodenitis, suspected secondary to pancreatitis as well as alcoholism. Started on ppi and iron.  Was advised to repeat bmp cbc in one week post ER.   Ct abd pelvis in er EKG nsr prolonged QT, with sinus tachy on telemetry Given a L of iv fluids iv ketorolac iv ativan for nausea given prolonged QT was still tachycardic so given another 1 L iv fluids and iv morphine. CT scan showed acute pancreatitis. Severe hepatic steatosis. Mild elevation LFT with labs.also with inflammation duodenum   1/31 was referred to GI. Appt 5/9  Last metabolic panel Lab Results  Component Value Date   GLUCOSE 158 (H) 04/22/2022   NA 131 (L) 04/22/2022   K 3.4 (L) 04/22/2022   CL 100 04/22/2022   CO2 24 04/22/2022   BUN 7 04/22/2022   CREATININE 0.49 04/22/2022   GFRNONAA >60 04/22/2022   CALCIUM 8.5 (L) 04/22/2022   PHOS 2.8 01/08/2022   PROT 7.0 04/22/2022   ALBUMIN 3.4 (L) 04/22/2022   LABGLOB 3.3 09/11/2020   AGRATIO 1.4 09/11/2020   BILITOT 0.7 04/22/2022   ALKPHOS 94 04/22/2022   AST 49 (H) 04/22/2022   ALT 50 (H) 04/22/2022   ANIONGAP 7 04/22/2022    Lab Results  Component Value Date   WBC 8.1 04/20/2022   HGB 13.5 04/20/2022   HCT 40.9 04/20/2022   MCV 102.0 (H) 04/20/2022   PLT 231 04/20/2022        No data to display           Vitamin B12 on the lower end of normal. Still taking daily b12 1000 mcg once daily.   Following up with cardiologist. Recent CTA (unremarkable upon review from 2/12)  and zio monitor (awaiting results). For tachycardia continued metoprolol 25 mg twice daily and also started on valsartan 320 mg once daily. D/c her losartan due to swelling in bil hands.  Echo pending on 2/29  Acute concerns: left under armpit mass, nontender to touch has been ongoing for about one month.   Smoking 10 cigarettes daily, with desire to quit. Has tried nicotine patch in hospital which worked well for her.   Allergies  Allergen Reactions   Ciprofloxacin Anaphylaxis and Hives   Kiwi Extract Anaphylaxis   Sulfa Antibiotics Hives   ----------------   Social history:  Relevant past medical, surgical, family and social history reviewed and updated as indicated. Interim medical history since our last visit reviewed.  Allergies and medications reviewed and updated.  DATA REVIEWED: CHART IN EPIC    ROS: Negative unless specifically indicated above in HPI.    Current  Outpatient Medications:    cyanocobalamin (VITAMIN B12) 1000 MCG tablet, Take 1,000 mcg by mouth daily., Disp: , Rfl:    folic acid (FOLVITE) Q000111Q MCG tablet, Take 800 mcg by mouth daily., Disp: , Rfl:    metoprolol tartrate (LOPRESSOR) 25 MG tablet, Take 1 tablet (25 mg total) by mouth 2 (two) times daily., Disp: 60 tablet, Rfl: 0   nicotine (NICOTINE STEP 1) 21 mg/24hr patch, Place 1 patch (21 mg total) onto the skin daily., Disp: 28 patch, Rfl: 0   Omeprazole 20 MG TBEC, Take 20 mg by mouth See admin instructions. Take 1 tablet (64m) by mouth up to twice daily, Disp: , Rfl:    ondansetron (ZOFRAN-ODT) 4 MG disintegrating tablet, Take 4 mg by  mouth every 8 (eight) hours as needed., Disp: , Rfl:    valsartan (DIOVAN) 320 MG tablet, Take 1 tablet (320 mg total) by mouth daily., Disp: 90 tablet, Rfl: 1      Objective:    BP 130/82   Pulse 97   Temp 97.7 F (36.5 C) (Temporal)   Ht 5' 7"$  (1.702 m)   Wt 192 lb (87.1 kg)   SpO2 98%   BMI 30.07 kg/m   Wt Readings from Last 3 Encounters:  04/23/22 192 lb (87.1 kg)  04/19/22 211 lb 10.3 oz (96 kg)  04/03/22 212 lb (96.2 kg)    Physical Exam Constitutional:      General: She is not in acute distress.    Appearance: Normal appearance. She is obese. She is not ill-appearing, toxic-appearing or diaphoretic.  HENT:     Head: Normocephalic.     Right Ear: Tympanic membrane normal.     Left Ear: Tympanic membrane normal.     Nose: Nose normal.     Mouth/Throat:     Mouth: Mucous membranes are dry.     Pharynx: No oropharyngeal exudate or posterior oropharyngeal erythema.  Eyes:     Extraocular Movements: Extraocular movements intact.     Pupils: Pupils are equal, round, and reactive to light.  Cardiovascular:     Rate and Rhythm: Normal rate and regular rhythm.     Pulses: Normal pulses.     Heart sounds: Normal heart sounds.  Pulmonary:     Effort: Pulmonary effort is normal.     Breath sounds: Normal breath sounds.  Abdominal:     Tenderness: There is abdominal tenderness in the left upper quadrant.  Musculoskeletal:     Cervical back: Normal range of motion.  Neurological:     General: No focal deficit present.     Mental Status: She is alert and oriented to person, place, and time. Mental status is at baseline.  Psychiatric:        Mood and Affect: Mood normal.        Behavior: Behavior normal.        Thought Content: Thought content normal.        Judgment: Judgment normal.         Assessment & Plan:  Hepatic steatosis -     Ambulatory referral to Gastroenterology -     Comprehensive metabolic panel; Future  Alcohol-induced acute pancreatitis without  infection or necrosis -     Ambulatory referral to Gastroenterology  Alcohol abuse  Hepatomegaly -     Ambulatory referral to Gastroenterology  Duodenitis Assessment & Plan: Referral to Gi placed instead of routine as urgent Due to acute on chronic pancreatitis, likely alcohol induced.  Pt with severe hepatic  steatosis as well as duodenum inflammation. Stressed to pt that she needs to completely stop with alcohol and also to maintain her pancreatic friendly diet.   Orders: -     Ambulatory referral to Gastroenterology -     CBC with Differential/Platelet; Future  Elevated LFTs -     CBC with Differential/Platelet; Future -     Comprehensive metabolic panel; Future  Hypokalemia -     Comprehensive metabolic panel; Future  Elevated TSH -     TSH; Future -     T4, free; Future  Cigarette smoker motivated to quit -     Nicotine; Place 1 patch (21 mg total) onto the skin daily.  Dispense: 28 patch; Refill: 0  Abnormal LFTs Assessment & Plan: Repeat cmp one week. Pending results. R/t alcoholic pancreatitis likely   Tobacco abuse Assessment & Plan: Smoking cessation instruction/counseling given:  counseled patient on the dangers of tobacco use, advised patient to stop smoking, and reviewed strategies to maximize success pt aggreable to start nicotine patch, 21 mg sent to pharmacy to start.        Return in about 6 weeks (around 06/04/2022) for f/u smoking cessation .  Eugenia Pancoast, FNP

## 2022-04-23 NOTE — Assessment & Plan Note (Signed)
Repeat cmp one week. Pending results. R/t alcoholic pancreatitis likely

## 2022-04-23 NOTE — Transitions of Care (Post Inpatient/ED Visit) (Signed)
   04/23/2022  Name: Tammy Decker MRN: [id number] DOB: 10-31-1970  Today's TOC FU Call Status: Today's TOC FU Call Status:: Unsuccessul Call (1st Attempt) Unsuccessful Call (1st Attempt) Date: 04/23/22  Attempted to reach the patient regarding the most recent Inpatient/ED visit.  Follow Up Plan: No further outreach attempts will be made at this time. We have been unable to contact the patient.  [place] LPN [place] Advisor Direct Dial [phone]

## 2022-04-23 NOTE — Assessment & Plan Note (Signed)
Smoking cessation instruction/counseling given:  counseled patient on the dangers of tobacco use, advised patient to stop smoking, and reviewed strategies to maximize success pt aggreable to start nicotine patch, 21 mg sent to pharmacy to start.

## 2022-04-23 NOTE — Assessment & Plan Note (Signed)
Referral to Gi placed instead of routine as urgent Due to acute on chronic pancreatitis, likely alcohol induced.  Pt with severe hepatic steatosis as well as duodenum inflammation. Stressed to pt that she needs to completely stop with alcohol and also to maintain her pancreatic friendly diet.

## 2022-04-25 ENCOUNTER — Telehealth: Payer: Self-pay

## 2022-04-25 NOTE — Telephone Encounter (Signed)
We received a Urgent referral for Hepatic steatosis,  Alcohol-induced acute pancreatitis without infection or necrosis, Hepatomegaly , Duodenitis. Patient has appointment with you on 07/11/2022 but since the referral is urgent do you want to see her sooner?

## 2022-04-28 NOTE — Telephone Encounter (Signed)
She has chronically elevated LFTs, without any evidence of cirrhosis. Also, she was a no show in 2023, also had an egd in 2023. I can see her sooner if there are any cancellations  RV

## 2022-04-29 NOTE — Telephone Encounter (Signed)
Added patient to the wait list and sent a message back to PCP with the response of Dr. Marius Ditch

## 2022-04-30 ENCOUNTER — Other Ambulatory Visit: Payer: PRIVATE HEALTH INSURANCE

## 2022-05-01 ENCOUNTER — Telehealth: Payer: Self-pay

## 2022-05-01 NOTE — Telephone Encounter (Signed)
Unable to leave a voicemail. Patient is on the waiting list for Dr Marius Ditch and will need to make sure she goes to that appt once they call her.

## 2022-05-02 ENCOUNTER — Encounter (HOSPITAL_COMMUNITY): Payer: Self-pay | Admitting: Cardiology

## 2022-05-02 ENCOUNTER — Other Ambulatory Visit (HOSPITAL_COMMUNITY): Payer: PRIVATE HEALTH INSURANCE

## 2022-05-03 ENCOUNTER — Other Ambulatory Visit (INDEPENDENT_AMBULATORY_CARE_PROVIDER_SITE_OTHER): Payer: PRIVATE HEALTH INSURANCE

## 2022-05-03 DIAGNOSIS — R7989 Other specified abnormal findings of blood chemistry: Secondary | ICD-10-CM | POA: Diagnosis not present

## 2022-05-03 DIAGNOSIS — K760 Fatty (change of) liver, not elsewhere classified: Secondary | ICD-10-CM

## 2022-05-03 DIAGNOSIS — K2980 Duodenitis without bleeding: Secondary | ICD-10-CM

## 2022-05-03 DIAGNOSIS — E876 Hypokalemia: Secondary | ICD-10-CM

## 2022-05-04 LAB — CBC WITH DIFFERENTIAL/PLATELET
Absolute Monocytes: 551 cells/uL (ref 200–950)
Basophils Absolute: 86 cells/uL (ref 0–200)
Basophils Relative: 0.8 %
Eosinophils Absolute: 184 cells/uL (ref 15–500)
Eosinophils Relative: 1.7 %
HCT: 44.7 % (ref 35.0–45.0)
Hemoglobin: 15.3 g/dL (ref 11.7–15.5)
Lymphs Abs: 2419 cells/uL (ref 850–3900)
MCH: 33.5 pg — ABNORMAL HIGH (ref 27.0–33.0)
MCHC: 34.2 g/dL (ref 32.0–36.0)
MCV: 97.8 fL (ref 80.0–100.0)
MPV: 11.3 fL (ref 7.5–12.5)
Monocytes Relative: 5.1 %
Neutro Abs: 7560 cells/uL (ref 1500–7800)
Neutrophils Relative %: 70 %
Platelets: 404 10*3/uL — ABNORMAL HIGH (ref 140–400)
RBC: 4.57 10*6/uL (ref 3.80–5.10)
RDW: 12.4 % (ref 11.0–15.0)
Total Lymphocyte: 22.4 %
WBC: 10.8 10*3/uL (ref 3.8–10.8)

## 2022-05-04 LAB — COMPREHENSIVE METABOLIC PANEL
AG Ratio: 1.2 (calc) (ref 1.0–2.5)
ALT: 34 U/L — ABNORMAL HIGH (ref 6–29)
AST: 35 U/L (ref 10–35)
Albumin: 4.1 g/dL (ref 3.6–5.1)
Alkaline phosphatase (APISO): 103 U/L (ref 37–153)
BUN/Creatinine Ratio: 8 (calc) (ref 6–22)
BUN: 5 mg/dL — ABNORMAL LOW (ref 7–25)
CO2: 24 mmol/L (ref 20–32)
Calcium: 9.5 mg/dL (ref 8.6–10.4)
Chloride: 103 mmol/L (ref 98–110)
Creat: 0.6 mg/dL (ref 0.50–1.03)
Globulin: 3.4 g/dL (calc) (ref 1.9–3.7)
Glucose, Bld: 119 mg/dL — ABNORMAL HIGH (ref 65–99)
Potassium: 4.2 mmol/L (ref 3.5–5.3)
Sodium: 136 mmol/L (ref 135–146)
Total Bilirubin: 0.5 mg/dL (ref 0.2–1.2)
Total Protein: 7.5 g/dL (ref 6.1–8.1)

## 2022-05-04 LAB — TSH: TSH: 1.7 mIU/L

## 2022-05-04 LAB — T4, FREE: Free T4: 1 ng/dL (ref 0.8–1.8)

## 2022-05-14 NOTE — Telephone Encounter (Signed)
can you please close encounter? I am having trouble doing so.

## 2022-05-14 NOTE — Telephone Encounter (Signed)
Closed, per Tabitha's request

## 2022-05-30 NOTE — Addendum Note (Signed)
Addended by: Eugenia Pancoast on: 05/30/2022 09:10 PM   Modules accepted: Level of Service

## 2022-05-30 NOTE — Telephone Encounter (Signed)
erroneous

## 2022-06-24 ENCOUNTER — Ambulatory Visit: Payer: No Typology Code available for payment source | Admitting: Family

## 2022-06-24 ENCOUNTER — Encounter: Payer: Self-pay | Admitting: Family

## 2022-06-26 ENCOUNTER — Ambulatory Visit (INDEPENDENT_AMBULATORY_CARE_PROVIDER_SITE_OTHER): Payer: No Typology Code available for payment source | Admitting: Family

## 2022-06-26 ENCOUNTER — Encounter: Payer: Self-pay | Admitting: Family

## 2022-06-26 ENCOUNTER — Encounter: Payer: Self-pay | Admitting: *Deleted

## 2022-06-26 VITALS — BP 118/82 | HR 118 | Temp 98.1°F | Ht 67.0 in | Wt 218.0 lb

## 2022-06-26 DIAGNOSIS — M25512 Pain in left shoulder: Secondary | ICD-10-CM

## 2022-06-26 DIAGNOSIS — Z720 Tobacco use: Secondary | ICD-10-CM

## 2022-06-26 DIAGNOSIS — R2232 Localized swelling, mass and lump, left upper limb: Secondary | ICD-10-CM

## 2022-06-26 DIAGNOSIS — I10 Essential (primary) hypertension: Secondary | ICD-10-CM | POA: Diagnosis not present

## 2022-06-26 NOTE — Patient Instructions (Signed)
  I have ordered imaging for you at [facility] outpatient diagnostic center for a left axillary ultrasound. This order has been sent over for you electronically.  Please call [phone] to schedule this appointment.  Give the mobile mammogram unit a call to get scheduled.    Regards,   Mort Sawyers FNP-C

## 2022-06-26 NOTE — Progress Notes (Unsigned)
Established Patient Office Visit  Subjective:      CC:  Chief Complaint  Patient presents with   Medical Management of Chronic Issues    HPI: Tammy Decker is a 52 y.o. female presenting on 06/26/2022 for Medical Management of Chronic Issues . Alcohol abuse, In remission, day 69. Doing really well  Wt Readings from Last 3 Encounters:  06/26/22 218 lb (98.9 kg)  04/23/22 192 lb (87.1 kg)  04/19/22 211 lb 10.3 oz (96 kg)   12/07/21 weight 240   [place]anting to look [place] travel [place].   Since 1/24 has had left underarm armpit mass, feels since then it is increasing in size. Tenderness on palpation at times. Now having shoulder pain, on the top of the shoulder, has been ongoing for a few months. Denies known injury or trauma. Limits use due to pain at times. No decreased grip in hand or weakness. Hard to reach behind her.    Last mammogram was years ago.        Social history:  Relevant past medical, surgical, family and social history reviewed and updated as indicated. Interim medical history since our last visit reviewed.  Allergies and medications reviewed and updated.  DATA REVIEWED: CHART IN EPIC     ROS: Negative unless specifically indicated above in HPI.    Current Outpatient Medications:    cyanocobalamin (VITAMIN B12) 1000 MCG tablet, Take 1,000 mcg by mouth daily., Disp: , Rfl:    folic acid (FOLVITE) 800 MCG tablet, Take 800 mcg by mouth daily., Disp: , Rfl:    metoprolol tartrate (LOPRESSOR) 25 MG tablet, Take 1 tablet (25 mg total) by mouth 2 (two) times daily., Disp: 60 tablet, Rfl: 0   nicotine (NICOTINE STEP 1) 21 mg/24hr patch, Place 1 patch (21 mg total) onto the skin daily., Disp: 28 patch, Rfl: 0   Omeprazole 20 MG TBEC, Take 20 mg by mouth See admin instructions. Take 1 tablet ( ) by mouth up to twice daily, Disp: , Rfl:    ondansetron (ZOFRAN-ODT) 4 MG disintegrating tablet, Take 4 mg by mouth every 8 (eight) hours  as needed., Disp: , Rfl:    valsartan (DIOVAN) 320 MG tablet, Take 1 tablet (320 mg total) by mouth daily., Disp: 90 tablet, Rfl: 1      Objective:    BP 118/82 (BP Location: Left Arm)   Pulse (!) 118   Temp 98.1 F (36.7 C) (Temporal)   Ht  (1.702 m)   Wt 218 lb (98.9 kg)   SpO2 98%   BMI 34.14 kg/m   Wt Readings from Last 3 Encounters:  06/26/22 218 lb (98.9 kg)  04/23/22 192 lb (87.1 kg)  04/19/22 211 lb 10.3 oz (96 kg)    Physical Exam Chest:    Musculoskeletal:     Right shoulder: Tenderness present. Bony tenderness: at [facility] joint.Decreased range of motion (farthest for extension is 90 degrees).     Comments: Positive slight drop can right arm  Only very slight impingement on external rotation            Assessment & Plan:  Axillary mass, left Assessment & Plan: Increasing in size.  Suspected cyst  Please monitor site for worsening signs/symptoms of infection to include: increasing redness, increasing tenderness, increase in size, and or pustulant drainage from site. If this is to occur please let me know immediately.  Ordered u/s axillary pending results  Orders: -     US  BREAST COMPLETE UNI LEFT INC AXILLA; Future  Acute pain of left shoulder Assessment & Plan: Advised on ROM exercises  Referral placed for Dr. Patsy Lager for eval/treat  Will hold off on shoulder xray pending eval Handout given to pt, recommendation for stretching exercises, heat to site prn, tylenol prn as well as lidocaine patches otc if needed Ddx rotator cuff sprain/tear, impingement syndrome   Essential hypertension Assessment & Plan: stable   Tobacco abuse Assessment & Plan: Smoking cessation instruction/counseling given:  counseled patient on the dangers of tobacco use, advised patient to stop smoking, and reviewed strategies to maximize success       Return in about 1 month (around 07/26/2022) for f/u PAP, f/u CPE.  Mort Sawyers, MSN, APRN, FNP-C [facility]

## 2022-06-27 DIAGNOSIS — M25512 Pain in left shoulder: Secondary | ICD-10-CM | POA: Insufficient documentation

## 2022-06-27 DIAGNOSIS — R2232 Localized swelling, mass and lump, left upper limb: Secondary | ICD-10-CM | POA: Insufficient documentation

## 2022-06-27 NOTE — Assessment & Plan Note (Signed)
Smoking cessation instruction/counseling given:  counseled patient on the dangers of tobacco use, advised patient to stop smoking, and reviewed strategies to maximize success 

## 2022-06-27 NOTE — Assessment & Plan Note (Signed)
stable °

## 2022-06-27 NOTE — Assessment & Plan Note (Signed)
Increasing in size.  Suspected cyst  Please monitor site for worsening signs/symptoms of infection to include: increasing redness, increasing tenderness, increase in size, and or pustulant drainage from site. If this is to occur please let me know immediately.  Ordered u/s axillary pending results

## 2022-06-27 NOTE — Assessment & Plan Note (Signed)
Advised on ROM exercises  Referral placed for Dr. Patsy Lager for eval/treat  Will hold off on shoulder xray pending eval Handout given to pt, recommendation for stretching exercises, heat to site prn, tylenol prn as well as lidocaine patches otc if needed Ddx rotator cuff sprain/tear, impingement syndrome

## 2022-07-01 ENCOUNTER — Ambulatory Visit: Payer: No Typology Code available for payment source | Admitting: Family Medicine

## 2022-07-01 ENCOUNTER — Ambulatory Visit: Payer: No Typology Code available for payment source | Admitting: Family

## 2022-07-11 ENCOUNTER — Ambulatory Visit: Payer: PRIVATE HEALTH INSURANCE | Admitting: Gastroenterology

## 2022-07-17 ENCOUNTER — Other Ambulatory Visit: Payer: Self-pay | Admitting: Family

## 2022-07-17 ENCOUNTER — Ambulatory Visit: Payer: No Typology Code available for payment source | Admitting: Family

## 2022-07-17 ENCOUNTER — Encounter: Payer: Self-pay | Admitting: Family

## 2022-07-17 VITALS — BP 128/82 | HR 110 | Temp 97.9°F | Ht 67.0 in | Wt 213.2 lb

## 2022-07-17 DIAGNOSIS — S30860A Insect bite (nonvenomous) of lower back and pelvis, initial encounter: Secondary | ICD-10-CM | POA: Diagnosis not present

## 2022-07-17 DIAGNOSIS — W57XXXA Bitten or stung by nonvenomous insect and other nonvenomous arthropods, initial encounter: Secondary | ICD-10-CM

## 2022-07-17 DIAGNOSIS — J45909 Unspecified asthma, uncomplicated: Secondary | ICD-10-CM | POA: Insufficient documentation

## 2022-07-17 DIAGNOSIS — J4541 Moderate persistent asthma with (acute) exacerbation: Secondary | ICD-10-CM

## 2022-07-17 MED ORDER — PREDNISONE 10 MG (21) PO TBPK: ORAL_TABLET | ORAL | 0 refills | Status: DC

## 2022-07-17 MED ORDER — DOXYCYCLINE HYCLATE 100 MG PO TABS: 100.0000 mg | ORAL_TABLET | Freq: Two times a day (BID) | ORAL | 0 refills | Status: AC

## 2022-07-17 MED ORDER — AZITHROMYCIN 250 MG PO TABS: ORAL_TABLET | ORAL | 0 refills | Status: DC

## 2022-07-17 MED ORDER — BUDESONIDE 90 MCG/ACT IN AEPB
1.0000 | INHALATION_SPRAY | Freq: Two times a day (BID) | RESPIRATORY_TRACT | 0 refills | Status: DC
Start: 2022-07-17 — End: 2022-11-14

## 2022-07-17 MED ORDER — FLUTICASONE PROPIONATE HFA 110 MCG/ACT IN AERO: 1.0000 | INHALATION_SPRAY | Freq: Two times a day (BID) | RESPIRATORY_TRACT | 12 refills | Status: DC

## 2022-07-17 NOTE — Assessment & Plan Note (Signed)
Appears to be clean of tick particles however with cellulitis will treat with doxycycline which will also help with bronchitis Pt advised to:  Please monitor site for worsening signs/symptoms of infection to include: increasing redness, increasing tenderness, increase in size, and or pustulant drainage from site. If this is to occur please let me know immediately.

## 2022-07-17 NOTE — Assessment & Plan Note (Signed)
Take antibiotic as prescribed. Increase oral fluids. Pt to f/u if sx worsen and or fail to improve in 2-3 days. RX doxycycline 100 mg po bid x 10 days Rx prednisone pack  Also sending in flovent to use twice daily to try to decrease need for albuterol

## 2022-07-17 NOTE — Progress Notes (Signed)
Established Patient Office Visit  Subjective:   Patient ID: Tammy Decker, female    DOB: 1970-10-12  Age: 52 y.o. MRN: [id number]  CC:  Chief Complaint  Patient presents with   Cough    For over two weeks. Flu and Covid test at work was negative.    HPI: Tammy Decker is a 52 y.o. female presenting on 07/17/2022 for Cough (For over two weeks. Flu and Covid test at work was negative.)   Cough    Two week h/o nasal congestion, cough with chest congestion. No sore throat no ear pain. Some sob with coughing. She has tried mucinex and tessalon perrles but coughing so much she is using her inhaler every few hours. She did also use nebulizer at work with mild relief.   Also was bitten by a tick about one week ago. Tried to take out all pieces, not sure if she got them all but does have redness surrounding the area of the bite.  Was not engorged when she removed the tick.     ROS: Negative unless specifically indicated above in HPI.   Relevant past medical history reviewed and updated as indicated.   Allergies and medications reviewed and updated.   Current Outpatient Medications:    cyanocobalamin (VITAMIN B12) 1000 MCG tablet, Take 1,000 mcg by mouth daily., Disp: , Rfl:    doxycycline (VIBRA-TABS) 100 MG tablet, Take 1 tablet (100 mg total) by mouth 2 (two) times daily for 10 days., Disp: 20 tablet, Rfl: 0   fluticasone (FLOVENT HFA) 110 MCG/ACT inhaler, Inhale 1 puff into the lungs in the morning and at bedtime., Disp: 1 each, Rfl: 12   folic acid (FOLVITE) 800 MCG tablet, Take 800 mcg by mouth daily., Disp: , Rfl:    metoprolol tartrate (LOPRESSOR) 25 MG tablet, Take 1 tablet (25 mg total) by mouth 2 (two) times daily., Disp: 60 tablet, Rfl: 0   nicotine (NICOTINE STEP 1) 21 mg/24hr patch, Place 1 patch (21 mg total) onto the skin daily., Disp: 28 patch, Rfl: 0   Omeprazole 20 MG TBEC, Take 20 mg by mouth See admin instructions. Take 1 tablet (20mg ) by mouth up  to twice daily, Disp: , Rfl:    ondansetron (ZOFRAN-ODT) 4 MG disintegrating tablet, Take 4 mg by mouth every 8 (eight) hours as needed., Disp: , Rfl:    predniSONE (STERAPRED UNI-PAK 21 TAB) 10 MG (21) TBPK tablet, Take as directed, Disp: 1 each, Rfl: 0   valsartan (DIOVAN) 320 MG tablet, Take 1 tablet (320 mg total) by mouth daily., Disp: 90 tablet, Rfl: 1  Allergies  Allergen Reactions   Ciprofloxacin Anaphylaxis and Hives   Kiwi Extract Anaphylaxis   Sulfa Antibiotics Hives    Objective:   BP 128/82 (BP Location: Left Arm)   Pulse (!) 110   Temp 97.9 F (36.6 C) (Temporal)   Ht 5\' 7"  (1.702 m)   Wt 213 lb 3.2 oz (96.7 kg)   SpO2 97%   BMI 33.39 kg/m    Physical Exam Constitutional:      General: She is not in acute distress.    Appearance: Normal appearance. She is normal weight. She is not ill-appearing, toxic-appearing or diaphoretic.  HENT:     Head: Normocephalic.     Right Ear: Tympanic membrane normal.     Left Ear: Tympanic membrane normal.     Nose: Nose normal.     Mouth/Throat:     Mouth: Mucous membranes are dry.  Pharynx: No oropharyngeal exudate or posterior oropharyngeal erythema.  Eyes:     Extraocular Movements: Extraocular movements intact.     Pupils: Pupils are equal, round, and reactive to light.  Cardiovascular:     Rate and Rhythm: Normal rate and regular rhythm.     Pulses: Normal pulses.     Heart sounds: Normal heart sounds.  Pulmonary:     Effort: Pulmonary effort is normal.     Breath sounds: Normal breath sounds.  Musculoskeletal:     Cervical back: Normal range of motion.  Skin:    Comments: Puncture site with scab as well as surrounding erythema with warm to touch on mid upper buttocks  Neurological:     General: No focal deficit present.     Mental Status: She is alert and oriented to person, place, and time. Mental status is at baseline.  Psychiatric:        Mood and Affect: Mood normal.        Behavior: Behavior normal.         Thought Content: Thought content normal.        Judgment: Judgment normal.     Assessment & Plan:  Moderate persistent asthmatic bronchitis with acute exacerbation Assessment & Plan: Take antibiotic as prescribed. Increase oral fluids. Pt to f/u if sx worsen and or fail to improve in 2-3 days. RX doxycycline 100 mg po bid x 10 days Rx prednisone pack  Also sending in flovent to use twice daily to try to decrease need for albuterol   Orders: -     predniSONE; Take as directed  Dispense: 1 each; Refill: 0 -     Doxycycline Hyclate; Take 1 tablet (100 mg total) by mouth 2 (two) times daily for 10 days.  Dispense: 20 tablet; Refill: 0 -     Fluticasone Propionate HFA; Inhale 1 puff into the lungs in the morning and at bedtime.  Dispense: 1 each; Refill: 12  Tick bite, unspecified site, initial encounter Assessment & Plan: Appears to be clean of tick particles however with cellulitis will treat with doxycycline which will also help with bronchitis Pt advised to:  Please monitor site for worsening signs/symptoms of infection to include: increasing redness, increasing tenderness, increase in size, and or pustulant drainage from site. If this is to occur please let me know immediately.    Orders: -     Doxycycline Hyclate; Take 1 tablet (100 mg total) by mouth 2 (two) times daily for 10 days.  Dispense: 20 tablet; Refill: 0     Follow up plan: Return if symptoms worsen or fail to improve.  Mort Sawyers, FNP

## 2022-07-24 ENCOUNTER — Ambulatory Visit: Payer: No Typology Code available for payment source | Admitting: Family Medicine

## 2022-07-25 ENCOUNTER — Encounter: Payer: Self-pay | Admitting: Family

## 2022-07-25 ENCOUNTER — Ambulatory Visit (INDEPENDENT_AMBULATORY_CARE_PROVIDER_SITE_OTHER): Payer: No Typology Code available for payment source | Admitting: Family

## 2022-07-25 VITALS — BP 124/72 | HR 92 | Temp 98.7°F | Ht 67.0 in | Wt 210.6 lb

## 2022-07-25 DIAGNOSIS — B370 Candidal stomatitis: Secondary | ICD-10-CM | POA: Insufficient documentation

## 2022-07-25 DIAGNOSIS — J301 Allergic rhinitis due to pollen: Secondary | ICD-10-CM | POA: Insufficient documentation

## 2022-07-25 DIAGNOSIS — J029 Acute pharyngitis, unspecified: Secondary | ICD-10-CM | POA: Diagnosis not present

## 2022-07-25 LAB — POCT RAPID STREP A (OFFICE): Rapid Strep A Screen: NEGATIVE

## 2022-07-25 MED ORDER — FLUCONAZOLE 150 MG PO TABS
ORAL_TABLET | ORAL | 0 refills | Status: DC
Start: 2022-07-25 — End: 2022-09-04

## 2022-07-25 NOTE — Assessment & Plan Note (Signed)
Stop zyrtec start xyzal Continue flonase

## 2022-07-25 NOTE — Progress Notes (Signed)
Established Patient Office Visit  Subjective:   Patient ID: Tammy Decker, female    DOB: 28-Jun-1970  Age: 52 y.o. MRN: [id number]  CC:  Chief Complaint  Patient presents with   Sore Throat    HPI: Tammy Decker is a 52 y.o. female presenting on 07/25/2022 for Sore Throat     Sore Throat     Last seen 5/15 for nasal congestion cough and chest congestion. Dx with bronchitis and was given doxycycline 100 mg po bid x 10 days as well as prednisone. Does state was feeling better and then starting 6 days ago started with sore throat and nasal congestion. Her pain was so painful yesterday that she used some benadryl and maalox that provided her with mild relief.  She does not report heartburn.   She is taking daily zyrtec and daily flonase  Has been taking zyrtec for about four months.       Wt Readings from Last 3 Encounters:  07/25/22 210 lb 9.6 oz (95.5 kg)  07/17/22 213 lb 3.2 oz (96.7 kg)  06/26/22 218 lb (98.9 kg)      ROS: Negative unless specifically indicated above in HPI.   Relevant past medical history reviewed and updated as indicated.   Allergies and medications reviewed and updated.   Current Outpatient Medications:    azithromycin (ZITHROMAX) 250 MG tablet, Take 250 mg by mouth as directed., Disp: , Rfl:    Budesonide 90 MCG/ACT inhaler, Inhale 1 puff into the lungs 2 (two) times daily., Disp: 1 each, Rfl: 0   cyanocobalamin (VITAMIN B12) 1000 MCG tablet, Take 1,000 mcg by mouth daily., Disp: , Rfl:    doxycycline (VIBRA-TABS) 100 MG tablet, Take 1 tablet (100 mg total) by mouth 2 (two) times daily for 10 days., Disp: 20 tablet, Rfl: 0   fluconazole (DIFLUCAN) 150 MG tablet, Take one po qd for seven days, Disp: 7 tablet, Rfl: 0   folic acid (FOLVITE) 800 MCG tablet, Take 800 mcg by mouth daily., Disp: , Rfl:    metoprolol tartrate (LOPRESSOR) 25 MG tablet, Take 1 tablet (25 mg total) by mouth 2 (two) times daily., Disp: 60 tablet, Rfl: 0    nicotine (NICOTINE STEP 1) 21 mg/24hr patch, Place 1 patch (21 mg total) onto the skin daily., Disp: 28 patch, Rfl: 0   Omeprazole 20 MG TBEC, Take 20 mg by mouth See admin instructions. Take 1 tablet (20mg ) by mouth up to twice daily, Disp: , Rfl:    ondansetron (ZOFRAN-ODT) 4 MG disintegrating tablet, Take 4 mg by mouth every 8 (eight) hours as needed., Disp: , Rfl:    valsartan (DIOVAN) 320 MG tablet, Take 1 tablet (320 mg total) by mouth daily., Disp: 90 tablet, Rfl: 1  Allergies  Allergen Reactions   Ciprofloxacin Anaphylaxis and Hives   Kiwi Extract Anaphylaxis   Sulfa Antibiotics Hives    Objective:   BP 124/72   Pulse 92   Temp 98.7 F (37.1 C) (Temporal)   Ht 5\' 7"  (1.702 m)   Wt 210 lb 9.6 oz (95.5 kg)   SpO2 98%   BMI 32.98 kg/m    Physical Exam Constitutional:      General: She is not in acute distress.    Appearance: Normal appearance. She is normal weight. She is not ill-appearing, toxic-appearing or diaphoretic.  HENT:     Head: Normocephalic.     Right Ear: Tympanic membrane normal.     Left Ear: Tympanic membrane normal.  Nose: Nose normal.     Mouth/Throat:     Mouth: Mucous membranes are dry.     Pharynx: Posterior oropharyngeal erythema present. No oropharyngeal exudate.     Comments: Erythema moderate to severe upper hard palate, uvula and bil pharyngeal  Eyes:     Extraocular Movements: Extraocular movements intact.     Pupils: Pupils are equal, round, and reactive to light.  Cardiovascular:     Rate and Rhythm: Normal rate and regular rhythm.     Pulses: Normal pulses.     Heart sounds: Normal heart sounds.  Pulmonary:     Effort: Pulmonary effort is normal.     Breath sounds: Normal breath sounds.  Abdominal:     General: Bowel sounds are normal.  Musculoskeletal:     Cervical back: Normal range of motion.  Neurological:     General: No focal deficit present.     Mental Status: She is alert and oriented to person, place, and time. Mental  status is at baseline.  Psychiatric:        Mood and Affect: Mood normal.        Behavior: Behavior normal.        Thought Content: Thought content normal.        Judgment: Judgment normal.     Assessment & Plan:  Sore throat -     POCT rapid strep A -     Culture, Group A Strep  Oral candida Assessment & Plan: Moderate to severe.  Advised to wash mouth after every inhaler use  Could be from doxycycline two weeks ago  Rx oral diflucan for severity  150 mg once daily for 7 days  Pt to f/u one week with already scheduled appt  Orders: -     Fluconazole; Take one po qd for seven days  Dispense: 7 tablet; Refill: 0  Seasonal allergic rhinitis due to pollen Assessment & Plan: Stop zyrtec start xyzal Continue flonase       Follow up plan: Return if symptoms worsen or fail to improve.  Mort Sawyers, FNP

## 2022-07-25 NOTE — Assessment & Plan Note (Signed)
Moderate to severe.  Advised to wash mouth after every inhaler use  Could be from doxycycline two weeks ago  Rx oral diflucan for severity  150 mg once daily for 7 days  Pt to f/u one week with already scheduled appt

## 2022-07-27 LAB — CULTURE, GROUP A STREP
MICRO NUMBER:: 14995818
SPECIMEN QUALITY:: ADEQUATE

## 2022-07-31 ENCOUNTER — Other Ambulatory Visit (HOSPITAL_COMMUNITY)
Admission: RE | Admit: 2022-07-31 | Discharge: 2022-07-31 | Disposition: A | Discharge status: Home / Self Care | Payer: PRIVATE HEALTH INSURANCE | Source: Ambulatory Visit | Attending: Family | Admitting: Family

## 2022-07-31 ENCOUNTER — Ambulatory Visit (INDEPENDENT_AMBULATORY_CARE_PROVIDER_SITE_OTHER): Payer: No Typology Code available for payment source | Admitting: Family

## 2022-07-31 ENCOUNTER — Ambulatory Visit: Payer: PRIVATE HEALTH INSURANCE | Admitting: Urology

## 2022-07-31 ENCOUNTER — Encounter: Payer: Self-pay | Admitting: Family

## 2022-07-31 VITALS — BP 128/84 | HR 120 | Temp 98.0°F | Ht 67.0 in | Wt 210.0 lb

## 2022-07-31 DIAGNOSIS — Z1272 Encounter for screening for malignant neoplasm of vagina: Secondary | ICD-10-CM

## 2022-07-31 DIAGNOSIS — E7800 Pure hypercholesterolemia, unspecified: Secondary | ICD-10-CM

## 2022-07-31 DIAGNOSIS — R7303 Prediabetes: Secondary | ICD-10-CM

## 2022-07-31 DIAGNOSIS — Z1231 Encounter for screening mammogram for malignant neoplasm of breast: Secondary | ICD-10-CM | POA: Diagnosis not present

## 2022-07-31 DIAGNOSIS — E538 Deficiency of other specified B group vitamins: Secondary | ICD-10-CM | POA: Insufficient documentation

## 2022-07-31 DIAGNOSIS — L989 Disorder of the skin and subcutaneous tissue, unspecified: Secondary | ICD-10-CM | POA: Insufficient documentation

## 2022-07-31 DIAGNOSIS — Z01419 Encounter for gynecological examination (general) (routine) without abnormal findings: Secondary | ICD-10-CM | POA: Insufficient documentation

## 2022-07-31 DIAGNOSIS — Z202 Contact with and (suspected) exposure to infections with a predominantly sexual mode of transmission: Secondary | ICD-10-CM

## 2022-07-31 LAB — LIPID PANEL
Cholesterol: 196 mg/dL (ref 0–200)
HDL: 30.8 mg/dL — ABNORMAL LOW (ref >39.00)
LDL Cholesterol: 139 mg/dL — ABNORMAL HIGH (ref 0–99)
NonHDL: 164.97
Total CHOL/HDL Ratio: 6
Triglycerides: 130 mg/dL (ref 0.0–149.0)
VLDL: 26 mg/dL (ref 0.0–40.0)

## 2022-07-31 LAB — HEMOGLOBIN A1C: Hgb A1c MFr Bld: 6.3 % (ref 4.6–6.5)

## 2022-07-31 LAB — VITAMIN B12: Vitamin B-12: 211 pg/mL (ref 211–911)

## 2022-07-31 NOTE — Progress Notes (Signed)
Subjective:  Patient ID: Tammy Decker, female    DOB: 01/08/1971  Age: 52 y.o. MRN: [id number]  Patient Care Team: Mort Sawyers, FNP as PCP - General (Family Medicine) Wenda Overland, [place] as Social Worker   CC:  Chief Complaint  Patient presents with   Annual Exam    HPI Kyrsti Chesson is a 52 y.o. female who presents today for a well woman exam. She reports consuming a general diet.  Trying to work on walking, not often  She generally feels well. She reports sleeping well. She does not have additional problems to discuss today.   Vision:Not within last year Dental:Receives regular dental care STD:The patient denies history of sexually transmitted disease.   Mammogram: years ago  Last pap: years ago  Colonoscopy: 2022?   Pt is without acute concerns.   Advanced Directives Patient does have advanced directives including living will. She does not have a copy in the electronic medical record.   DEPRESSION SCREENING    07/31/2022    8:13 AM 07/17/2022    8:32 AM 06/26/2022    8:33 AM 04/23/2022   11:03 AM 01/22/2022    2:16 PM 12/07/2021    3:41 PM 09/11/2020   11:44 AM  PHQ 2/9 Scores  PHQ - 2 Score 0  1 0  5 6  PHQ- 9 Score    2  21 24   Exception Documentation  Other- indicate reason in comment box          Information is confidential and restricted. Go to Review Flowsheets to unlock data.     ROS: Negative unless specifically indicated above in HPI.    Current Outpatient Medications:    Budesonide 90 MCG/ACT inhaler, Inhale 1 puff into the lungs 2 (two) times daily., Disp: 1 each, Rfl: 0   cyanocobalamin (VITAMIN B12) 1000 MCG tablet, Take 1,000 mcg by mouth daily., Disp: , Rfl:    fluconazole (DIFLUCAN) 150 MG tablet, Take one po qd for seven days, Disp: 7 tablet, Rfl: 0   folic acid (FOLVITE) 800 MCG tablet, Take 800 mcg by mouth daily., Disp: , Rfl:    metoprolol tartrate (LOPRESSOR) 25 MG tablet, Take 1 tablet (25 mg total) by mouth 2 (two)  times daily., Disp: 60 tablet, Rfl: 0   Omeprazole 20 MG TBEC, Take 20 mg by mouth See admin instructions. Take 1 tablet (20mg ) by mouth up to twice daily, Disp: , Rfl:    ondansetron (ZOFRAN-ODT) 4 MG disintegrating tablet, Take 4 mg by mouth every 8 (eight) hours as needed., Disp: , Rfl:    valsartan (DIOVAN) 320 MG tablet, Take 1 tablet (320 mg total) by mouth daily., Disp: 90 tablet, Rfl: 1    Objective:    BP 128/84 (BP Location: Left Arm)   Pulse (!) 120   Temp 98 F (36.7 C) (Temporal)   Ht 5\' 7"  (1.702 m)   Wt 210 lb (95.3 kg)   SpO2 98%   BMI 32.89 kg/m   BP Readings from Last 3 Encounters:  07/31/22 128/84  07/25/22 124/72  07/17/22 128/82      @PHYSEXAMBYAGE @  Gen: NAD, resting comfortably Breasts: breasts appear normal, no suspicious masses, no skin or nipple changes or axillary nodes Physical Exam Genitourinary:    General: Normal vulva.     Pubic Area: No rash.      Labia:        Right: No rash, tenderness, lesion or injury.  Left: No rash, tenderness, lesion or injury.      Urethra: No prolapse or urethral pain.     Vagina: Normal. No vaginal discharge, tenderness or lesions.     Cervix: No discharge.     Rectum: Normal.  Psych: Normal affect and thought content  Skin: brown raised lesion right inner groin area Small annular  lesion raised skin colored on left inner cheek     Assessment & Plan:  Screening mammogram for breast cancer -     3D Screening Mammogram, Left and Right; Future -     Cytology - PAP  Encounter for Papanicolaou smear of vagina as part of routine gynecological examination Assessment & Plan: Pt verbalized consent for pelvic exam Declines std testing hpv thin prep ordered and pending results Pap exam in office completed, pt tolerated well.   Patient Counseling(The following topics were reviewed):  Preventative care handout given to pt  Health maintenance and immunizations reviewed. Please refer to Health maintenance  section. Pt advised on safe sex, wearing seatbelts in car, and proper nutrition labwork ordered today for annual Dental health: Discussed importance of regular tooth brushing, flossing, and dental visits.    Orders: -     Cytology - PAP -     Herpes Simplex Virus 1 and 2 (IgG), with Reflex to HSV-2 Inhibition  Encounter for assessment of STD exposure -     RPR -     HIV Antibody (routine testing w rflx) -     Hepatitis C antibody -     Chlamydia/Gonococcus/Trichomonas, NAA  Low serum vitamin B12 -     Vitamin B12  Elevated LDL cholesterol level -     Lipid panel  Prediabetes Assessment & Plan: Pt advised of the following: Work on a diabetic diet, try to incorporate exercise at least 20-30 a day for 3 days a week or more.    Orders: -     Hemoglobin A1c  Skin lesion Assessment & Plan: Referral placed for dermatology for evaluation   Orders: -     Ambulatory referral to Dermatology      Follow-up: No follow-ups on file.   Mort Sawyers, FNP

## 2022-07-31 NOTE — Assessment & Plan Note (Signed)
Pt advised of the following: Work on a diabetic diet, try to incorporate exercise at least 20-30 a day for 3 days a week or more.   

## 2022-07-31 NOTE — Patient Instructions (Addendum)
  I have sent an electronic order over to your preferred location for the following:   []   2D Mammogram  [x]   3D Mammogram  []   Bone Density   Please give this center a call to get scheduled at your convenience.  [x]   [facility]  [address] [place] [address]  [phone]  Make sure to wear two piece  clothing  No lotions powders or deodorants the day of the appointment Make sure to bring picture ID and insurance card.  Bring list of medications you are currently taking including any supplements.    ------------------------------------ A referral was placed today for dermatology  Please let us know if you have not heard back within 2 weeks about the referral.  ------------------------------------   Regards,   Mort Sawyers FNP-C

## 2022-07-31 NOTE — Assessment & Plan Note (Signed)
Pt verbalized consent for pelvic exam Declines std testing hpv thin prep ordered and pending results Pap exam in office completed, pt tolerated well.   Patient Counseling(The following topics were reviewed):  Preventative care handout given to pt  Health maintenance and immunizations reviewed. Please refer to Health maintenance section. Pt advised on safe sex, wearing seatbelts in car, and proper nutrition labwork ordered today for annual Dental health: Discussed importance of regular tooth brushing, flossing, and dental visits.  

## 2022-07-31 NOTE — Assessment & Plan Note (Signed)
Referral placed for dermatology for evaluation

## 2022-08-01 ENCOUNTER — Encounter: Payer: Self-pay | Admitting: Family

## 2022-08-01 DIAGNOSIS — E7800 Pure hypercholesterolemia, unspecified: Secondary | ICD-10-CM

## 2022-08-01 LAB — HIV ANTIBODY (ROUTINE TESTING W REFLEX): HIV 1&2 Ab, 4th Generation: NONREACTIVE

## 2022-08-01 LAB — HEPATITIS C ANTIBODY: Hepatitis C Ab: NONREACTIVE

## 2022-08-01 LAB — RPR: RPR Ser Ql: NONREACTIVE

## 2022-08-02 LAB — CHLAMYDIA/GONOCOCCUS/TRICHOMONAS, NAA
Chlamydia by NAA: NEGATIVE
Gonococcus by NAA: NEGATIVE
Trich vag by NAA: NEGATIVE

## 2022-08-05 ENCOUNTER — Encounter: Payer: Self-pay | Admitting: Family

## 2022-08-05 ENCOUNTER — Ambulatory Visit (INDEPENDENT_AMBULATORY_CARE_PROVIDER_SITE_OTHER): Payer: No Typology Code available for payment source | Admitting: Surgery

## 2022-08-05 ENCOUNTER — Encounter: Payer: Self-pay | Admitting: Surgery

## 2022-08-05 VITALS — BP 125/81 | HR 101 | Temp 97.9°F | Ht 67.0 in | Wt 209.8 lb

## 2022-08-05 DIAGNOSIS — R768 Other specified abnormal immunological findings in serum: Secondary | ICD-10-CM | POA: Insufficient documentation

## 2022-08-05 DIAGNOSIS — Z9689 Presence of other specified functional implants: Secondary | ICD-10-CM

## 2022-08-05 DIAGNOSIS — F17210 Nicotine dependence, cigarettes, uncomplicated: Secondary | ICD-10-CM | POA: Diagnosis not present

## 2022-08-05 MED ORDER — ATORVASTATIN CALCIUM 10 MG PO TABS
10.0000 mg | ORAL_TABLET | Freq: Every day | ORAL | 3 refills | Status: DC
Start: 2022-08-05 — End: 2023-01-09

## 2022-08-05 NOTE — Progress Notes (Signed)
Outpatient Surgical Follow Up  08/05/2022  Tammy Decker is an 52 y.o. female.   Chief Complaint  Patient presents with   Follow-up    Ventral hernia repair 05/09/20    HPI: Doing much better from abd perspective. She sis have two episodes of alcoholic pancreatitis requiring hospitalization. CT pers reviewed and no evidence of hernia recurrence. 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 I congratulated her for her efforts  Past Medical History:  Diagnosis Date   Asthma    Complication of anesthesia    OXYGEN SOMETIMES DROPS LOW   GERD (gastroesophageal reflux disease)    Hypertension    Pyelonephritis    Tachycardia 2020    Past Surgical History:  Procedure Laterality Date   BACK SURGERY     CHOLECYSTECTOMY     ESOPHAGOGASTRODUODENOSCOPY (EGD) WITH PROPOFOL N/A 06/19/2021   Procedure: ESOPHAGOGASTRODUODENOSCOPY (EGD) WITH PROPOFOL;  Surgeon: Toney Reil, MD;  Location: [facility] ENDOSCOPY;  Service: Gastroenterology;  Laterality: N/A;   NECK SURGERY     uterine ablation     XI ROBOTIC ASSISTED VENTRAL HERNIA N/A 05/09/2020   Procedure: XI ROBOTIC ASSISTED VENTRAL HERNIA, incisional hernia;  Surgeon: Leafy Ro, MD;  Location: [facility] ORS;  Service: General;  Laterality: N/A;    Family History  Problem Relation Age of Onset   Diabetes Mother    Diabetes Father    Heart attack Father    Ovarian cancer Maternal Grandmother        older dx maybe early 34s   Colon cancer Maternal Grandfather        in his 37's   Early death Paternal Grandfather    Heart attack Paternal Grandfather     Social History:  reports that she has been smoking cigarettes. She has been smoking an average of .15 packs per day. She has been exposed to tobacco smoke. She has never used smokeless tobacco. She reports current alcohol use. She reports that she does not use drugs.  Allergies:  Allergies  Allergen Reactions   Ciprofloxacin Anaphylaxis and Hives    Kiwi Extract Anaphylaxis   Sulfa Antibiotics Hives    Medications reviewed.    ROS Full ROS performed and is otherwise negative other than what is stated in HPI   BP 125/81   Pulse (!) 101   Temp 97.9 F (36.6 C) (Oral)   Ht 5\' 7"  (1.702 m)   Wt 209 lb 12.8 oz (95.2 kg)   SpO2 97%   BMI 32.86 kg/m   Physical Exam Vitals and nursing note reviewed.  Constitutional:      Appearance: Normal appearance. She is not ill-appearing.  Pulmonary:     Effort: Pulmonary effort is normal.     Breath sounds: Normal breath sounds.  Abdominal:     General: Abdomen is flat. There is no distension.     Palpations: Abdomen is soft. There is no mass.     Tenderness: There is no abdominal tenderness. There is no guarding.     Hernia: No hernia is present.  Musculoskeletal:        General: Normal range of motion.  Skin:    General: Skin is warm and dry.     Capillary Refill: Capillary refill takes less than 2 seconds.     Comments: Right  nerve stimulator located sub q tissue, no infection  Neurological:     General: No focal deficit present.     Mental Status: She  is alert and oriented to person, place, and time.  Psychiatric:        Mood and Affect: Mood normal.        Behavior: Behavior normal.        Thought Content: Thought content normal.        Judgment: Judgment normal.      Assessment/Plan: S/p ventral hernia repair, no evidence of recurrence.  ETOH remission  Pancreatitis resolved  Refer to NS for removal of nerve stimulator I spent 20 min in this encounter including personally rev images, coordinating her care and performing documentation   Sterling Big, MD [facility] General Surgeon

## 2022-08-05 NOTE — Patient Instructions (Addendum)
A referral has been placed to Dr. Marcell Barlow (Neurosurgeon). They will call you for an appointment.    If you have any concerns or questions, please feel free to call our office.

## 2022-08-06 LAB — CYTOLOGY - PAP
Comment: NEGATIVE
Diagnosis: NEGATIVE
High risk HPV: NEGATIVE

## 2022-08-06 NOTE — Progress Notes (Unsigned)
    Santiaga Butzin T. Joyel Chenette, MD, CAQ Sports Medicine [place] at [facility] [address] [place], [address]  Phone: [phone]  FAX: [phone]  Tanyia Hyre - 52 y.o. female  MRN [id number]  Date of Birth: 12-Sep-1970  Date: 08/07/2022  PCP: Mort Sawyers, FNP  Referral: Mort Sawyers, FNP  No chief complaint on file.  Subjective:   Zorah Bussinger is a 52 y.o. very pleasant female patient with There is no height or weight on file to calculate BMI. who presents with the following:  She is a very pleasant young lady with some ongoing left-sided shoulder pain.    Review of Systems is noted in the HPI, as appropriate  Objective:   There were no vitals taken for this visit.  GEN: No acute distress; alert,appropriate. PULM: Breathing comfortably in no respiratory distress PSYCH: Normally interactive.   Laboratory and Imaging Data:  Assessment and Plan:   ***

## 2022-08-07 ENCOUNTER — Encounter: Payer: Self-pay | Admitting: Family Medicine

## 2022-08-07 ENCOUNTER — Ambulatory Visit (INDEPENDENT_AMBULATORY_CARE_PROVIDER_SITE_OTHER): Payer: No Typology Code available for payment source | Admitting: Family Medicine

## 2022-08-07 VITALS — BP 120/84 | HR 117 | Temp 97.6°F | Ht 67.0 in

## 2022-08-07 DIAGNOSIS — S4352XA Sprain of left acromioclavicular joint, initial encounter: Secondary | ICD-10-CM | POA: Diagnosis not present

## 2022-08-07 DIAGNOSIS — M25512 Pain in left shoulder: Secondary | ICD-10-CM | POA: Diagnosis not present

## 2022-08-07 DIAGNOSIS — S4362XA Sprain of left sternoclavicular joint, initial encounter: Secondary | ICD-10-CM | POA: Diagnosis not present

## 2022-08-07 MED ORDER — PREDNISONE 20 MG PO TABS: ORAL_TABLET | ORAL | 0 refills | Status: DC

## 2022-08-08 IMAGING — CT CT ABD-PELV W/ CM
2 of 5 series · 15 of 46 positions shown, 17 images · IV contrast (agent unspecified)
Comparison: June 21, 2020

CLINICAL DATA: Mid abdominal pain radiating to the right side.
History of umbilical hernia repair.

EXAM:
CT ABDOMEN AND PELVIS WITH CONTRAST
TECHNIQUE: Multidetector CT imaging of the abdomen and pelvis was performed
using the standard protocol following bolus administration of
intravenous contrast.

[Series 2: abd pelvis 5.00 · axial · 0.85mm/px · z∈[-1524,-1094]mm · 12 of 102 slices shown, 14 images]
[im 8/102  soft-tissue]
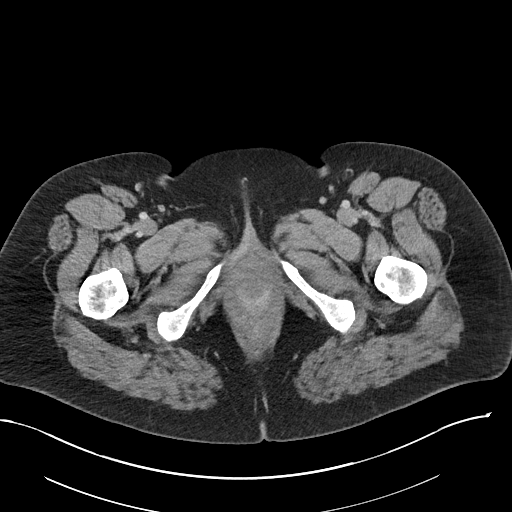
[im 8/102  bone]
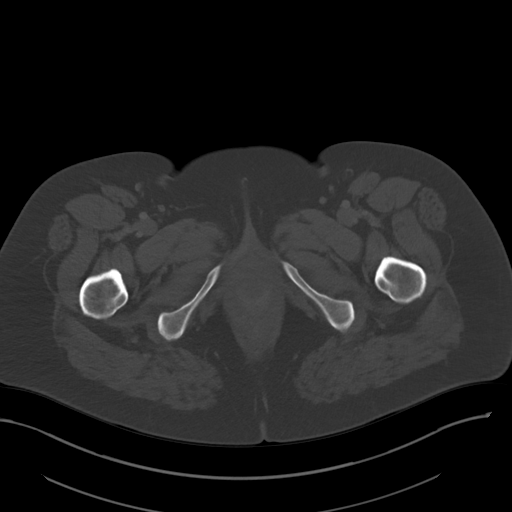
[im 16/102  soft-tissue]
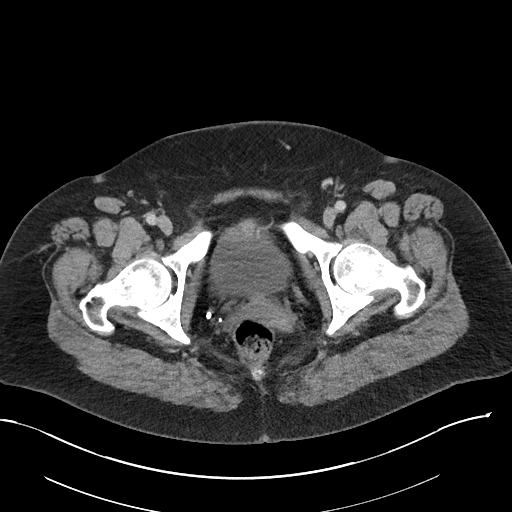
[im 24/102  soft-tissue]
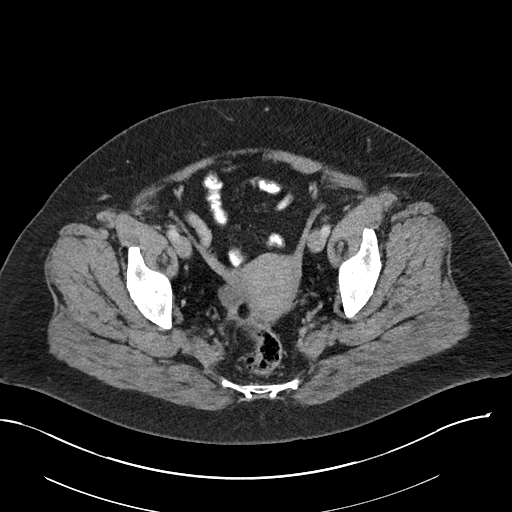
[im 32/102  soft-tissue]
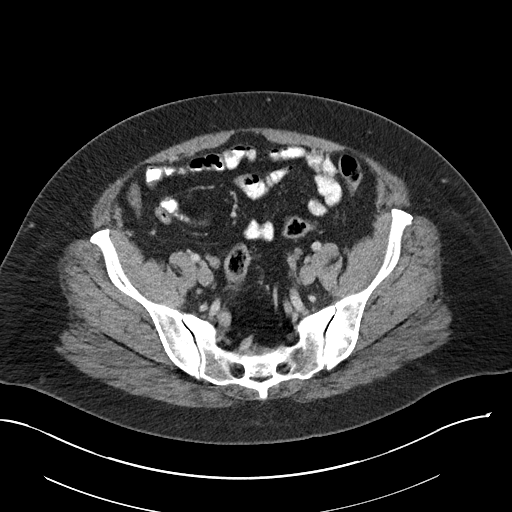
[im 39/102  soft-tissue]
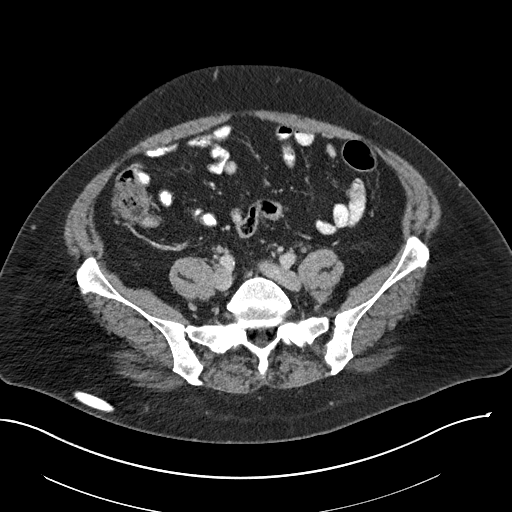
[im 47/102  soft-tissue]
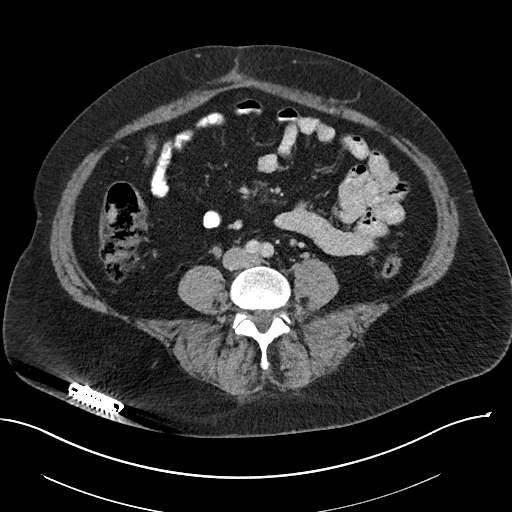
[im 55/102  soft-tissue]
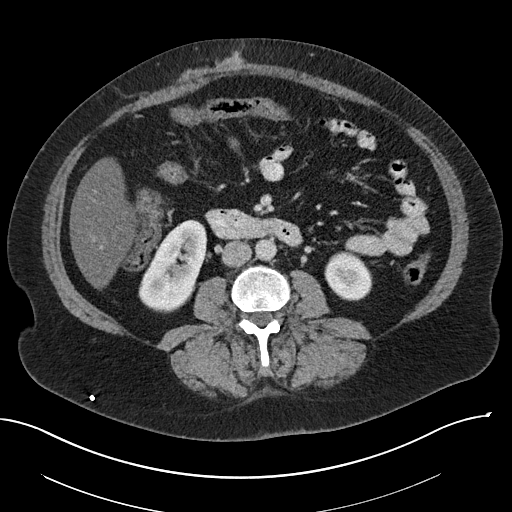
[im 63/102  soft-tissue]
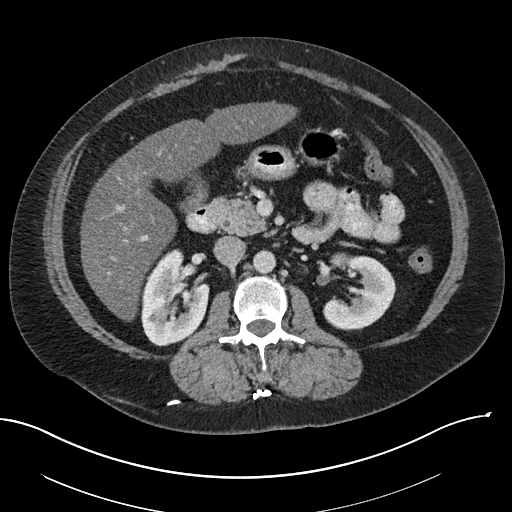
[im 70/102  soft-tissue]
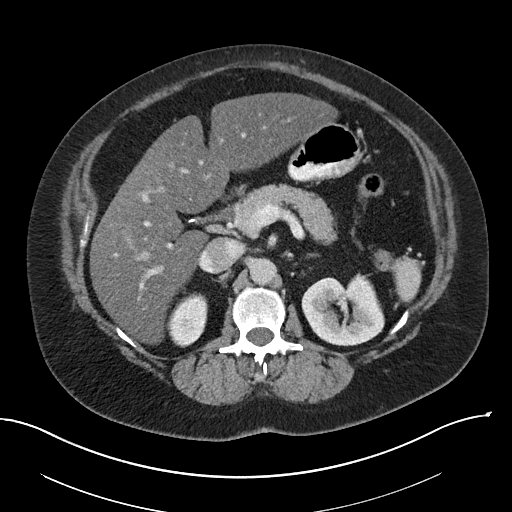
[im 70/102  bone]
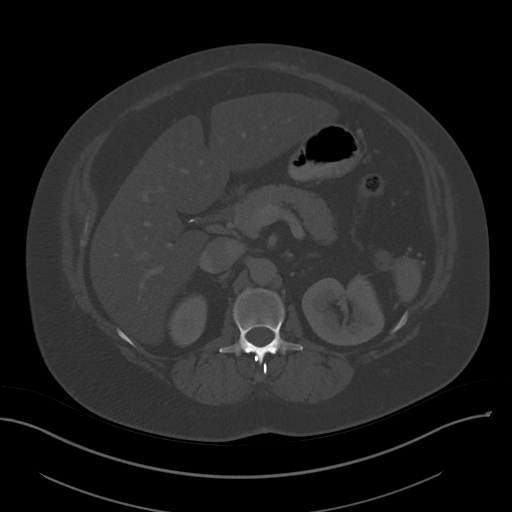
[im 78/102  soft-tissue]
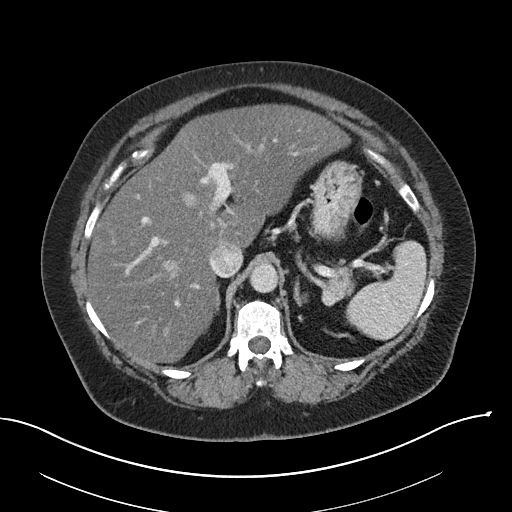
[im 86/102  soft-tissue]
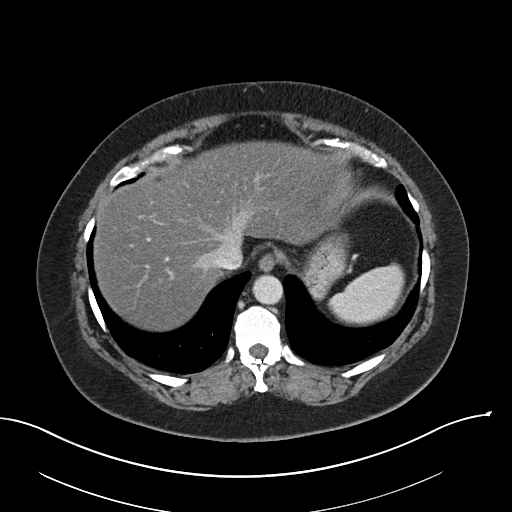
[im 94/102  soft-tissue]
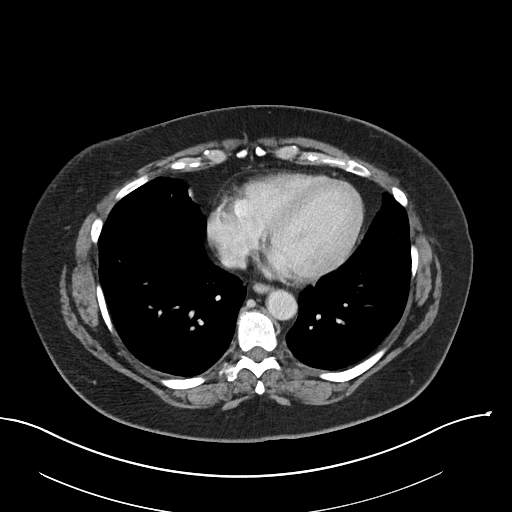

[Series 4: coronals abd pelvis 2.00 cor · coronal · 0.85mm/px · 3 of 174 slices shown]
[im 58/174  soft-tissue]
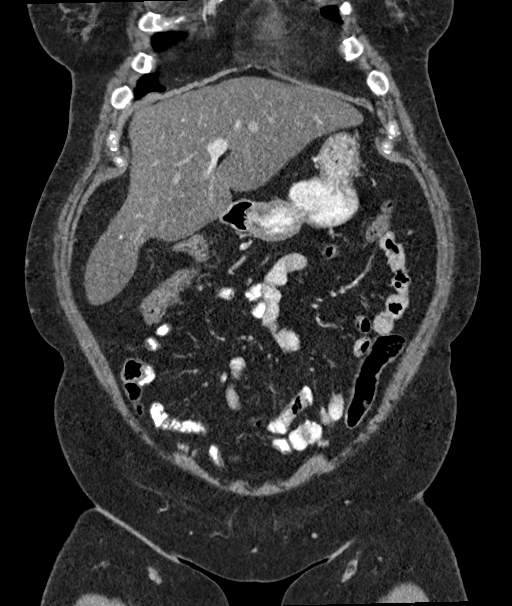
[im 77/174  soft-tissue]
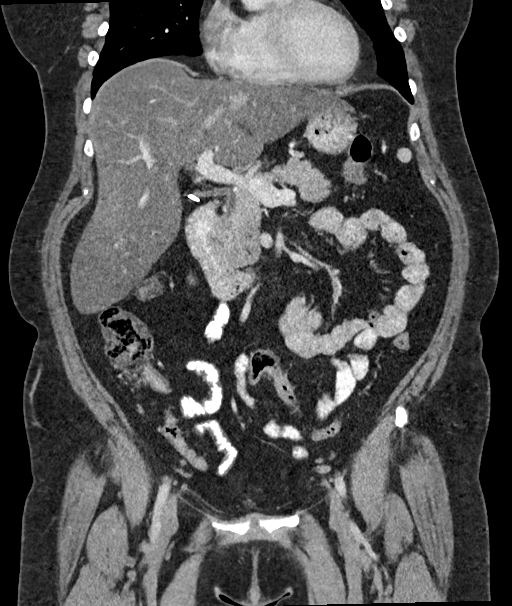
[im 97/174  soft-tissue]
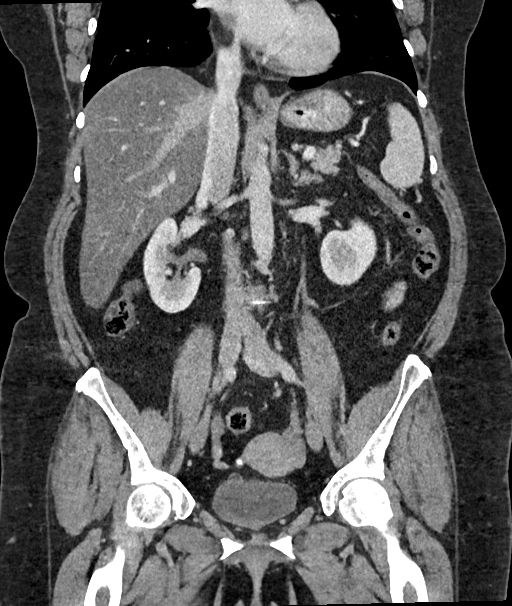

[15 of 46 positions shown; findings below may reference images not displayed]

RADIATION DOSE REDUCTION: This exam was performed according to the
departmental dose-optimization program which includes automated
exposure control, adjustment of the mA and/or kV according to
patient size and/or use of iterative reconstruction technique.

CONTRAST:  100mL OMNIPAQUE IOHEXOL 300 MG/ML  SOLN
FINDINGS: Lower chest: No acute abnormality.

Hepatobiliary: Diffuse hepatic steatosis. No suspicious hepatic
lesion. Gallbladder surgically absent. No biliary ductal dilation.

Pancreas: No pancreatic ductal dilation or evidence of acute
inflammation.

Spleen: Calcified splenic granulomata.  No splenomegaly.

Adrenals/Urinary Tract: Bilateral adrenal glands appear normal. No
hydronephrosis. Subcentimeter right renal lesion is technically too
small to accurately characterize but statistically likely reflect
cysts. Urinary bladder is unremarkable for degree of distension.

Stomach/Bowel: Radiopaque enteric contrast material traverses distal
loops of small bowel. Stomach is unremarkable for degree of
distension. No pathologic dilation of large or small bowel. The
appendix and terminal ileum appear normal. No evidence of acute
bowel inflammation.

Vascular/Lymphatic: Normal caliber abdominal aorta. No
pathologically enlarged abdominal or pelvic lymph nodes.

Reproductive: Uterus and bilateral adnexa are unremarkable.

Other: Prior ventral hernia repair. No walled off fluid collections.
No significant abdominopelvic free fluid.

Musculoskeletal: Spinal stimulating generator in the right posterior
gluteal subcutaneous soft tissues with intra spinal canal lead
terminating at the T9 vertebral body level. Multilevel degenerative
changes spine. No acute osseous finding.
IMPRESSION: 1. No acute abdominopelvic findings.
2. Prior ventral hernia repair without significant adjacent
inflammation or drainable fluid collection.
3. Hepatic steatosis.

## 2022-08-10 LAB — HERPES SIMPLEX VIRUS 1 AND 2 (IGG),REFLEX HSV-2 INHIBITION
HAV 1 IGG,TYPE SPECIFIC AB: <0.9 index
HSV 2 IGG,TYPE SPECIFIC AB: 15.4 index — ABNORMAL HIGH

## 2022-08-10 LAB — HSV 2 INHIBITION: HSV 2 IGG INHIBITION,IA: POSITIVE — AB

## 2022-08-14 NOTE — Addendum Note (Signed)
Addended by: Mort Sawyers on: 08/14/2022 11:50 AM   Modules accepted: Level of Service

## 2022-08-27 NOTE — Progress Notes (Deleted)
    Robey Massmann T. Jarrah Babich, MD, CAQ Sports Medicine [facility] at [facility] [address] [place], [address]  Phone: [phone]  FAX: [phone]  Tammy Decker - 52 y.o. female  MRN [id number]  Date of Birth: 10/27/1970  Date: 08/28/2022  PCP: Mort Sawyers, FNP  Referral: Mort Sawyers, FNP  No chief complaint on file.  Subjective:   Tammy Decker is a 52 y.o. very pleasant female patient with There is no height or weight on file to calculate BMI. who presents with the following:  She is a very pleasant patient, and I saw her roughly 3 weeks ago.  At that time predominant pain was at the sternoclavicular joint and to lesser extent the acromioclavicular joint.  Due to some trauma, felt like her issues were predominantly [facility] joint as well as sternoclavicular joint sprain.  She presents today in follow-up.  She was having some neck pain at the time as well as some posterior trapezius pain, this is improved.    Review of Systems is noted in the HPI, as appropriate  Objective:   There were no vitals taken for this visit.  GEN: No acute distress; alert,appropriate. PULM: Breathing comfortably in no respiratory distress PSYCH: Normally interactive.   Laboratory and Imaging Data:  Assessment and Plan:   ***

## 2022-08-28 ENCOUNTER — Ambulatory Visit: Payer: No Typology Code available for payment source | Admitting: Family Medicine

## 2022-08-28 DIAGNOSIS — M25512 Pain in left shoulder: Secondary | ICD-10-CM

## 2022-08-29 ENCOUNTER — Encounter: Payer: Self-pay | Admitting: Family

## 2022-09-03 NOTE — Progress Notes (Unsigned)
    Tammy Ehrhard T. Gerik Coberly, MD, CAQ Sports Medicine [facility] at [facility] [address] [place], [address]  Phone: [phone]  FAX: [phone]  Tammy Decker - 52 y.o. female  MRN [id number]  Date of Birth: 03/13/1970  Date: 09/04/2022  PCP: Mort Sawyers, FNP  Referral: Mort Sawyers, FNP  No chief complaint on file.  Subjective:   Tammy Decker is a 52 y.o. very pleasant female patient with There is no height or weight on file to calculate BMI. who presents with the following:  Patient presents with ongoing shoulder pain.  I did see her roughly 1 month ago, and at that point I thought that she was having sternoclavicular pain.  She presents today with ongoing follow-up for pain and loss of motion.    Review of Systems is noted in the HPI, as appropriate  Objective:   There were no vitals taken for this visit.  GEN: No acute distress; alert,appropriate. PULM: Breathing comfortably in no respiratory distress PSYCH: Normally interactive.   Laboratory and Imaging Data:  Assessment and Plan:   ***

## 2022-09-04 ENCOUNTER — Ambulatory Visit (INDEPENDENT_AMBULATORY_CARE_PROVIDER_SITE_OTHER)
Admission: RE | Admit: 2022-09-04 | Discharge: 2022-09-04 | Disposition: A | Discharge status: Home / Self Care | Payer: PRIVATE HEALTH INSURANCE | Source: Ambulatory Visit | Attending: Family Medicine | Admitting: Family Medicine

## 2022-09-04 ENCOUNTER — Encounter: Payer: Self-pay | Admitting: Family Medicine

## 2022-09-04 ENCOUNTER — Ambulatory Visit: Payer: No Typology Code available for payment source | Admitting: Family Medicine

## 2022-09-04 VITALS — BP 120/80 | HR 93 | Temp 98.0°F | Ht 67.0 in | Wt 203.5 lb

## 2022-09-04 DIAGNOSIS — M25512 Pain in left shoulder: Secondary | ICD-10-CM | POA: Diagnosis not present

## 2022-09-04 DIAGNOSIS — M25612 Stiffness of left shoulder, not elsewhere classified: Secondary | ICD-10-CM | POA: Diagnosis not present

## 2022-09-04 DIAGNOSIS — M7502 Adhesive capsulitis of left shoulder: Secondary | ICD-10-CM | POA: Diagnosis not present

## 2022-09-10 ENCOUNTER — Ambulatory Visit: Admission: RE | Admit: 2022-09-10 | Payer: No Typology Code available for payment source | Source: Ambulatory Visit

## 2022-09-26 ENCOUNTER — Encounter: Payer: Self-pay | Admitting: Cardiology

## 2022-10-03 ENCOUNTER — Ambulatory Visit: Admission: RE | Admit: 2022-10-03 | Payer: No Typology Code available for payment source | Source: Ambulatory Visit

## 2022-10-07 ENCOUNTER — Ambulatory Visit: Payer: PRIVATE HEALTH INSURANCE | Admitting: Cardiology

## 2022-11-14 ENCOUNTER — Encounter: Payer: Self-pay | Admitting: Family

## 2022-11-14 ENCOUNTER — Ambulatory Visit (INDEPENDENT_AMBULATORY_CARE_PROVIDER_SITE_OTHER): Payer: No Typology Code available for payment source | Admitting: Family

## 2022-11-14 VITALS — BP 126/80 | HR 92 | Temp 98.0°F | Ht 67.0 in | Wt 201.8 lb

## 2022-11-14 DIAGNOSIS — M6289 Other specified disorders of muscle: Secondary | ICD-10-CM | POA: Diagnosis not present

## 2022-11-14 DIAGNOSIS — R7303 Prediabetes: Secondary | ICD-10-CM

## 2022-11-14 DIAGNOSIS — Z79899 Other long term (current) drug therapy: Secondary | ICD-10-CM

## 2022-11-14 DIAGNOSIS — W57XXXA Bitten or stung by nonvenomous insect and other nonvenomous arthropods, initial encounter: Secondary | ICD-10-CM | POA: Insufficient documentation

## 2022-11-14 DIAGNOSIS — R7989 Other specified abnormal findings of blood chemistry: Secondary | ICD-10-CM

## 2022-11-14 DIAGNOSIS — I10 Essential (primary) hypertension: Secondary | ICD-10-CM

## 2022-11-14 DIAGNOSIS — E538 Deficiency of other specified B group vitamins: Secondary | ICD-10-CM | POA: Diagnosis not present

## 2022-11-14 LAB — COMPREHENSIVE METABOLIC PANEL
ALT: 7 U/L (ref 0–35)
AST: 14 U/L (ref 0–37)
Albumin: 4 g/dL (ref 3.5–5.2)
Alkaline Phosphatase: 116 U/L (ref 39–117)
BUN: 5 mg/dL — ABNORMAL LOW (ref 6–23)
CO2: 28 meq/L (ref 19–32)
Calcium: 9.5 mg/dL (ref 8.4–10.5)
Chloride: 102 meq/L (ref 96–112)
Creatinine, Ser: 0.56 mg/dL (ref 0.40–1.20)
GFR: 106.26 mL/min (ref >60.00)
Glucose, Bld: 111 mg/dL — ABNORMAL HIGH (ref 70–99)
Potassium: 4.5 meq/L (ref 3.5–5.1)
Sodium: 137 meq/L (ref 135–145)
Total Bilirubin: 0.3 mg/dL (ref 0.2–1.2)
Total Protein: 7.6 g/dL (ref 6.0–8.3)

## 2022-11-14 LAB — LIPID PANEL
Cholesterol: 190 mg/dL (ref 0–200)
HDL: 41.1 mg/dL (ref >39.00)
LDL Cholesterol: 130 mg/dL — ABNORMAL HIGH (ref 0–99)
NonHDL: 148.93
Total CHOL/HDL Ratio: 5
Triglycerides: 96 mg/dL (ref 0.0–149.0)
VLDL: 19.2 mg/dL (ref 0.0–40.0)

## 2022-11-14 LAB — CK: Total CK: 40 U/L (ref 7–177)

## 2022-11-14 LAB — B12 AND FOLATE PANEL
Folate: 4.6 ng/mL — ABNORMAL LOW (ref >5.9)
Vitamin B-12: 162 pg/mL — ABNORMAL LOW (ref 211–911)

## 2022-11-14 LAB — MAGNESIUM: Magnesium: 1.8 mg/dL (ref 1.5–2.5)

## 2022-11-14 LAB — HEMOGLOBIN A1C: Hgb A1c MFr Bld: 6 % (ref 4.6–6.5)

## 2022-11-14 MED ORDER — VALSARTAN 160 MG PO TABS: 160.0000 mg | ORAL_TABLET | Freq: Every day | ORAL | 3 refills | Status: DC

## 2022-11-14 MED ORDER — TIZANIDINE HCL 4 MG PO TABS
ORAL_TABLET | ORAL | 0 refills | Status: DC
Start: 2022-11-14 — End: 2022-11-24

## 2022-11-14 MED ORDER — CYANOCOBALAMIN 1000 MCG/ML IJ SOLN
1000.0000 ug | Freq: Once | INTRAMUSCULAR | Status: AC
Start: 2022-11-14 — End: 2022-11-14
  Administered 2022-11-14: 1000 ug via INTRAMUSCULAR

## 2022-11-14 MED ORDER — CLOBETASOL PROPIONATE 0.05 % EX CREA
1.0000 | TOPICAL_CREAM | Freq: Two times a day (BID) | CUTANEOUS | 0 refills | Status: DC
Start: 2022-11-14 — End: 2023-01-09

## 2022-11-14 NOTE — Patient Instructions (Addendum)
  Restart valsartan at decreased dose 160 mg once daily.  Get with cardiologist as you are overdue for follow up and need to discuss other treatment for tachycardia since metoprolol undesired.   Trial d/c atorvastatin x 1 week to see if muscle pains improved.    Regards,   Mort Sawyers FNP-C

## 2022-11-14 NOTE — Progress Notes (Signed)
Established Patient Office Visit  Subjective:      CC:  Chief Complaint  Patient presents with   Follow-up    Tick bite    HPI: Tammy Decker is a 52 y.o. female presenting on 11/14/2022 for Follow-up (Tick bite) .    Starting a new job next week, excited about this.   Muscles hurt her over the last one month, at times hard to work. Trying to work on exercises to stretch her muscles. She states that she can see her muscles tight. She states the muscle tightness worse after sitting for long periods of time. No new medications   Wt Readings from Last 3 Encounters:  11/14/22 201 lb 12.8 oz (91.5 kg)  09/04/22 203 lb 8 oz (92.3 kg)  08/05/22 209 lb 12.8 oz (95.2 kg)   Hypertension: pt self d/c her valsartan 320 mg once daily. She states she self d/c taking metoprolol however it 'makes her feel horrible'. She was due for f/u 8/24 with cardiology but she states her cardiologist left the office.   Wt Readings from Last 3 Encounters:  11/14/22 201 lb 12.8 oz (91.5 kg)  09/04/22 203 lb 8 oz (92.3 kg)  08/05/22 209 lb 12.8 oz (95.2 kg)   Temp Readings from Last 3 Encounters:  11/14/22 98 F (36.7 C) (Temporal)  09/04/22 98 F (36.7 C) (Temporal)  08/07/22 97.6 F (36.4 C) (Temporal)   BP Readings from Last 3 Encounters:  11/14/22 126/80  09/04/22 120/80  08/07/22 120/84   Pulse Readings from Last 3 Encounters:  11/14/22 92  09/04/22 93  08/07/22 (!) 117   B12 tried otc and unable to tolerate due to constipation.    Social history:  Relevant past medical, surgical, family and social history reviewed and updated as indicated. Interim medical history since our last visit reviewed.  Allergies and medications reviewed and updated.  DATA REVIEWED: CHART IN EPIC     ROS: Negative unless specifically indicated above in HPI.    Current Outpatient Medications:    clobetasol cream (TEMOVATE) 0.05 %, Apply 1 Application topically 2 (two) times daily., Disp:  30 g, Rfl: 0   tiZANidine (ZANAFLEX) 4 MG tablet, Take 1/2 to 1 tablet po at bedtime prn muscle spasm, Disp: 30 tablet, Rfl: 0   valsartan (DIOVAN) 160 MG tablet, Take 1 tablet (160 mg total) by mouth daily., Disp: 90 tablet, Rfl: 3   atorvastatin (LIPITOR) 10 MG tablet, Take 1 tablet (10 mg total) by mouth daily. (Patient not taking: Reported on 11/14/2022), Disp: 90 tablet, Rfl: 3   cyanocobalamin (VITAMIN B12) 1000 MCG tablet, Take 1,000 mcg by mouth daily. (Patient not taking: Reported on 11/14/2022), Disp: , Rfl:    folic acid (FOLVITE) 800 MCG tablet, Take 800 mcg by mouth daily. (Patient not taking: Reported on 11/14/2022), Disp: , Rfl:       Objective:    BP 126/80 (BP Location: Right Arm, Patient Position: Sitting, Cuff Size: Normal)   Pulse 92   Temp 98 F (36.7 C) (Temporal)   Ht 5\' 7"  (1.702 m)   Wt 201 lb 12.8 oz (91.5 kg)   SpO2 98%   BMI 31.61 kg/m   Wt Readings from Last 3 Encounters:  11/14/22 201 lb 12.8 oz (91.5 kg)  09/04/22 203 lb 8 oz (92.3 kg)  08/05/22 209 lb 12.8 oz (95.2 kg)    Physical Exam Constitutional:      General: She is not in acute distress.  Appearance: Normal appearance. She is normal weight. She is not ill-appearing, toxic-appearing or diaphoretic.  HENT:     Head: Normocephalic.  Cardiovascular:     Rate and Rhythm: Normal rate and regular rhythm.  Pulmonary:     Effort: Pulmonary effort is normal.  Musculoskeletal:        General: Normal range of motion.  Neurological:     General: No focal deficit present.     Mental Status: She is alert and oriented to person, place, and time. Mental status is at baseline.  Psychiatric:        Mood and Affect: Mood normal.        Behavior: Behavior normal.        Thought Content: Thought content normal.        Judgment: Judgment normal.           Assessment & Plan:  Muscle tightness Assessment & Plan: Trial stop atorvastatin 10 mg x one week If no improvement add back Suspect statin as  cause CK ordered and magnesium pending results.  Rx tizanidine, half tablet prn   Orders: -     CK -     Magnesium -     tiZANidine HCl; Take 1/2 to 1 tablet po at bedtime prn muscle spasm  Dispense: 30 tablet; Refill: 0 -     B. burgdorfi antibodies by WB  Essential hypertension Assessment & Plan: Stable.  Restart valsartan to 160 mg once daily as she self d/c 320 mg dose. Discussed with pt non compliance with her own medication management.    Orders: -     Valsartan; Take 1 tablet (160 mg total) by mouth daily.  Dispense: 90 tablet; Refill: 3  On statin therapy -     Lipid panel  Vitamin B12 deficiency -     Cyanocobalamin  Prediabetes Assessment & Plan: Pt advised of the following: Work on a diabetic diet, try to incorporate exercise at least 20-30 a day for 3 days a week or more.    Orders: -     Hemoglobin A1c  Elevated LFTs -     Comprehensive metabolic panel  Tick bite, unspecified site, initial encounter Assessment & Plan: Recurrent Ordering alpha gal and also lyme antibodies  Orders: -     Alpha-Gal Panel -     Clobetasol Propionate; Apply 1 Application topically 2 (two) times daily.  Dispense: 30 g; Refill: 0 -     B. burgdorfi antibodies by WB  Folate deficiency -     B12 and Folate Panel     No follow-ups on file.  Mort Sawyers, MSN, APRN, FNP-C [facility]

## 2022-11-15 ENCOUNTER — Encounter: Payer: Self-pay | Admitting: Family

## 2022-11-15 ENCOUNTER — Other Ambulatory Visit: Payer: Self-pay | Admitting: Family

## 2022-11-15 DIAGNOSIS — R000 Tachycardia, unspecified: Secondary | ICD-10-CM

## 2022-11-15 MED ORDER — PROPRANOLOL HCL 20 MG PO TABS
20.0000 mg | ORAL_TABLET | Freq: Two times a day (BID) | ORAL | 2 refills | Status: DC
Start: 2022-11-15 — End: 2023-02-03

## 2022-11-15 NOTE — Assessment & Plan Note (Signed)
Stable.  Restart valsartan to 160 mg once daily as she self d/c 320 mg dose. Discussed with pt non compliance with her own medication management.

## 2022-11-15 NOTE — Assessment & Plan Note (Signed)
Recurrent Ordering alpha gal and also lyme antibodies

## 2022-11-15 NOTE — Assessment & Plan Note (Signed)
Pt advised of the following: Work on a diabetic diet, try to incorporate exercise at least 20-30 a day for 3 days a week or more.

## 2022-11-15 NOTE — Assessment & Plan Note (Signed)
Trial stop atorvastatin 10 mg x one week If no improvement add back Suspect statin as cause CK ordered and magnesium pending results.  Rx tizanidine, half tablet prn

## 2022-11-21 ENCOUNTER — Encounter: Payer: Self-pay | Admitting: Family

## 2022-11-21 DIAGNOSIS — Z91018 Allergy to other foods: Secondary | ICD-10-CM | POA: Insufficient documentation

## 2022-11-21 LAB — ALPHA-GAL PANEL
Allergen, Mutton, f88: <0.1 kU/L
Allergen, Pork, f26: <0.1 kU/L
Beef: 0.15 kU/L — ABNORMAL HIGH
CLASS: 0
Class: 0
GALACTOSE-ALPHA-1,3-GALACTOSE IGE*: 0.4 kU/L — ABNORMAL HIGH (ref <0.10)

## 2022-11-21 LAB — B. BURGDORFI ANTIBODIES BY WB

## 2022-11-21 LAB — INTERPRETATION:

## 2022-11-22 ENCOUNTER — Emergency Department (HOSPITAL_COMMUNITY)
Admission: EM | Admit: 2022-11-22 | Discharge: 2022-11-23 | Disposition: A | Discharge status: Home / Self Care | Payer: PRIVATE HEALTH INSURANCE | Attending: Emergency Medicine | Admitting: Emergency Medicine

## 2022-11-22 DIAGNOSIS — I10 Essential (primary) hypertension: Secondary | ICD-10-CM | POA: Insufficient documentation

## 2022-11-22 DIAGNOSIS — F17210 Nicotine dependence, cigarettes, uncomplicated: Secondary | ICD-10-CM | POA: Insufficient documentation

## 2022-11-22 DIAGNOSIS — J45909 Unspecified asthma, uncomplicated: Secondary | ICD-10-CM | POA: Insufficient documentation

## 2022-11-22 DIAGNOSIS — F445 Conversion disorder with seizures or convulsions: Secondary | ICD-10-CM | POA: Diagnosis not present

## 2022-11-22 DIAGNOSIS — R451 Restlessness and agitation: Secondary | ICD-10-CM | POA: Diagnosis present

## 2022-11-22 LAB — URINALYSIS, ROUTINE W REFLEX MICROSCOPIC
Bacteria, UA: NONE SEEN
Bilirubin Urine: NEGATIVE
Glucose, UA: NEGATIVE mg/dL
Ketones, ur: NEGATIVE mg/dL
Leukocytes,Ua: NEGATIVE
Nitrite: NEGATIVE
Protein, ur: NEGATIVE mg/dL
Specific Gravity, Urine: 1.003 — ABNORMAL LOW (ref 1.005–1.030)
pH: 6 (ref 5.0–8.0)

## 2022-11-22 LAB — COMPREHENSIVE METABOLIC PANEL
ALT: 16 U/L (ref 0–44)
AST: 22 U/L (ref 15–41)
Albumin: 3.7 g/dL (ref 3.5–5.0)
Alkaline Phosphatase: 111 U/L (ref 38–126)
Anion gap: 13 (ref 5–15)
BUN: 5 mg/dL — ABNORMAL LOW (ref 6–20)
CO2: 23 mmol/L (ref 22–32)
Calcium: 9 mg/dL (ref 8.9–10.3)
Chloride: 106 mmol/L (ref 98–111)
Creatinine, Ser: 0.83 mg/dL (ref 0.44–1.00)
GFR, Estimated: >60 mL/min (ref >60)
Glucose, Bld: 119 mg/dL — ABNORMAL HIGH (ref 70–99)
Potassium: 3.9 mmol/L (ref 3.5–5.1)
Sodium: 142 mmol/L (ref 135–145)
Total Bilirubin: 0.7 mg/dL (ref 0.3–1.2)
Total Protein: 8 g/dL (ref 6.5–8.1)

## 2022-11-22 LAB — RAPID URINE DRUG SCREEN, HOSP PERFORMED
Amphetamines: NOT DETECTED
Barbiturates: NOT DETECTED
Benzodiazepines: POSITIVE — AB
Cocaine: NOT DETECTED
Opiates: NOT DETECTED
Tetrahydrocannabinol: POSITIVE — AB

## 2022-11-22 LAB — ETHANOL: Alcohol, Ethyl (B): 285 mg/dL — ABNORMAL HIGH (ref <10)

## 2022-11-22 LAB — SALICYLATE LEVEL: Salicylate Lvl: <7 mg/dL — ABNORMAL LOW (ref 7.0–30.0)

## 2022-11-22 LAB — CBC WITH DIFFERENTIAL/PLATELET

## 2022-11-22 LAB — ACETAMINOPHEN LEVEL: Acetaminophen (Tylenol), Serum: <10 ug/mL — ABNORMAL LOW (ref 10–30)

## 2022-11-22 MED ORDER — STERILE WATER FOR INJECTION IJ SOLN
INTRAMUSCULAR | Status: AC
  Filled 2022-11-22: qty 10

## 2022-11-22 MED ORDER — DIAZEPAM 5 MG/ML IJ SOLN
5.0000 mg | Freq: Once | INTRAMUSCULAR | Status: AC
  Administered 2022-11-22: 5 mg via INTRAVENOUS
  Filled 2022-11-22: qty 2

## 2022-11-22 MED ORDER — LORAZEPAM 2 MG/ML IJ SOLN: 2.0000 mg | Freq: Once | INTRAMUSCULAR | Status: DC

## 2022-11-22 MED ORDER — ZIPRASIDONE MESYLATE 20 MG IM SOLR
20.0000 mg | Freq: Once | INTRAMUSCULAR | Status: AC
  Administered 2022-11-22: 20 mg via INTRAMUSCULAR
  Filled 2022-11-22: qty 20

## 2022-11-22 MED ORDER — SODIUM CHLORIDE 0.9 % IV BOLUS: 1000.0000 mL | Freq: Once | INTRAVENOUS | Status: DC

## 2022-11-22 NOTE — ED Provider Notes (Signed)
[facility] Emergency Department Provider Note MRN:  [id number]  Arrival date & time: 11/22/22     Chief Complaint   Agitation History of Present Illness   Tammy Decker is a 52 y.o. year-old female with a history of hypertension presenting to the ED with chief complaint of agitation.  Report of agitation, pseudoseizures, combative and severely disruptive upon arrival here in the emergency department requiring sedation.  Review of Systems  I was unable to obtain a full/accurate HPI, PMH, or ROS due to the patient's altered mental status.  Patient's Health History    Past Medical History:  Diagnosis Date   Asthma    Complication of anesthesia    OXYGEN SOMETIMES DROPS LOW   GERD (gastroesophageal reflux disease)    Hypertension    Pyelonephritis    Tachycardia 2020    Past Surgical History:  Procedure Laterality Date   BACK SURGERY     CHOLECYSTECTOMY     ESOPHAGOGASTRODUODENOSCOPY (EGD) WITH PROPOFOL N/A 06/19/2021   Procedure: ESOPHAGOGASTRODUODENOSCOPY (EGD) WITH PROPOFOL;  Surgeon: Toney Reil, MD;  Location: [facility] ENDOSCOPY;  Service: Gastroenterology;  Laterality: N/A;   NECK SURGERY     uterine ablation     XI ROBOTIC ASSISTED VENTRAL HERNIA N/A 05/09/2020   Procedure: XI ROBOTIC ASSISTED VENTRAL HERNIA, incisional hernia;  Surgeon: Leafy Ro, MD;  Location: [facility] ORS;  Service: General;  Laterality: N/A;    Family History  Problem Relation Age of Onset   Diabetes Mother    Diabetes Father    Heart attack Father    Ovarian cancer Maternal Grandmother        older dx maybe early 82s   Colon cancer Maternal Grandfather        in his 10's   Early death Paternal Grandfather    Heart attack Paternal Grandfather     Social History   Socioeconomic History   Marital status: Married    Spouse name: Not on file   Number of children: Not on file   Years of education: Not on file   Highest education level: Not on file   Occupational History   Not on file  Tobacco Use   Smoking status: Every Day    Current packs/day: 0.15    Types: Cigarettes    Passive exposure: Past   Smokeless tobacco: Never   Tobacco comments:    down to 3 cigs per day  Vaping Use   Vaping status: Never Used  Substance and Sexual Activity   Alcohol use: Yes    Comment: 1/5 whiskey nightly   Drug use: Never   Sexual activity: Yes    Partners: Male    Birth control/protection: None, Post-menopausal  Other Topics Concern   Not on file  Social History Narrative   Not on file   Social Determinants of Health   Financial Resource Strain: Not on file  Food Insecurity: No Food Insecurity (04/20/2022)   Hunger Vital Sign    Worried About Running Out of Food in the Last Year: Never true    Ran Out of Food in the Last Year: Never true  Transportation Needs: No Transportation Needs (04/20/2022)   PRAPARE - Administrator, Civil Service (Medical): No    Lack of Transportation (Non-Medical): No  Physical Activity: Not on file  Stress: Not on file  Social Connections: Not on file  Intimate Partner Violence: Not At Risk (04/20/2022)   Humiliation, Afraid, Rape, and Kick questionnaire  Fear of Current or Ex-Partner: No    Emotionally Abused: No    Physically Abused: No    Sexually Abused: No     Physical Exam   Vitals:   11/22/22 2321 11/22/22 2322  BP: 98/68   Pulse: (!) 117   Resp: (!) 23   Temp:  97.7 F (36.5 C)  SpO2: 94%     CONSTITUTIONAL: Chronically ill-appearing NEURO/PSYCH: Repetitive freezing, combative, fighting staff, moving all extremities EYES:  eyes equal and reactive ENT/NECK:  no LAD, no JVD CARDIO: Tachycardic rate, well-perfused, normal S1 and S2 PULM:  CTAB no wheezing or rhonchi GI/GU:  non-distended, non-tender MSK/SPINE:  No gross deformities, no edema SKIN:  no rash, atraumatic   *Additional and/or pertinent findings included in MDM below  Diagnostic and Interventional  Summary    EKG Interpretation Date/Time:  Friday November 22 2022 22:57:31 EDT Ventricular Rate:  152 PR Interval:  129 QRS Duration:  91 QT Interval:  279 QTC Calculation: 444 R Axis:   57  Text Interpretation: Sinus tachycardia Ventricular premature complex Aberrant conduction of SV complex(es) Borderline repolarization abnormality Confirmed by Kennis Carina [phone]) on 11/22/2022 11:51:43 PM       Labs Reviewed  CBC WITH DIFFERENTIAL/PLATELET - Abnormal; Notable for the following components:      Result Value   WBC 13.9 (*)    RBC 5.16 (*)    Hemoglobin 15.5 (*)    HCT 47.8 (*)    All other components within normal limits  COMPREHENSIVE METABOLIC PANEL - Abnormal; Notable for the following components:   Glucose, Bld 119 (*)    BUN 5 (*)    All other components within normal limits  URINALYSIS, ROUTINE W REFLEX MICROSCOPIC - Abnormal; Notable for the following components:   Color, Urine STRAW (*)    Specific Gravity, Urine 1.003 (*)    Hgb urine dipstick SMALL (*)    All other components within normal limits  ETHANOL - Abnormal; Notable for the following components:   Alcohol, Ethyl (B) 285 (*)    All other components within normal limits  ACETAMINOPHEN LEVEL - Abnormal; Notable for the following components:   Acetaminophen (Tylenol), Serum <10 (*)    All other components within normal limits  SALICYLATE LEVEL - Abnormal; Notable for the following components:   Salicylate Lvl <7.0 (*)    All other components within normal limits  RAPID URINE DRUG SCREEN, HOSP PERFORMED    No orders to display    Medications  sterile water (preservative free) injection (has no administration in time range)  LORazepam (ATIVAN) injection 2 mg (has no administration in time range)  sodium chloride 0.9 % bolus 1,000 mL (has no administration in time range)  ziprasidone (GEODON) injection 20 mg (20 mg Intramuscular Given 11/22/22 2251)  diazepam (VALIUM) injection 5 mg (5 mg Intravenous  Given 11/22/22 2257)     Procedures  /  Critical Care .Critical Care  Performed by: Sabas Sous, MD Authorized by: Sabas Sous, MD   Critical care provider statement:    Critical care time (minutes):  35   Critical care was necessary to treat or prevent imminent or life-threatening deterioration of the following conditions: Agitation.   Critical care was time spent personally by me on the following activities:  Development of treatment plan with patient or surrogate, discussions with consultants, evaluation of patient's response to treatment, examination of patient, ordering and review of laboratory studies, ordering and review of radiographic studies, ordering and performing  treatments and interventions, pulse oximetry, re-evaluation of patient's condition and review of old charts   ED Course and Medical Decision Making  Initial Impression and Ddx Agitated delirium versus unstable psychiatric condition requiring sedation.  Repeatedly stating concerns that she is allergic to red meat but ate some this evening and is now having a severe allergic reaction.  No evidence of a severe allergic reaction.  Also concerned that she has Lyme's disease, concerned that she needs an epi shot.  Attempting to kick and punch staff, required Geodon as well as benzodiazepines for sedation.  4 point restraints, intubations still a possibility to protect the patient at this point.  Patient's significant other made it to bedside and attempted to pull staff away from her, he was escorted out.  Past medical/surgical history that increases complexity of ED encounter: Hypertension  Interpretation of Diagnostics I personally reviewed the EKG and my interpretation is as follows: Sinus tachycardia  Labs pending  Patient Reassessment and Ultimate Disposition/Management     11:52 PM update: Patient seems much more calm, she is requesting to leave.  She is now fully awake alert and oriented, she has capacity to  make decisions.  She denies any suicidality, no AVH.  And so I do not have a psychiatric reason to hold her against her will.  Restraints were removed without issue, no longer fighting or kicking staff.  She blames her behavior on her seizure related to her meat allergy.  This seems like a profound misunderstanding of her health or a fixed delusion of some kind but not something that would keep her here against her will.  I explained to her that we do not have her laboratory evaluation completed and that if she were to leave right now would be AGAINST MEDICAL ADVICE, we discussed the risks of leaving including death, still she wishes to go.  She is encouraged to return anytime for continued care.  Patient management required discussion with the following services or consulting groups:  None  Complexity of Problems Addressed Acute illness or injury that poses threat of life of bodily function  Additional Data Reviewed and Analyzed Further history obtained from: None  Additional Factors Impacting ED Encounter Risk Consideration of hospitalization  Elmer Sow. Pilar Plate, MD [facility] Emergency Medicine [facility] [email or web address]  Final Clinical Impressions(s) / ED Diagnoses     ICD-10-CM   1. 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  We discussed the risks of leaving without a complete evaluation, including missing an important diagnosis, worsening of condition, death at home.  Still you insisted on leaving.  You can return at any time for continued care.       Sabas Sous, MD 11/22/22 [phone]

## 2022-11-22 NOTE — ED Notes (Signed)
Patients friend called to get an update, Misty Stanley [phone]

## 2022-11-22 NOTE — ED Notes (Signed)
This RN witnessed patient answer all orientation questions correctly when orientation was assessed by the provider.

## 2022-11-22 NOTE — ED Provider Triage Note (Signed)
Emergency Medicine Provider Triage Evaluation Note  Tammy Decker , a 52 y.o. female  was evaluated in triage.  Pt complains of drinking alcohol this evening, had some type of seizure activity, complaining of "I have Lyme's disease", I have an allergy to red meat, I ate red meat, "you know why I am here", "fuck you"  Review of Systems  Positive: Seizure-like activity Negative:   Physical Exam  There were no vitals taken for this visit. Gen:   Awake, screaming, fighting, punching, kicking, cursing, spitting Resp:  Normal effort no distress MSK:   Moves extremities without difficulty no obvious trauma to the arms or the legs Other:  Mild tachycardia  Medical Decision Making  Medically screening exam initiated at 10:50 PM.  Appropriate orders placed.  Tammy Decker was informed that the remainder of the evaluation will be completed by another provider, this initial triage assessment does not replace that evaluation, and the importance of remaining in the ED until their evaluation is complete.  This patient is agitated and violent, she was placed in restraints by paramedics prehospital, and trying to talk to the patient she becomes extremely agitated and starts spitting at me punching and kicking.  She will be sedated with Geodon.  Evidently she had "pseudoseizure like activity" according to the paramedics.  She was never postictal had no urinary incontinence and no tongue biting.   Eber Hong, MD 11/23/22 0730

## 2022-11-22 NOTE — Discharge Instructions (Addendum)
You are leaving the hospital AGAINST MEDICAL ADVICE.  We discussed the risks of leaving without a complete evaluation, including missing an important diagnosis, worsening of condition, death at home.  Still you insisted on leaving.  You can return at any time for continued care.

## 2022-11-23 ENCOUNTER — Other Ambulatory Visit: Payer: Self-pay

## 2022-11-23 ENCOUNTER — Encounter (HOSPITAL_COMMUNITY): Payer: Self-pay

## 2022-11-23 LAB — CBC WITH DIFFERENTIAL/PLATELET
Basophils Relative: 0 10*3/uL (ref 0.0–0.1)
Eosinophils Absolute: 0 10*3/uL (ref 0.0–0.5)
Eosinophils Relative: 0 10*3/uL (ref 0.0–0.5)
HCT: 47.8 % — ABNORMAL HIGH (ref 36.0–46.0)
Hemoglobin: 15.5 g/dL — ABNORMAL HIGH (ref 12.0–15.0)
Lymphocytes Relative: 35 %
Lymphs Abs: 4.9 10*3/uL — ABNORMAL HIGH (ref 0.7–4.0)
MCH: 30 pg (ref 26.0–34.0)
MCHC: 32.4 g/dL (ref 30.0–36.0)
MCV: 92.6 fL (ref 80.0–100.0)
Monocytes Absolute: 0 10*3/uL (ref 0.1–1.0)
Monocytes Relative: 0.7 10*3/uL (ref 0.1–1.0)
Monocytes Relative: 5 10*3/uL (ref 0.7–4.0)
Neutro Abs: 8.3 10*3/uL — ABNORMAL HIGH (ref 1.7–7.7)
Neutrophils Relative %: 60 %
Platelets: 381 10*3/uL (ref 150–400)
RBC: 5.16 MIL/uL — ABNORMAL HIGH (ref 3.87–5.11)
RDW: 14.6 % (ref 11.5–15.5)
WBC Morphology: 0 10*3/uL (ref 0.00–0.07)
WBC: 13.9 10*3/uL — ABNORMAL HIGH (ref 4.0–10.5)
nRBC: 0 % (ref 0.0–0.2)
nRBC: 0 10*3/uL

## 2022-11-23 NOTE — ED Notes (Signed)
Patients husband came back to pick patient up. Was still alert and oriented.

## 2022-11-23 NOTE — ED Notes (Signed)
Witnessed patient answer all orientation questions, released restraints, called for patients ride.

## 2022-11-24 ENCOUNTER — Ambulatory Visit (HOSPITAL_COMMUNITY)
Admission: RE | Admit: 2022-11-24 | Discharge: 2022-11-24 | Disposition: A | Discharge status: Home / Self Care | Payer: No Typology Code available for payment source | Source: Ambulatory Visit | Attending: Emergency Medicine | Admitting: Emergency Medicine

## 2022-11-24 ENCOUNTER — Encounter (HOSPITAL_COMMUNITY): Payer: Self-pay

## 2022-11-24 VITALS — BP 148/103 | HR 81 | Temp 98.1°F | Resp 16

## 2022-11-24 DIAGNOSIS — S91301A Unspecified open wound, right foot, initial encounter: Secondary | ICD-10-CM

## 2022-11-24 DIAGNOSIS — R52 Pain, unspecified: Secondary | ICD-10-CM | POA: Diagnosis not present

## 2022-11-24 MED ORDER — BACLOFEN 5 MG PO TABS: 5.0000 mg | ORAL_TABLET | Freq: Two times a day (BID) | ORAL | 0 refills | Status: DC | PRN

## 2022-11-24 MED ORDER — DOXYCYCLINE HYCLATE 100 MG PO CAPS: 100.0000 mg | ORAL_CAPSULE | Freq: Two times a day (BID) | ORAL | 0 refills | Status: AC

## 2022-11-24 MED ORDER — MUPIROCIN 2 % EX OINT: 1.0000 | TOPICAL_OINTMENT | Freq: Two times a day (BID) | CUTANEOUS | 0 refills | Status: DC

## 2022-11-24 NOTE — ED Triage Notes (Signed)
Patient reports that she had a seizure 2 days ago and is concerned about aches and pains "all over" patient states she has anterior neck pain, and an open wound to the right heel and left great toe.  Patient states she has been using OTC triple antibiotic.

## 2022-11-24 NOTE — Discharge Instructions (Addendum)
For the heal wound: doxycyline twice daily for 5 days. Take with food to avoid upset stomach. You can also apply the bactroban ointment twice daily Keep clean and dry, cover if needed  For body aches: continue tylenol, and ibuprofen if you have eaten. You can take the baclofen (muscle relaxer) twice daily. If this makes you drowsy, please take only once before bedtime  Contact your primary care provider tomorrow to make appointment for follow up Please return with any concerns!
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
## 2022-11-25 ENCOUNTER — Telehealth: Payer: Self-pay

## 2022-11-25 DIAGNOSIS — S91301A Unspecified open wound, right foot, initial encounter: Secondary | ICD-10-CM

## 2022-11-25 NOTE — Telephone Encounter (Signed)
Spoke with pt. She has an appointment scheduled with Tabitha on 12/03/2022 at 0800. Pt will keep [place] updated on if she feels like she needs to be seen sooner. Will route message to Tabitha to make her aware.

## 2022-11-25 NOTE — Telephone Encounter (Signed)
Please reach out to pt to make urgent f/u with me and we will discuss at visit. Please assess appropriately for stability but it does appear she was discharged, so she should be 'stable'.

## 2022-11-25 NOTE — Telephone Encounter (Signed)
Reviewed patient chart she was in the ED on 9/20 and 9/22. We do not have anyone listed in Cart on DPRl do you want me to reach out to patient?

## 2022-11-26 NOTE — Telephone Encounter (Signed)
Thank you :)
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
## 2022-11-28 MED ORDER — CEPHALEXIN 500 MG PO CAPS
500.0000 mg | ORAL_CAPSULE | Freq: Four times a day (QID) | ORAL | 0 refills | Status: AC
Start: 2022-11-28 — End: 2022-12-08

## 2022-11-28 NOTE — Telephone Encounter (Signed)
Thank you for the assessment. I have reached out to pt via mychart to see if she can send a picture of the wound.

## 2022-12-03 ENCOUNTER — Ambulatory Visit: Payer: No Typology Code available for payment source | Admitting: Family

## 2022-12-03 ENCOUNTER — Ambulatory Visit (INDEPENDENT_AMBULATORY_CARE_PROVIDER_SITE_OTHER)
Admission: RE | Admit: 2022-12-03 | Discharge: 2022-12-03 | Disposition: A | Discharge status: Home / Self Care | Payer: No Typology Code available for payment source | Source: Ambulatory Visit | Attending: Family

## 2022-12-03 ENCOUNTER — Encounter: Payer: Self-pay | Admitting: Family

## 2022-12-03 VITALS — BP 126/74 | HR 97 | Temp 98.0°F | Ht 66.0 in | Wt 198.8 lb

## 2022-12-03 DIAGNOSIS — S99922A Unspecified injury of left foot, initial encounter: Secondary | ICD-10-CM

## 2022-12-03 DIAGNOSIS — B379 Candidiasis, unspecified: Secondary | ICD-10-CM

## 2022-12-03 DIAGNOSIS — D72829 Elevated white blood cell count, unspecified: Secondary | ICD-10-CM | POA: Diagnosis not present

## 2022-12-03 DIAGNOSIS — Z87898 Personal history of other specified conditions: Secondary | ICD-10-CM | POA: Diagnosis not present

## 2022-12-03 DIAGNOSIS — S99921A Unspecified injury of right foot, initial encounter: Secondary | ICD-10-CM

## 2022-12-03 DIAGNOSIS — R569 Unspecified convulsions: Secondary | ICD-10-CM | POA: Diagnosis not present

## 2022-12-03 DIAGNOSIS — E538 Deficiency of other specified B group vitamins: Secondary | ICD-10-CM

## 2022-12-03 DIAGNOSIS — R404 Transient alteration of awareness: Secondary | ICD-10-CM

## 2022-12-03 DIAGNOSIS — F1011 Alcohol abuse, in remission: Secondary | ICD-10-CM

## 2022-12-03 LAB — CBC WITH DIFFERENTIAL/PLATELET
Basophils Absolute: 0.1 10*3/uL (ref 0.0–0.1)
Basophils Relative: 0.9 % (ref 0.0–3.0)
Eosinophils Absolute: 0.1 10*3/uL (ref 0.0–0.7)
Eosinophils Relative: 0.7 % (ref 0.0–5.0)
HCT: 47 % — ABNORMAL HIGH (ref 36.0–46.0)
Hemoglobin: 15.2 g/dL — ABNORMAL HIGH (ref 12.0–15.0)
Lymphocytes Relative: 23.4 % (ref 12.0–46.0)
Lymphs Abs: 2.3 10*3/uL (ref 0.7–4.0)
MCHC: 32.2 g/dL (ref 30.0–36.0)
MCV: 95 fL (ref 78.0–100.0)
Monocytes Absolute: 0.6 10*3/uL (ref 0.1–1.0)
Monocytes Relative: 5.8 % (ref 3.0–12.0)
Neutro Abs: 6.9 10*3/uL (ref 1.4–7.7)
Neutrophils Relative %: 69.2 % (ref 43.0–77.0)
Platelets: 324 10*3/uL (ref 150.0–400.0)
RBC: 4.95 Mil/uL (ref 3.87–5.11)
RDW: 15.1 % (ref 11.5–15.5)
WBC: 10 10*3/uL (ref 4.0–10.5)

## 2022-12-03 MED ORDER — FLUCONAZOLE 150 MG PO TABS: ORAL_TABLET | ORAL | 0 refills | Status: DC
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
## 2022-12-04 ENCOUNTER — Encounter: Payer: Self-pay | Admitting: Family

## 2022-12-04 DIAGNOSIS — S99921A Unspecified injury of right foot, initial encounter: Secondary | ICD-10-CM | POA: Insufficient documentation

## 2022-12-04 DIAGNOSIS — S99922A Unspecified injury of left foot, initial encounter: Secondary | ICD-10-CM | POA: Insufficient documentation

## 2022-12-04 DIAGNOSIS — F1011 Alcohol abuse, in remission: Secondary | ICD-10-CM | POA: Insufficient documentation

## 2022-12-04 DIAGNOSIS — D72829 Elevated white blood cell count, unspecified: Secondary | ICD-10-CM | POA: Insufficient documentation

## 2022-12-04 MED ORDER — DOXYCYCLINE HYCLATE 100 MG PO TABS
100.0000 mg | ORAL_TABLET | Freq: Two times a day (BID) | ORAL | 0 refills | Status: AC
Start: 2022-12-04 — End: 2022-12-09

## 2022-12-04 NOTE — Assessment & Plan Note (Signed)
With loc per pt report.  Witnessed by husband.  EKG reviewed via ER report. Does have tachycardia, maintain f/u with cardiology as scheduled and possible workup with cards for syncopal episode

## 2022-12-04 NOTE — Assessment & Plan Note (Signed)
Ordered b12 pending results  

## 2022-12-04 NOTE — Assessment & Plan Note (Signed)
CT head ordered pending results  Ddx ischemia, neoplasm, although low suspicion  Amb ref neurology for possible seizures per pt historical report, per ER visit possible psychogenic seizure which higher suspicion for given etoh level at most recent episode. Eval/treat  Assessing b12 to r/o b12 def

## 2022-12-04 NOTE — Assessment & Plan Note (Signed)
Continue mupirocin ointment twice daily  Pt advised to:  Please monitor site for worsening signs/symptoms of infection to include: increasing redness, increasing tenderness, increase in size, and or pustulant drainage from site. If this is to occur please let me know immediately.

## 2022-12-04 NOTE — Assessment & Plan Note (Addendum)
R/o acute findings such as fracture  Ddx plantar fasciaitis , fracture, osteomyelitis Wear supportive shoes, heel supports, keep wound covered.  Wound culture ordered pending results

## 2022-12-04 NOTE — Addendum Note (Signed)
Addended by: Jaynee Eagles C on: 12/04/2022 12:35 PM   Modules accepted: Orders

## 2022-12-06 LAB — WOUND CULTURE
MICRO NUMBER:: 15536416
SPECIMEN QUALITY:: ADEQUATE

## 2022-12-10 ENCOUNTER — Encounter: Payer: Self-pay | Admitting: Family

## 2023-01-09 ENCOUNTER — Encounter: Payer: Self-pay | Admitting: Family

## 2023-01-09 ENCOUNTER — Encounter: Payer: Self-pay | Admitting: *Deleted

## 2023-01-09 ENCOUNTER — Ambulatory Visit (INDEPENDENT_AMBULATORY_CARE_PROVIDER_SITE_OTHER): Payer: Self-pay | Admitting: Family

## 2023-01-09 VITALS — BP 126/82 | HR 74 | Temp 98.2°F | Ht 66.0 in | Wt 201.8 lb

## 2023-01-09 DIAGNOSIS — R160 Hepatomegaly, not elsewhere classified: Secondary | ICD-10-CM

## 2023-01-09 DIAGNOSIS — D520 Dietary folate deficiency anemia: Secondary | ICD-10-CM

## 2023-01-09 DIAGNOSIS — E538 Deficiency of other specified B group vitamins: Secondary | ICD-10-CM

## 2023-01-09 DIAGNOSIS — G4719 Other hypersomnia: Secondary | ICD-10-CM

## 2023-01-09 DIAGNOSIS — R7989 Other specified abnormal findings of blood chemistry: Secondary | ICD-10-CM | POA: Insufficient documentation

## 2023-01-09 MED ORDER — CYANOCOBALAMIN 1000 MCG/ML IJ SOLN
1000.0000 ug | Freq: Once | INTRAMUSCULAR | Status: AC
  Administered 2023-01-09: 1000 ug via INTRAMUSCULAR

## 2023-01-09 MED ORDER — SYRINGE/NEEDLE (DISP) 25G X 5/8" 3 ML MISC: 0 refills | Status: DC

## 2023-01-09 MED ORDER — FOLIC ACID 1 MG PO TABS
1.0000 mg | ORAL_TABLET | Freq: Every day | ORAL | 1 refills | Status: DC
Start: 2023-01-09 — End: 2023-08-29

## 2023-01-09 MED ORDER — CYANOCOBALAMIN 1000 MCG/ML IJ SOLN: INTRAMUSCULAR | 3 refills | Status: AC

## 2023-01-09 NOTE — Progress Notes (Signed)
Established Patient Office Visit  Subjective:      CC:  Chief Complaint  Patient presents with   Medical Management of Chronic Issues    Wants to discuss Vitamin B12 supplementation, OTC causes constipation.    HPI: Tammy Decker is a 52 y.o. female presenting on 01/09/2023 for Medical Management of Chronic Issues (Wants to discuss Vitamin B12 supplementation, OTC causes constipation.) . Vitamin b12 def: trying otc but keeps causing her constipation, she did get b12 last month and felt better for about one week but the fatigue is back and feeling very tired.   Excessive daytime sleepiness, sleeps well as far as she knows she doesn't snore.       Social history:  Relevant past medical, surgical, family and social history reviewed and updated as indicated. Interim medical history since our last visit reviewed.  Allergies and medications reviewed and updated.  DATA REVIEWED: CHART IN EPIC     ROS: Negative unless specifically indicated above in HPI.    Current Outpatient Medications:    cyanocobalamin (VITAMIN B12) 1000 MCG/ML injection, Inject 1 ml 1000 mcg total into muscle once a week for three weeks then once a month for three months, Disp: 4 mL, Rfl: 3   folic acid (FOLVITE) 1 MG tablet, Take 1 tablet (1 mg total) by mouth daily. Take one po every day, Disp: 90 tablet, Rfl: 1   propranolol (INDERAL) 20 MG tablet, Take 1 tablet (20 mg total) by mouth in the morning and at bedtime., Disp: 60 tablet, Rfl: 2   SYRINGE-NEEDLE, DISP, 3 ML 25G X 5/8" 3 ML MISC, Use to inject b12 IM once weekly for four weeks then once monthly for three months, Disp: 7 each, Rfl: 0      Objective:    BP 126/82 (BP Location: Left Arm, Patient Position: Sitting, Cuff Size: Normal)   Pulse 74   Temp 98.2 F (36.8 C) (Temporal)   Ht 5\' 6"  (1.676 m)   Wt 201 lb 12.8 oz (91.5 kg)   SpO2 97%   BMI 32.57 kg/m   Wt Readings from Last 3 Encounters:  01/09/23 201 lb 12.8 oz (91.5  kg)  12/03/22 198 lb 12.8 oz (90.2 kg)  11/23/22 204 lb (92.5 kg)    Physical Exam Vitals reviewed.  Constitutional:      General: She is not in acute distress.    Appearance: Normal appearance. She is normal weight. She is not ill-appearing, toxic-appearing or diaphoretic.  HENT:     Head: Normocephalic.  Cardiovascular:     Rate and Rhythm: Normal rate.  Pulmonary:     Effort: Pulmonary effort is normal.  Musculoskeletal:        General: Normal range of motion.  Neurological:     General: No focal deficit present.     Mental Status: She is alert and oriented to person, place, and time. Mental status is at baseline.  Psychiatric:        Mood and Affect: Mood normal.        Behavior: Behavior normal.        Thought Content: Thought content normal.        Judgment: Judgment normal.           Assessment & Plan:  Dietary folate deficiency anemia -     Folic Acid; Take 1 tablet (1 mg total) by mouth daily. Take one po every day  Dispense: 90 tablet; Refill: 1 -     B12 and  Folate Panel; Future  Excessive daytime sleepiness Assessment & Plan: Ordering labwork to r/o reasons for fatigue, although strongly suspect b12 def  Folate def, also advised pt to take daily folic acid  Orders: -     Ambulatory referral to Sleep Studies -     TSH; Future  Abnormal thyroid blood test  Low serum vitamin B12 Assessment & Plan: Will do b12 injection again today  Will do 4 weeks weekly followed by monthly x 3 more pt can not tolerate otc b12 Ordering b12 today pending results  Orders: -     B12 and Folate Panel; Future -     Methylmalonic Acid and Homocysteine, Serum; Future -     Cyanocobalamin; Inject 1 ml 1000 mcg total into muscle once a week for three weeks then once a month for three months  Dispense: 4 mL; Refill: 3 -     Syringe/Needle (Disp); Use to inject b12 IM once weekly for four weeks then once monthly for three months  Dispense: 7 each; Refill: 0 -      Cyanocobalamin  Hepatomegaly -     Hepatic function panel; Future     Return in about 6 months (around 07/09/2023) for follow up b12 def .  Mort Sawyers, MSN, APRN, FNP-C [facility]

## 2023-01-12 NOTE — Assessment & Plan Note (Addendum)
Will do b12 injection again today  Will do 4 weeks weekly followed by monthly x 3 more pt can not tolerate otc b12 Ordering b12 today pending results

## 2023-01-12 NOTE — Assessment & Plan Note (Signed)
Ordering labwork to r/o reasons for fatigue, although strongly suspect b12 def  Folate def, also advised pt to take daily folic acid

## 2023-01-25 ENCOUNTER — Telehealth: Payer: No Typology Code available for payment source | Admitting: Physician Assistant

## 2023-01-25 ENCOUNTER — Telehealth: Payer: No Typology Code available for payment source | Admitting: Family Medicine

## 2023-01-25 ENCOUNTER — Encounter: Payer: Self-pay | Admitting: Physician Assistant

## 2023-01-25 DIAGNOSIS — R112 Nausea with vomiting, unspecified: Secondary | ICD-10-CM | POA: Diagnosis not present

## 2023-01-25 DIAGNOSIS — R519 Headache, unspecified: Secondary | ICD-10-CM

## 2023-01-25 DIAGNOSIS — R42 Dizziness and giddiness: Secondary | ICD-10-CM

## 2023-01-25 MED ORDER — ONDANSETRON 4 MG PO TBDP
4.0000 mg | ORAL_TABLET | Freq: Three times a day (TID) | ORAL | 0 refills | Status: DC | PRN
Start: 2023-01-25 — End: 2023-09-24

## 2023-01-25 NOTE — Progress Notes (Signed)
Virtual Visit Consent   Tammy Decker, you are scheduled for a virtual visit with a [facility] provider today. Just as with appointments in the office, your consent must be obtained to participate. Your consent will be active for this visit and any virtual visit you may have with one of our providers in the next 365 days. If you have a MyChart account, a copy of this consent can be sent to you electronically.  As this is a virtual visit, video technology does not allow for your provider to perform a traditional examination. This may limit your provider's ability to fully assess your condition. If your provider identifies any concerns that need to be evaluated in person or the need to arrange testing (such as labs, EKG, etc.), we will make arrangements to do so. Although advances in technology are sophisticated, we cannot ensure that it will always work on either your end or our end. If the connection with a video visit is poor, the visit may have to be switched to a telephone visit. With either a video or telephone visit, we are not always able to ensure that we have a secure connection.  By engaging in this virtual visit, you consent to the provision of healthcare and authorize for your insurance to be billed (if applicable) for the services provided during this visit. Depending on your insurance coverage, you may receive a charge related to this service.  I need to obtain your verbal consent now. Are you willing to proceed with your visit today? [name] has provided verbal consent on 01/25/2023 for a virtual visit (video or telephone). Tammy Decker, [place]  Date: 01/25/2023 6:24 PM  Virtual Visit via Video Note   I, Tammy Decker, connected with  Marche Granier  ([id number], [birth year]) on 01/25/23 at  6:15 PM EST by a video-enabled telemedicine application and verified that I am speaking with the correct person using two identifiers.  Location: Patient: Virtual Visit  Location Patient: Home Provider: Virtual Visit Location Provider: Home Office   I discussed the limitations of evaluation and management by telemedicine and the availability of in person appointments. The patient expressed understanding and agreed to proceed.    History of Present Illness: Tammy Decker is a 52 y.o. who identifies as a female who was assigned female at birth, and is being seen today for headache.  HPI: Headache  This is a new problem. The current episode started yesterday. The problem occurs constantly. The problem has been waxing and waning. The quality of the pain is described as pulsating. Associated symptoms include dizziness and nausea. Nothing aggravates the symptoms. She has tried acetaminophen for the symptoms. The treatment provided no relief. There is no history of migraine headaches or migraines in the family.    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   Low serum vitamin B12 07/31/2022   Prediabetes 07/31/2022   Seasonal allergic rhinitis due to pollen 07/25/2022   Hepatic steatosis 04/23/2022   Hepatomegaly 04/23/2022   Duodenitis 04/23/2022   Cigarette smoker motivated to quit 04/23/2022   Elevated TSH 04/23/2022  Renal cyst, acquired, left 01/22/2022   H/O pyelonephritis 01/22/2022   Proteinuria 01/22/2022   Alcoholic pancreatitis 01/08/2022   Asthma 01/08/2022   Obesity with body mass index (BMI) of 30.0 to 39.9 01/08/2022   Anxiety and depression 12/07/2021   Menopause 12/07/2021   Hyperglycemia 12/07/2021   H/o Lyme  disease 12/07/2021   Chronic GERD    MDD (major depressive disorder), recurrent episode, moderate (HCC) 09/13/2020   Essential hypertension 09/11/2020   Degeneration of lumbar intervertebral disc 09/13/2013   Chronic back pain 09/01/2012    Allergies:  Allergies  Allergen Reactions   Ciprofloxacin Anaphylaxis and Hives   Kiwi Extract Anaphylaxis   Metoprolol    Other     Ticks, beef, lamb, pork   Sulfa Antibiotics Hives   Medications:  Current Outpatient Medications:    cyanocobalamin (VITAMIN B12) 1000 MCG/ML injection, Inject 1 ml 1000 mcg total into muscle once a week for three weeks then once a month for three months, Disp: 4 mL, Rfl: 3   folic acid (FOLVITE) 1 MG tablet, Take 1 tablet (1 mg total) by mouth daily. Take one po every day, Disp: 90 tablet, Rfl: 1   ondansetron (ZOFRAN-ODT) 4 MG disintegrating tablet, Take 1 tablet (4 mg total) by mouth every 8 (eight) hours as needed for nausea or vomiting., Disp: 20 tablet, Rfl: 0   propranolol (INDERAL) 20 MG tablet, Take 1 tablet (20 mg total) by mouth in the morning and at bedtime., Disp: 60 tablet, Rfl: 2   SYRINGE-NEEDLE, DISP, 3 ML 25G X 5/8" 3 ML MISC, Use to inject b12 IM once weekly for four weeks then once monthly for three months, Disp: 7 each, Rfl: 0  Observations/Objective: Patient is well-developed, well-nourished in no acute distress.  Resting comfortably  at home.  Head is normocephalic, atraumatic.  No labored breathing.  Speech is clear and coherent with logical content.  Patient is alert and oriented at baseline.    Assessment and Plan: 1. Acute nonintractable headache, unspecified headache type  2. Dizziness  Patient presenting with worsening headache x 2 days not relieved at home with tylenol and ongoing dizziness. She is currently unable to complete her home tasks normally and requires rest breaks due to the fatigue and dizziness. I recommended ER evaluation given her symptoms and recent medication  changes as she has started b12 injections. She is to report to the ER for evaluation. No red flag symptoms noted during telehealth evaluation on today.   Follow Up Instructions: I discussed the assessment and treatment plan with the patient. The patient was provided an opportunity to ask questions and all were answered. The patient agreed with the plan and demonstrated an understanding of the instructions.  A copy of instructions were sent to the patient via MyChart unless otherwise noted below.    The patient was advised to call back or seek an in-person evaluation if the symptoms worsen or if the condition fails to improve as anticipated.    Tammy Kidney, PA-C

## 2023-01-25 NOTE — Progress Notes (Signed)
E-Visit for Nausea and Vomiting   We are sorry that you are not feeling well. Here is how we plan to help!  Based on what you have shared with me it looks like you have a Virus that is irritating your GI tract.  Vomiting is the forceful emptying of a portion of the stomach's content through the mouth.  Although nausea and vomiting can make you feel miserable, it's important to remember that these are not diseases, but rather symptoms of an underlying illness.  When we treat short term symptoms, we always caution that any symptoms that persist should be fully evaluated in a medical office.  I have prescribed a medication that will help alleviate your symptoms and allow you to stay hydrated:  Zofran 4 mg 1 tablet every 8 hours as needed for nausea and vomiting  HOME CARE: Drink clear liquids.  This is very important! Dehydration (the lack of fluid) can lead to a serious complication.  Start off with 1 tablespoon every 5 minutes for 8 hours. You may begin eating bland foods after 8 hours without vomiting.  Start with saltine crackers, white bread, rice, mashed potatoes, applesauce. After 48 hours on a bland diet, you may resume a normal diet. Try to go to sleep.  Sleep often empties the stomach and relieves the need to vomit.  GET HELP RIGHT AWAY IF:  Your symptoms do not improve or worsen within 2 days after treatment. You have a fever for over 3 days. You cannot keep down fluids after trying the medication.  MAKE SURE YOU:  Understand these instructions. Will watch your condition. Will get help right away if you are not doing well or get worse.    Thank you for choosing an e-visit.  Your e-visit answers were reviewed by a board certified advanced clinical practitioner to complete your personal care plan. Depending upon the condition, your plan could have included both over the counter or prescription medications.  Please review your pharmacy choice. Make sure the pharmacy is open so  you can pick up prescription now. If there is a problem, you may contact your provider through [employer] and have the prescription routed to another pharmacy.  Your safety is important to [place]. If you have drug allergies check your prescription carefully.   For the next 24 hours you can use MyChart to ask questions about today's visit, request a non-urgent call back, or ask for a work or school excuse. You will get an email in the next two days asking about your experience. I hope that your e-visit has been valuable and will speed your recovery.    have provided 5 minutes of non face to face time during this encounter for chart review and documentation.

## 2023-01-25 NOTE — Patient Instructions (Signed)
  Tammy Decker, thank you for joining Tammy Kidney, PA-C for today's virtual visit.  While this provider is not your primary care provider (PCP), if your PCP is located in our provider database this encounter information will be shared with them immediately following your visit.   A [facility] account gives you access to today's visit and all your visits, tests, and labs performed at [facility] " click here if you don't have a Tammy Decker account or go to [email or web address]  Consent: (Patient) Tammy Decker provided verbal consent for this virtual visit at the beginning of the encounter.  Current Medications:  Current Outpatient Medications:    cyanocobalamin (VITAMIN B12) 1000 MCG/ML injection, Inject 1 ml 1000 mcg total into muscle once a week for three weeks then once a month for three months, Disp: 4 mL, Rfl: 3   folic acid (FOLVITE) 1 MG tablet, Take 1 tablet (1 mg total) by mouth daily. Take one po every day, Disp: 90 tablet, Rfl: 1   ondansetron (ZOFRAN-ODT) 4 MG disintegrating tablet, Take 1 tablet (4 mg total) by mouth every 8 (eight) hours as needed for nausea or vomiting., Disp: 20 tablet, Rfl: 0   propranolol (INDERAL) 20 MG tablet, Take 1 tablet (20 mg total) by mouth in the morning and at bedtime., Disp: 60 tablet, Rfl: 2   SYRINGE-NEEDLE, DISP, 3 ML 25G X 5/8" 3 ML MISC, Use to inject b12 IM once weekly for four weeks then once monthly for three months, Disp: 7 each, Rfl: 0   Medications ordered in this encounter:  No orders of the defined types were placed in this encounter.    *If you need refills on other medications prior to your next appointment, please contact your pharmacy*  Follow-Up: Call back or seek an in-person evaluation if the symptoms worsen or if the condition fails to improve as anticipated.  [facility] [phone]  Other Instructions Report to nearest ER for evaluation.    If you have  been instructed to have an in-person evaluation today at a local Urgent Care facility, please use the link below. It will take you to a list of all of our available [facility], including address, phone number and hours of operation. Please do not delay care.  [facility]  If you or a family member do not have a primary care provider, use the link below to schedule a visit and establish care. When you choose a [facility] primary care physician or advanced practice provider, you gain a long-term partner in health. Find a Primary Care Provider  Learn more about [facility] in-office and virtual care options:  - Get Care Now

## 2023-01-28 ENCOUNTER — Encounter: Payer: Self-pay | Admitting: Internal Medicine

## 2023-01-28 ENCOUNTER — Ambulatory Visit (INDEPENDENT_AMBULATORY_CARE_PROVIDER_SITE_OTHER): Payer: No Typology Code available for payment source | Admitting: Internal Medicine

## 2023-01-28 VITALS — BP 112/70 | HR 84 | Temp 98.2°F | Ht 66.0 in | Wt 201.0 lb

## 2023-01-28 DIAGNOSIS — G4489 Other headache syndrome: Secondary | ICD-10-CM | POA: Diagnosis not present

## 2023-01-28 DIAGNOSIS — R519 Headache, unspecified: Secondary | ICD-10-CM | POA: Insufficient documentation

## 2023-01-28 MED ORDER — DOXYCYCLINE HYCLATE 100 MG PO TABS: 100.0000 mg | ORAL_TABLET | Freq: Two times a day (BID) | ORAL | 0 refills | Status: DC

## 2023-01-28 NOTE — Assessment & Plan Note (Addendum)
With nausea and feeling sick Not migraine COVID negative Had insect bite last week--not sure if it could be a tick. No other features of tick fever--but will try doxy 100 bid x 7 days for empiric Rx Analgesics for headache Has zofran from virtual visit---does help briefly  I don't think this is related to the B12 injection or folate

## 2023-01-28 NOTE — Progress Notes (Signed)
Subjective:    Patient ID: Tammy Decker, female    DOB: 10-09-1970, 52 y.o.   MRN: [id number]  HPI Here due to headache and other symptoms--after last B12 injection  Injection 6 days ago Noticed dark circles under eyes the next day Next day --headache and diarrhea Then with nausea the next day--stayed in bed 3 days ago--slightly better  Tingling in hands also Stopped the propranolol after HR in 50's with nausea episode  Still with nausea and headache Tested for COVID---negative Some relief with tylenol  No fever No cough or respiratory symptoms Hasn't had any meat--is alpha gal positive  Headache on top of head Pressure across temples helps Nausea in waves--did try OTC for nausea. Helps some  Current Outpatient Medications on File Prior to Visit  Medication Sig Dispense Refill   cyanocobalamin (VITAMIN B12) 1000 MCG/ML injection Inject 1 ml 1000 mcg total into muscle once a week for three weeks then once a month for three months 4 mL 3   folic acid (FOLVITE) 1 MG tablet Take 1 tablet (1 mg total) by mouth daily. Take one po every day 90 tablet 1   ondansetron (ZOFRAN-ODT) 4 MG disintegrating tablet Take 1 tablet (4 mg total) by mouth every 8 (eight) hours as needed for nausea or vomiting. 20 tablet 0   propranolol (INDERAL) 20 MG tablet Take 1 tablet (20 mg total) by mouth in the morning and at bedtime. 60 tablet 2   SYRINGE-NEEDLE, DISP, 3 ML 25G X 5/8" 3 ML MISC Use to inject b12 IM once weekly for four weeks then once monthly for three months 7 each 0   No current facility-administered medications on file prior to visit.    Allergies  Allergen Reactions   Ciprofloxacin Anaphylaxis and Hives   Kiwi Extract Anaphylaxis   Metoprolol    Other     Ticks, beef, lamb, pork   Sulfa Antibiotics Hives    Past Medical History:  Diagnosis Date   Asthma    Complication of anesthesia    OXYGEN SOMETIMES DROPS LOW   GERD (gastroesophageal reflux disease)     Hypertension    Pyelonephritis    Tachycardia 2020    Past Surgical History:  Procedure Laterality Date   BACK SURGERY     CHOLECYSTECTOMY     ESOPHAGOGASTRODUODENOSCOPY (EGD) WITH PROPOFOL N/A 06/19/2021   Procedure: ESOPHAGOGASTRODUODENOSCOPY (EGD) WITH PROPOFOL;  Surgeon: Toney Reil, MD;  Location: [facility] ENDOSCOPY;  Service: Gastroenterology;  Laterality: N/A;   NECK SURGERY     uterine ablation     XI ROBOTIC ASSISTED VENTRAL HERNIA N/A 05/09/2020   Procedure: XI ROBOTIC ASSISTED VENTRAL HERNIA, incisional hernia;  Surgeon: Leafy Ro, MD;  Location: [facility] ORS;  Service: General;  Laterality: N/A;    Family History  Problem Relation Age of Onset   Diabetes Mother    Diabetes Father    Heart attack Father    Ovarian cancer Maternal Grandmother        older dx maybe early 25s   Colon cancer Maternal Grandfather        in his 88's   Early death Paternal Grandfather    Heart attack Paternal Grandfather     Social History   Socioeconomic History   Marital status: Married    Spouse name: Not on file   Number of children: Not on file   Years of education: Not on file   Highest education level: Not on file  Occupational History  Not on file  Tobacco Use   Smoking status: Every Day    Current packs/day: 0.15    Types: Cigarettes    Passive exposure: Past   Smokeless tobacco: Never   Tobacco comments:    down to 3 cigs per day  Vaping Use   Vaping status: Never Used  Substance and Sexual Activity   Alcohol use: Yes    Comment: 1/5 whiskey nightly   Drug use: Never   Sexual activity: Yes    Partners: Male    Birth control/protection: None, Post-menopausal  Other Topics Concern   Not on file  Social History Narrative   Not on file   Social Determinants of Health   Financial Resource Strain: Not on file  Food Insecurity: No Food Insecurity (04/20/2022)   Hunger Vital Sign    Worried About Running Out of Food in the Last Year: Never true    Ran Out of  Food in the Last Year: Never true  Transportation Needs: No Transportation Needs (04/20/2022)   PRAPARE - Administrator, Civil Service (Medical): No    Lack of Transportation (Non-Medical): No  Physical Activity: Not on file  Stress: Not on file  Social Connections: Not on file  Intimate Partner Violence: Not At Risk (04/20/2022)   Humiliation, Afraid, Rape, and Kick questionnaire    Fear of Current or Ex-Partner: No    Emotionally Abused: No    Physically Abused: No    Sexually Abused: No   Review of Systems Diarrhea is gone Eating a little--between nausea spells Sleeping okay Past pancreatitis--but not drinking anymore Did have a bite on her arm last week--no known tick bite--but spends a lot of time outside No rash    Objective:   Physical Exam Constitutional:      Appearance: Normal appearance.  Cardiovascular:     Rate and Rhythm: Normal rate and regular rhythm.     Heart sounds: No murmur heard.    No gallop.  Pulmonary:     Effort: Pulmonary effort is normal.     Breath sounds: Normal breath sounds. No wheezing or rales.  Abdominal:     General: Bowel sounds are normal. There is no distension.     Palpations: Abdomen is soft.     Tenderness: There is no guarding or rebound.     Comments: Mild mid abdominal tenderness  Musculoskeletal:     Cervical back: Normal range of motion and neck supple. No rigidity or tenderness.  Lymphadenopathy:     Cervical: No cervical adenopathy.  Neurological:     General: No focal deficit present.     Mental Status: She is alert.            Assessment & Plan:

## 2023-02-03 ENCOUNTER — Encounter: Payer: Self-pay | Admitting: Family

## 2023-02-03 ENCOUNTER — Ambulatory Visit (INDEPENDENT_AMBULATORY_CARE_PROVIDER_SITE_OTHER): Payer: No Typology Code available for payment source | Admitting: Family

## 2023-02-03 VITALS — BP 134/86 | HR 69 | Temp 98.0°F | Ht 66.0 in | Wt 205.0 lb

## 2023-02-03 DIAGNOSIS — R7989 Other specified abnormal findings of blood chemistry: Secondary | ICD-10-CM

## 2023-02-03 DIAGNOSIS — R1010 Upper abdominal pain, unspecified: Secondary | ICD-10-CM | POA: Diagnosis not present

## 2023-02-03 DIAGNOSIS — R1013 Epigastric pain: Secondary | ICD-10-CM | POA: Diagnosis not present

## 2023-02-03 DIAGNOSIS — K860 Alcohol-induced chronic pancreatitis: Secondary | ICD-10-CM

## 2023-02-03 DIAGNOSIS — I499 Cardiac arrhythmia, unspecified: Secondary | ICD-10-CM | POA: Insufficient documentation

## 2023-02-03 DIAGNOSIS — R311 Benign essential microscopic hematuria: Secondary | ICD-10-CM | POA: Diagnosis not present

## 2023-02-03 DIAGNOSIS — R310 Gross hematuria: Secondary | ICD-10-CM

## 2023-02-03 DIAGNOSIS — E538 Deficiency of other specified B group vitamins: Secondary | ICD-10-CM

## 2023-02-03 DIAGNOSIS — R000 Tachycardia, unspecified: Secondary | ICD-10-CM

## 2023-02-03 DIAGNOSIS — R739 Hyperglycemia, unspecified: Secondary | ICD-10-CM | POA: Diagnosis not present

## 2023-02-03 DIAGNOSIS — J3489 Other specified disorders of nose and nasal sinuses: Secondary | ICD-10-CM

## 2023-02-03 LAB — HEMOGLOBIN A1C: Hgb A1c MFr Bld: 6.1 % (ref 4.6–6.5)

## 2023-02-03 LAB — COMPREHENSIVE METABOLIC PANEL
ALT: 6 U/L (ref 0–35)
AST: 11 U/L (ref 0–37)
Albumin: 4.1 g/dL (ref 3.5–5.2)
Alkaline Phosphatase: 113 U/L (ref 39–117)
BUN: 5 mg/dL — ABNORMAL LOW (ref 6–23)
CO2: 25 meq/L (ref 19–32)
Calcium: 9 mg/dL (ref 8.4–10.5)
Chloride: 104 meq/L (ref 96–112)
Creatinine, Ser: 0.48 mg/dL (ref 0.40–1.20)
GFR: 110.11 mL/min (ref >60.00)
Glucose, Bld: 85 mg/dL (ref 70–99)
Potassium: 3.9 meq/L (ref 3.5–5.1)
Sodium: 137 meq/L (ref 135–145)
Total Bilirubin: 0.4 mg/dL (ref 0.2–1.2)
Total Protein: 7.1 g/dL (ref 6.0–8.3)

## 2023-02-03 LAB — CBC WITH DIFFERENTIAL/PLATELET
Basophils Absolute: 0.1 10*3/uL (ref 0.0–0.1)
Basophils Relative: 0.8 % (ref 0.0–3.0)
Eosinophils Absolute: 0.1 10*3/uL (ref 0.0–0.7)
Eosinophils Relative: 1.1 % (ref 0.0–5.0)
HCT: 43.7 % (ref 36.0–46.0)
Hemoglobin: 14.3 g/dL (ref 12.0–15.0)
Lymphocytes Relative: 31.6 % (ref 12.0–46.0)
Lymphs Abs: 2.7 10*3/uL (ref 0.7–4.0)
MCHC: 32.8 g/dL (ref 30.0–36.0)
MCV: 94.8 fl (ref 78.0–100.0)
Monocytes Absolute: 0.4 10*3/uL (ref 0.1–1.0)
Monocytes Relative: 5.2 % (ref 3.0–12.0)
Neutro Abs: 5.3 10*3/uL (ref 1.4–7.7)
Neutrophils Relative %: 61.3 % (ref 43.0–77.0)
Platelets: 295 10*3/uL (ref 150.0–400.0)
RBC: 4.6 Mil/uL (ref 3.87–5.11)
RDW: 14.2 % (ref 11.5–15.5)
WBC: 8.7 10*3/uL (ref 4.0–10.5)

## 2023-02-03 LAB — LIPASE: Lipase: 15 U/L (ref 11.0–59.0)

## 2023-02-03 LAB — TSH: TSH: 2.34 u[IU]/mL (ref 0.35–5.50)

## 2023-02-03 LAB — AMYLASE: Amylase: 35 U/L (ref 27–131)

## 2023-02-03 MED ORDER — PANTOPRAZOLE SODIUM 20 MG PO TBEC: 20.0000 mg | DELAYED_RELEASE_TABLET | Freq: Every day | ORAL | 0 refills | Status: DC

## 2023-02-03 MED ORDER — PREDNISONE 10 MG (21) PO TBPK: ORAL_TABLET | ORAL | 0 refills | Status: DC

## 2023-02-03 NOTE — Assessment & Plan Note (Signed)
Cont b12 injections as instructed. Unable to tolerate b12 otc

## 2023-02-03 NOTE — Patient Instructions (Signed)
   Recommend daily flonase and also zyrtec at night for allergies.   ------------------------------------  Hold prednisone until liver function comes back. If normal can take prednisone.

## 2023-02-03 NOTE — Progress Notes (Signed)
Established Patient Office Visit  Subjective:   Patient ID: Tammy Decker, female    DOB: December 02, 1970  Age: 52 y.o. MRN: [id number]  CC:  Chief Complaint  Patient presents with  . Follow-up    Reports headache, dizziness and nausea x1 week. Saw Dr. Alphonsus Sias for the same symptoms on 01/28/23.    HPI: Tammy Decker is a 52 y.o. female presenting on 02/03/2023 for Follow-up (Reports headache, dizziness and nausea x1 week. Saw Dr. Alphonsus Sias for the same symptoms on 01/28/23.)  Came in our office and saw Dr. Alphonsus Sias for headache and diarrhea with nausea, seen on 01/28/23. Covid was negative. Headache has improved however lingering slightly, still able to work and move around without issue. Is taking tylenol to help with the headache but now that she is taking tylenol she is having upper abdominal pain. Worried about recurrent pancreatitis. Environmental allergies but denies nasal congestion, sore throat ear pain and or cough.is having some heart burn feels irritated in epigastric pain. Was taking omeprazole but stopped a few weeks ago because of how she was feeling she wanted to stop everything.  Had fatigue, and low heart rate so she stopped her propanol.  She has also tried metoprolol in the past but made her feel very tired.   Did wipe when she peed with slight pink discoloration. Not after sex. Not sure if vaginal or urethral.   Wt Readings from Last 3 Encounters:  02/03/23 205 lb (93 kg)  01/28/23 201 lb (91.2 kg)  01/09/23 201 lb 12.8 oz (91.5 kg)   Temp Readings from Last 3 Encounters:  02/03/23 98 F (36.7 C) (Temporal)  01/28/23 98.2 F (36.8 C) (Oral)  01/09/23 98.2 F (36.8 C) (Temporal)   BP Readings from Last 3 Encounters:  02/03/23 134/86  01/28/23 112/70  01/09/23 126/82   Pulse Readings from Last 3 Encounters:  02/03/23 69  01/28/23 84  01/09/23 74       ROS: Negative unless specifically indicated above in HPI.   Relevant past medical history  reviewed and updated as indicated.   Allergies and medications reviewed and updated.   Current Outpatient Medications:  .  cyanocobalamin (VITAMIN B12) 1000 MCG/ML injection, Inject 1 ml 1000 mcg total into muscle once a week for three weeks then once a month for three months, Disp: 4 mL, Rfl: 3 .  folic acid (FOLVITE) 1 MG tablet, Take 1 tablet (1 mg total) by mouth daily. Take one po every day, Disp: 90 tablet, Rfl: 1 .  ondansetron (ZOFRAN-ODT) 4 MG disintegrating tablet, Take 1 tablet (4 mg total) by mouth every 8 (eight) hours as needed for nausea or vomiting., Disp: 20 tablet, Rfl: 0 .  pantoprazole (PROTONIX) 20 MG tablet, Take 1 tablet (20 mg total) by mouth daily., Disp: 30 tablet, Rfl: 0 .  predniSONE (STERAPRED UNI-PAK 21 TAB) 10 MG (21) TBPK tablet, Take as directed, Disp: 1 each, Rfl: 0 .  SYRINGE-NEEDLE, DISP, 3 ML 25G X 5/8" 3 ML MISC, Use to inject b12 IM once weekly for four weeks then once monthly for three months, Disp: 7 each, Rfl: 0  Allergies  Allergen Reactions  . Ciprofloxacin Anaphylaxis and Hives  . Kiwi Extract Anaphylaxis  . Metoprolol   . Other     Ticks, beef, lamb, pork  . Propranolol Other (See Comments)    fatigue  . Sulfa Antibiotics Hives    Objective:   BP 134/86 (BP Location: Left Arm, Patient Position: Sitting, Cuff Size:  Normal)   Pulse 69   Temp 98 F (36.7 C) (Temporal)   Ht 5\' 6"  (1.676 m)   Wt 205 lb (93 kg)   SpO2 98%   BMI 33.09 kg/m    Physical Exam Constitutional:      General: She is not in acute distress.    Appearance: Normal appearance. She is normal weight. She is not ill-appearing, toxic-appearing or diaphoretic.  HENT:     Head: Normocephalic.     Right Ear: Tympanic membrane normal.     Left Ear: Tympanic membrane normal.     Nose:     Right Sinus: Maxillary sinus tenderness present.     Left Sinus: Maxillary sinus tenderness present.     Mouth/Throat:     Mouth: Mucous membranes are dry.     Pharynx: No  oropharyngeal exudate or posterior oropharyngeal erythema.  Eyes:     Extraocular Movements: Extraocular movements intact.     Pupils: Pupils are equal, round, and reactive to light.  Cardiovascular:     Rate and Rhythm: Normal rate and regular rhythm. Occasional Extrasystoles are present.    Pulses: Normal pulses.     Heart sounds: Normal heart sounds.  Pulmonary:     Effort: Pulmonary effort is normal.     Breath sounds: Normal breath sounds.  Abdominal:     General: Bowel sounds are normal.     Tenderness: There is abdominal tenderness in the epigastric area and left upper quadrant. There is no guarding or rebound.  Musculoskeletal:     Cervical back: Normal range of motion.     Right lower leg: No edema.     Left lower leg: No edema.  Neurological:     General: No focal deficit present.     Mental Status: She is alert and oriented to person, place, and time. Mental status is at baseline.  Psychiatric:        Mood and Affect: Mood normal.        Behavior: Behavior normal.        Thought Content: Thought content normal.        Judgment: Judgment normal.    Assessment & Plan:  Upper abdominal pain Assessment & Plan: Rx pantoprazole  Ordering amylase lipase cmp  Ddx pancreatitis, elevated lft's (try to decrease use of tylenol)  Prednisone pack for sinus pressure however advised pt to pend liver function tests   Orders: -     Comprehensive metabolic panel -     Amylase -     Lipase -     CBC with Differential/Platelet -     Pantoprazole Sodium; Take 1 tablet (20 mg total) by mouth daily.  Dispense: 30 tablet; Refill: 0  Alcohol-induced chronic pancreatitis (HCC) -     Comprehensive metabolic panel -     Amylase -     Lipase  Epigastric pain -     Pantoprazole Sodium; Take 1 tablet (20 mg total) by mouth daily.  Dispense: 30 tablet; Refill: 0  Abnormal thyroid blood test -     TSH  Benign essential microscopic hematuria -     Urinalysis w microscopic + reflex  cultur  Irregular heart beat Assessment & Plan: EKG in office today  NSR  Limit caffeine  Orders: -     EKG 12-Lead  Sinus pressure -     predniSONE; Take as directed  Dispense: 1 each; Refill: 0  Hyperglycemia -     Hemoglobin A1c  Low serum vitamin  B12 Assessment & Plan: Cont b12 injections as instructed. Unable to tolerate b12 otc    Tachycardia Assessment & Plan: For now stay off of BB  Failed propanolol and also metoprolol  Can consider bystolic if heart rate returns to tachy  Suspect dehydration may have decreased HR  Advised pt to monitor  Maintain consult appt with cardiology       Follow up plan: Return in about 6 months (around 08/04/2023) for f/u CPE.  Mort Sawyers, FNP

## 2023-02-03 NOTE — Assessment & Plan Note (Signed)
EKG in office today  NSR  Limit caffeine

## 2023-02-03 NOTE — Assessment & Plan Note (Signed)
Rx pantoprazole  Ordering amylase lipase cmp  Ddx pancreatitis, elevated lft's (try to decrease use of tylenol)  Prednisone pack for sinus pressure however advised pt to pend liver function tests

## 2023-02-03 NOTE — Assessment & Plan Note (Signed)
For now stay off of BB  Failed propanolol and also metoprolol  Can consider bystolic if heart rate returns to tachy  Suspect dehydration may have decreased HR  Advised pt to monitor  Maintain consult appt with cardiology  EKG in office NSR

## 2023-02-05 LAB — URINALYSIS W MICROSCOPIC + REFLEX CULTURE
Bacteria, UA: NONE SEEN /[HPF]
Bilirubin Urine: NEGATIVE
Glucose, UA: NEGATIVE
Hgb urine dipstick: NEGATIVE
Hyaline Cast: NONE SEEN /[LPF]
Nitrites, Initial: NEGATIVE
Protein, ur: NEGATIVE
Specific Gravity, Urine: 1.026 (ref 1.001–1.035)
Squamous Epithelial / HPF: NONE SEEN /HPF (ref <=5)
WBC, UA: NONE SEEN /[HPF] (ref 0–5)
pH: 5.5 (ref 5.0–8.0)

## 2023-02-05 LAB — URINE CULTURE
MICRO NUMBER:: 15799859
Result:: NO GROWTH
SPECIMEN QUALITY:: ADEQUATE

## 2023-02-05 LAB — CULTURE INDICATED

## 2023-02-06 ENCOUNTER — Ambulatory Visit: Payer: No Typology Code available for payment source | Admitting: Family

## 2023-02-07 ENCOUNTER — Encounter: Payer: Self-pay | Admitting: Family

## 2023-02-07 ENCOUNTER — Other Ambulatory Visit: Payer: Self-pay | Admitting: Family

## 2023-02-07 DIAGNOSIS — T7800XA Anaphylactic reaction due to unspecified food, initial encounter: Secondary | ICD-10-CM

## 2023-02-13 MED ORDER — EPINEPHRINE 0.3 MG/0.3ML IJ SOAJ: 0.3000 mg | INTRAMUSCULAR | 1 refills | Status: AC | PRN

## 2023-03-11 ENCOUNTER — Encounter: Payer: Self-pay | Admitting: Family

## 2023-03-11 ENCOUNTER — Telehealth: Payer: No Typology Code available for payment source | Admitting: Physician Assistant

## 2023-03-11 DIAGNOSIS — R0981 Nasal congestion: Secondary | ICD-10-CM | POA: Diagnosis not present

## 2023-03-11 DIAGNOSIS — A084 Viral intestinal infection, unspecified: Secondary | ICD-10-CM

## 2023-03-11 NOTE — Progress Notes (Signed)
 Virtual Visit Consent   Tammy Decker, you are scheduled for a virtual visit with a [facility] provider today. Just as with appointments in the office, your consent must be obtained to participate. Your consent will be active for this visit and any virtual visit you may have with one of our providers in the next 365 days. If you have a MyChart account, a copy of this consent can be sent to you electronically.  As this is a virtual visit, video technology does not allow for your provider to perform a traditional examination. This may limit your provider's ability to fully assess your condition. If your provider identifies any concerns that need to be evaluated in person or the need to arrange testing (such as labs, EKG, etc.), we will make arrangements to do so. Although advances in technology are sophisticated, we cannot ensure that it will always work on either your end or our end. If the connection with a video visit is poor, the visit may have to be switched to a telephone visit. With either a video or telephone visit, we are not always able to ensure that we have a secure connection.  By engaging in this virtual visit, you consent to the provision of healthcare and authorize for your insurance to be billed (if applicable) for the services provided during this visit. Depending on your insurance coverage, you may receive a charge related to this service.  I need to obtain your verbal consent now. Are you willing to proceed with your visit today? Tammy Decker has provided verbal consent on 03/11/2023 for a virtual visit (video or telephone). [name], PA-C  Date: 03/11/2023 11:03 AM  Virtual Visit via Video Note   I, [name], connected with  Tammy Decker  ([id number], Jul 26, 1970) on 03/11/23 at 10:00 AM EST by a video-enabled telemedicine application and verified that I am speaking with the correct person using two identifiers.  Location: Patient: Virtual  Visit Location Patient: Home Provider: Virtual Visit Location Provider: Home Office   I discussed the limitations of evaluation and management by telemedicine and the availability of in person appointments. The patient expressed understanding and agreed to proceed.    History of Present Illness: Tammy Decker is a 53 y.o. who identifies as a female who was assigned female at birth, and is being seen today for flu-like symptoms.  HPI: URI  This is a new problem. The current episode started in the past 7 days (Sunday 03/09/23). The problem has been gradually worsening. There has been no fever. Associated symptoms include abdominal pain (cramping), congestion, diarrhea (worst symptom), headaches, nausea, rhinorrhea (and post nasal drainage), a sore throat and vomiting. Pertinent negatives include no chest pain, coughing, ear pain, plugged ear sensation or sinus pain. Associated symptoms comments: General malaise. She has tried increased fluids (dayquil) for the symptoms. The treatment provided no relief.  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   Prediabetes 07/31/2022   Seasonal allergic rhinitis due to pollen 07/25/2022   Hepatic steatosis 04/23/2022  Hepatomegaly 04/23/2022   Cigarette smoker motivated to quit 04/23/2022   Renal cyst, acquired, left 01/22/2022   H/O pyelonephritis 01/22/2022   Proteinuria 01/22/2022   Asthma 01/08/2022   Obesity with body mass index (BMI) of 30.0 to 39.9 01/08/2022   Anxiety and depression  12/07/2021   Menopause 12/07/2021   Hyperglycemia 12/07/2021   H/o Lyme disease 12/07/2021   Chronic GERD    MDD (major depressive disorder), recurrent episode, moderate (HCC) 09/13/2020   Essential hypertension 09/11/2020   Degeneration of lumbar intervertebral disc 09/13/2013   Chronic back pain 09/01/2012    Allergies:  Allergies  Allergen Reactions   Ciprofloxacin Anaphylaxis and Hives   Kiwi Extract Anaphylaxis   Metoprolol     Other     Ticks, beef, lamb, pork   Propranolol  Other (See Comments)    fatigue   Sulfa Antibiotics Hives   Medications:  Current Outpatient Medications:    cyanocobalamin  (VITAMIN B12) 1000 MCG/ML injection, Inject 1 ml 1000 mcg total into muscle once a week for three weeks then once a month for three months, Disp: 4 mL, Rfl: 3   EPINEPHrine  0.3 mg/0.3 mL IJ SOAJ injection, Inject 0.3 mg into the muscle as needed for anaphylaxis., Disp: 1 each, Rfl: 1   folic acid  (FOLVITE ) 1 MG tablet, Take 1 tablet (1 mg total) by mouth daily. Take one po every day, Disp: 90 tablet, Rfl: 1   ondansetron  (ZOFRAN -ODT) 4 MG disintegrating tablet, Take 1 tablet (4 mg total) by mouth every 8 (eight) hours as needed for nausea or vomiting., Disp: 20 tablet, Rfl: 0   pantoprazole  (PROTONIX ) 20 MG tablet, Take 1 tablet (20 mg total) by mouth daily., Disp: 30 tablet, Rfl: 0   predniSONE  (STERAPRED UNI-PAK 21 TAB) 10 MG (21) TBPK tablet, Take as directed, Disp: 1 each, Rfl: 0   SYRINGE-NEEDLE, DISP, 3 ML 25G X 5/8 3 ML MISC, Use to inject b12 IM once weekly for four weeks then once monthly for three months, Disp: 7 each, Rfl: 0  Observations/Objective: Patient is well-developed, well-nourished in no acute distress.  Resting comfortably at home.  Head is normocephalic, atraumatic.  No labored breathing.  Speech is clear and coherent with logical content.  Patient is alert and oriented at baseline.    Assessment and Plan: 1. Viral gastroenteritis (Primary)  2. Head  congestion  - Possible Norovirus, or combination Viral gastroenteritis and viral URI - Symptomatic medications of choice over the counter as needed - Push fluids - Rest - Seek further evaluation if symptoms change or worsen   Follow Up Instructions: I discussed the assessment and treatment plan with the patient. The patient was provided an opportunity to ask questions and all were answered. The patient agreed with the plan and demonstrated an understanding of the instructions.  A copy of instructions were sent to the patient via MyChart unless otherwise noted below.    The patient was advised to call back or seek an in-person evaluation if the symptoms worsen or if the condition fails to improve as anticipated.    [name], PA-C

## 2023-03-11 NOTE — Patient Instructions (Signed)
 Tammy Decker, thank you for joining Tammy CHRISTELLA Dickinson, PA-C for today's virtual visit.  While this provider is not your primary care provider (PCP), if your PCP is located in our provider database this encounter information will be shared with them immediately following your visit.   A [facility] MyChart account gives you access to today's visit and all your visits, tests, and labs performed at [facility]  click here if you don't have a [facility] MyChart account or go to [email or web address]  Consent: (Patient) Tammy Decker provided verbal consent for this virtual visit at the beginning of the encounter.  Current Medications:  Current Outpatient Medications:    cyanocobalamin  (VITAMIN B12) 1000 MCG/ML injection, Inject 1 ml 1000 mcg total into muscle once a week for three weeks then once a month for three months, Disp: 4 mL, Rfl: 3   EPINEPHrine  0.3 mg/0.3 mL IJ SOAJ injection, Inject 0.3 mg into the muscle as needed for anaphylaxis., Disp: 1 each, Rfl: 1   folic acid  (FOLVITE ) 1 MG tablet, Take 1 tablet (1 mg total) by mouth daily. Take one po every day, Disp: 90 tablet, Rfl: 1   ondansetron  (ZOFRAN -ODT) 4 MG disintegrating tablet, Take 1 tablet (4 mg total) by mouth every 8 (eight) hours as needed for nausea or vomiting., Disp: 20 tablet, Rfl: 0   pantoprazole  (PROTONIX ) 20 MG tablet, Take 1 tablet (20 mg total) by mouth daily., Disp: 30 tablet, Rfl: 0   predniSONE  (STERAPRED UNI-PAK 21 TAB) 10 MG (21) TBPK tablet, Take as directed, Disp: 1 each, Rfl: 0   SYRINGE-NEEDLE, DISP, 3 ML 25G X 5/8 3 ML MISC, Use to inject b12 IM once weekly for four weeks then once monthly for three months, Disp: 7 each, Rfl: 0   Medications ordered in this encounter:  No orders of the defined types were placed in this encounter.    *If you need refills on other medications prior to your next appointment, please contact your pharmacy*  Follow-Up: Call back or seek an  in-person evaluation if the symptoms worsen or if the condition fails to improve as anticipated.   Virtual Care [phone]  Other Instructions  Norovirus Infection Norovirus infection causes inflammation in the stomach and intestines (gastroenteritis) and food poisoning. It is caused by exposure to a virus from a group of similar viruses called noroviruses. Norovirus spreads very easily from person to person (is very contagious). It often occurs in places where people are in close contact, such as schools, nursing homes, restaurants, and cruise ships. You can get it from food, water , surfaces, or other people who have the virus. Norovirus is also found in the stool (feces) or vomit of infected people. You can spread the infection as soon as you feel sick, and you may continue to be contagious after you recover. What are the causes? This condition is caused by contact with norovirus. You can catch norovirus if you: Eat or drink something that is contaminated with norovirus. Touch surfaces or objects that are contaminated with norovirus and then put your hand in or by your mouth or nose. Have direct contact with an infected person who may or may not still have symptoms. Share food, drink, or utensils with someone who is contagious with norovirus. What are the signs or symptoms? Symptoms usually begin within 12 hours to 2 days after you become infected. Most norovirus symptoms affect the digestive system.Symptoms may include: Nausea, vomiting, and diarrhea. Stomach cramps. Fever. Chills. Headache. Muscle  aches and tiredness. How is this diagnosed? This condition may be diagnosed based on: Your symptoms. A physical exam. A stool test. How is this treated? There is no specific treatment for norovirus. Most people get better without treatment in about 2 days. Young children, the elderly, and people who are already sick may take up to 6 days to recover. Follow these  instructions at home:  Eating and drinking  Drink plenty of water  to replace fluids that are lost through diarrhea and vomiting. This prevents dehydration. Drink enough fluid to keep your urine pale yellow. Drink clear fluids in small amounts as you are able. Clear fluids include water , ice chips, fruit juice with water  added (diluted fruit juice), and low-calorie sports drinks. Avoid fluids that contain a lot of sugar or caffeine, such as energy drinks, sports drinks, and soda. Avoid alcohol. If instructed by your health care provider, drink an oral rehydration solution (ORS). This is a drink that is sold at pharmacies and retail stores. An ORS contains minerals (electrolytes) that you can lose through diarrhea and vomiting. Eat bland, easy-to-digest foods in small amounts as you are able. These foods include rice, lean meats, toast, and crackers. Avoid spicy or fatty foods. General instructions Rest at home while you recover. Do not prepare food for others while you are infected. Wait at least 3 days after you recover from the illness to do this. Take over-the-counter and prescription medicines only as told by your health care provider. Wash your hands frequently with soap and water  for at least 20 seconds. Alcohol-based hand sanitizer can be used in addition to soap and water , but sanitizer should not be the only cleansing method because it is not effective at removing norovirus from your hands or surfaces. Make sure that each person in your household washes his or her hands well and often. Keep all follow-up visits. This is important. How is this prevented? To help prevent the spread of norovirus: Stay at home if you are feeling sick. This will reduce the risk of spreading the virus to others. Wash your hands often with soap and water  for at least 20 seconds, especially after using the toilet, helping a child use the toilet, or changing a child's diaper. Wash fruits and vegetables  thoroughly before peeling, preparing, or serving them. Throw out any food that a sick person may have touched. Disinfect contaminated surfaces immediately after someone in the household has been sick. Disinfect frequently used surfaces, such as counters, doorknobs, and faucets. Use a bleach-based household cleaner. Immediately remove and wash soiled clothes or sheets. Contact a health care provider if: You have vomiting, diarrhea, or stomach pain that gets worse. You have symptoms that do not go away after 3-6 days. You have a fever. You cannot drink without vomiting. You feel light-headed or dizzy. Your symptoms get worse. Get help right away if: You develop symptoms of dehydration that do not improve with fluid replacement, such as: Excessive sleepiness. Lack of tears. Very little urine production. Dry mouth. Muscle cramps. Weak pulse. Confusion. Summary Norovirus infection is common and often occurs in places where people are in close contact, such as schools, nursing homes, restaurants, and cruise ships. To help prevent the spread of this infection, wash hands with soap and water  for at least 20 seconds before handling food or after having contact with stool or body fluids. There is no specific treatment for norovirus, but most people get better without treatment in about 2 days. People who are healthy when  infected often recover sooner than those who are elderly, young, or already sick. Replace lost fluids by drinking plenty of water , or by drinking oral rehydration solution (ORS), which contains important minerals called electrolytes. This prevents dehydration. This information is not intended to replace advice given to you by your health care provider. Make sure you discuss any questions you have with your health care provider. Document Revised: 09/27/2020 Document Reviewed: 09/27/2020 Elsevier Patient Education  2024 [employer].    If you have been instructed to have an  in-person evaluation today at a local Urgent Care facility, please use the link below. It will take you to a list of all of our available [facility], including address, phone number and hours of operation. Please do not delay care.  [facility]  If you or a family member do not have a primary care provider, use the link below to schedule a visit and establish care. When you choose a [facility] primary care physician or advanced practice provider, you gain a long-term partner in health. Find a Primary Care Provider  Learn more about [facility]'s in-office and virtual care options: [facility] - Get Care Now

## 2023-03-14 ENCOUNTER — Encounter: Payer: Self-pay | Admitting: *Deleted

## 2023-03-19 ENCOUNTER — Ambulatory Visit: Payer: No Typology Code available for payment source | Admitting: Neurology

## 2023-03-19 ENCOUNTER — Encounter: Payer: Self-pay | Admitting: Neurology

## 2023-03-29 ENCOUNTER — Other Ambulatory Visit: Payer: Self-pay | Admitting: Family

## 2023-03-29 DIAGNOSIS — E538 Deficiency of other specified B group vitamins: Secondary | ICD-10-CM

## 2023-04-01 ENCOUNTER — Ambulatory Visit: Payer: No Typology Code available for payment source | Admitting: Dermatology

## 2023-04-11 ENCOUNTER — Encounter: Payer: Self-pay | Admitting: Family Medicine

## 2023-04-11 ENCOUNTER — Ambulatory Visit: Payer: Self-pay | Admitting: Family Medicine

## 2023-04-11 VITALS — BP 124/76 | HR 105 | Temp 98.3°F | Ht 66.0 in | Wt 200.4 lb

## 2023-04-11 DIAGNOSIS — E538 Deficiency of other specified B group vitamins: Secondary | ICD-10-CM

## 2023-04-11 LAB — B12 AND FOLATE PANEL
Folate: 6.7 ng/mL (ref >5.9)
Vitamin B-12: 493 pg/mL (ref 211–911)

## 2023-04-11 NOTE — Progress Notes (Signed)
 Patient ID: Tammy Decker, female    DOB: 1970-04-03, 53 y.o.   MRN: [id number]  This visit was conducted in person.  BP 124/76 (BP Location: Left Arm, Patient Position: Sitting, Cuff Size: Normal)   Pulse (!) 105   Temp 98.3 F (36.8 C) (Temporal)   Ht 5' 6 (1.676 m)   Wt 200 lb 6 oz (90.9 kg)   SpO2 96%   BMI 32.34 kg/m    CC:  Chief Complaint  Patient presents with   Dizziness   Shortness of Breath        Eye Problem    Black spots in Periferal   Tachycardia   Vitamin B12 Defiency    Last B12 injection on Saturday    Subjective:   HPI: Tammy Decker is a 53 y.o. female  patient presenting on 04/11/2023 for Dizziness, Shortness of Breath (/), Eye Problem (Black spots in Periferal), Tachycardia, and Vitamin B12 Defiency (Last B12 injection on Saturday)  Has been on B12 injections... stopped with the flu, has been using every other week in last few months.  Took last B12 injection Saturday.  When she gets low B12 she feels dizziness, SOB and fatigue, muscle ache, back spots.. floaters in eyes...   Has had   worsening tachycardia when B12 low.   Good water  intake    Hx of ETOH abuse. No ETOH   Nml TSH and cbc in 02/2024  Relevant past medical, surgical, family and social history reviewed and updated as indicated. Interim medical history since our last visit reviewed. Allergies and medications reviewed and updated. Outpatient Medications Prior to Visit  Medication Sig Dispense Refill   cyanocobalamin  (VITAMIN B12) 1000 MCG/ML injection Inject 1 ml 1000 mcg total into muscle once a week for three weeks then once a month for three months 4 mL 3   EPINEPHrine  0.3 mg/0.3 mL IJ SOAJ injection Inject 0.3 mg into the muscle as needed for anaphylaxis. 1 each 1   folic acid  (FOLVITE ) 1 MG tablet Take 1 tablet (1 mg total) by mouth daily. Take one po every day 90 tablet 1   LUER LOCK SAFETY SYRINGES 25G X 1 3 ML MISC USE TO INJECT B12 INTO THE MUSCLE ONCE  WEEKLY FOR 4 WEEK THEN USE ONCE MONTHLY FOR 3 MONTHS 7 each 0   ondansetron  (ZOFRAN -ODT) 4 MG disintegrating tablet Take 1 tablet (4 mg total) by mouth every 8 (eight) hours as needed for nausea or vomiting. 20 tablet 0   pantoprazole  (PROTONIX ) 20 MG tablet Take 1 tablet (20 mg total) by mouth daily. 30 tablet 0   predniSONE  (STERAPRED UNI-PAK 21 TAB) 10 MG (21) TBPK tablet Take as directed 1 each 0   No facility-administered medications prior to visit.     Per HPI unless specifically indicated in ROS section below Review of Systems  Constitutional:  Positive for fatigue. Negative for fever.  HENT:  Negative for congestion.   Eyes:  Negative for pain.  Respiratory:  Positive for shortness of breath. Negative for cough.   Cardiovascular:  Positive for palpitations. Negative for chest pain and leg swelling.  Gastrointestinal:  Negative for abdominal pain.  Genitourinary:  Negative for dysuria and vaginal bleeding.  Musculoskeletal:  Positive for back pain and myalgias.  Neurological:  Positive for dizziness, weakness and light-headedness. Negative for syncope and headaches.  Psychiatric/Behavioral:  Negative for dysphoric mood.    Objective:  BP 124/76 (BP Location: Left Arm, Patient Position: Sitting, Cuff Size: Normal)  Pulse (!) 105   Temp 98.3 F (36.8 C) (Temporal)   Ht 5' 6 (1.676 m)   Wt 200 lb 6 oz (90.9 kg)   SpO2 96%   BMI 32.34 kg/m   Wt Readings from Last 3 Encounters:  04/11/23 200 lb 6 oz (90.9 kg)  02/03/23 205 lb (93 kg)  01/28/23 201 lb (91.2 kg)      Physical Exam    Results for orders placed or performed in visit on 02/03/23  Urine Culture   Collection Time: 02/03/23 12:59 PM  Result Value Ref Range   MICRO NUMBER: [id number]    SPECIMEN QUALITY: Adequate    Sample Source URINE    STATUS: FINAL    Result: No Growth   Comprehensive metabolic panel   Collection Time: 02/03/23 12:59 PM  Result Value Ref Range   Sodium 137 135 - 145 mEq/L   Potassium  3.9 3.5 - 5.1 mEq/L   Chloride 104 96 - 112 mEq/L   CO2 25 19 - 32 mEq/L   Glucose, Bld 85 70 - 99 mg/dL   BUN 5 (L) 6 - 23 mg/dL   Creatinine, Ser 9.51 0.40 - 1.20 mg/dL   Total Bilirubin 0.4 0.2 - 1.2 mg/dL   Alkaline Phosphatase 113 39 - 117 U/L   AST 11 0 - 37 U/L   ALT 6 0 - 35 U/L   Total Protein 7.1 6.0 - 8.3 g/dL   Albumin 4.1 3.5 - 5.2 g/dL   GFR 889.88 >39.99 mL/min   Calcium  9.0 8.4 - 10.5 mg/dL  Amylase   Collection Time: 02/03/23 12:59 PM  Result Value Ref Range   Amylase 35 27 - 131 U/L  Lipase   Collection Time: 02/03/23 12:59 PM  Result Value Ref Range   Lipase 15.0 11.0 - 59.0 U/L  CBC with Differential   Collection Time: 02/03/23 12:59 PM  Result Value Ref Range   WBC 8.7 4.0 - 10.5 K/uL   RBC 4.60 3.87 - 5.11 Mil/uL   Hemoglobin 14.3 12.0 - 15.0 g/dL   HCT 56.2 63.9 - 53.9 %   MCV 94.8 78.0 - 100.0 fl   MCHC 32.8 30.0 - 36.0 g/dL   RDW 85.7 88.4 - 84.4 %   Platelets 295.0 150.0 - 400.0 K/uL   Neutrophils Relative % 61.3 43.0 - 77.0 %   Lymphocytes Relative 31.6 12.0 - 46.0 %   Monocytes Relative 5.2 3.0 - 12.0 %   Eosinophils Relative 1.1 0.0 - 5.0 %   Basophils Relative 0.8 0.0 - 3.0 %   Neutro Abs 5.3 1.4 - 7.7 K/uL   Lymphs Abs 2.7 0.7 - 4.0 K/uL   Monocytes Absolute 0.4 0.1 - 1.0 K/uL   Eosinophils Absolute 0.1 0.0 - 0.7 K/uL   Basophils Absolute 0.1 0.0 - 0.1 K/uL  TSH   Collection Time: 02/03/23 12:59 PM  Result Value Ref Range   TSH 2.34 0.35 - 5.50 uIU/mL  Urinalysis w microscopic + reflex cultur   Collection Time: 02/03/23 12:59 PM   Specimen: Blood  Result Value Ref Range   Color, Urine DARK YELLOW YELLOW   APPearance TURBID (A) CLEAR   Specific Gravity, Urine 1.026 1.001 - 1.035   pH 5.5 5.0 - 8.0   Glucose, UA NEGATIVE NEGATIVE   Bilirubin Urine NEGATIVE NEGATIVE   Ketones, ur TRACE (A) NEGATIVE   Hgb urine dipstick NEGATIVE NEGATIVE   Protein, ur NEGATIVE NEGATIVE   Nitrites, Initial NEGATIVE NEGATIVE   Leukocyte Esterase  TRACE (A) NEGATIVE   WBC, UA NONE SEEN 0 - 5 /HPF   RBC / HPF 3-10 (A) 0 - 2 /HPF   Squamous Epithelial / HPF NONE SEEN < OR = 5 /HPF   Bacteria, UA NONE SEEN NONE SEEN /HPF   Calcium  Oxalate Crystal MODERATE (A) NONE OR FEW /HPF   Hyaline Cast NONE SEEN NONE SEEN /LPF   Note    Hemoglobin A1c   Collection Time: 02/03/23 12:59 PM  Result Value Ref Range   Hgb A1c MFr Bld 6.1 4.6 - 6.5 %  REFLEXIVE URINE CULTURE   Collection Time: 02/03/23 12:59 PM  Result Value Ref Range   REFLEXIVE URINE CULTURE      Assessment and Plan  There are no diagnoses linked to this encounter.  No follow-ups on file.   Greig Ring, MD

## 2023-04-23 NOTE — Assessment & Plan Note (Signed)
 Acute on chronic issue: Patient states that her current symptoms are very typical for her for B12 deficiency. We will begin with B12 evaluation and will supplement as needed.  She will follow-up with her PCP if symptoms not improving as expected.

## 2023-04-25 ENCOUNTER — Encounter: Payer: Self-pay | Admitting: Family

## 2023-05-08 ENCOUNTER — Encounter: Payer: Self-pay | Admitting: Family

## 2023-05-19 NOTE — Telephone Encounter (Signed)
 Can you please call pt to sort this out?  Has she been taking losartan recently prior to running out or has she noticed her blood pressure is out so she started taking new pills?  We had stopped losartan in 2024 because it was making her hands swell, only reason I'm a tad confused.  Has she seen her cardiologist recently?

## 2023-05-20 NOTE — Telephone Encounter (Signed)
 Spoke with pt. States that she is wanting to restart on Losartan. She was trying to let us know that she does not like amlodipine as it makes her face flush and makes her ankles swell. She started back on losartan and has not had any hand swelling.

## 2023-05-21 MED ORDER — LOSARTAN POTASSIUM 100 MG PO TABS: 100.0000 mg | ORAL_TABLET | Freq: Every day | ORAL | 0 refills | Status: DC

## 2023-05-21 NOTE — Addendum Note (Signed)
 Addended by: Jaynee Eagles C on: 05/21/2023 03:59 PM   Modules accepted: Orders

## 2023-05-21 NOTE — Telephone Encounter (Signed)
 Copied from CRM [id number]. Topic: Clinical - Medication Question >> May 21, 2023  3:17 PM Tammy Decker wrote: Reason for CRM: pt called to follow up on prescription for Losartan 100 mgs. Pt states she spoke with Mardella Layman who assured  her she would have the provider send the prescription into the pharmacy. Please call pt back at [phone]

## 2023-05-21 NOTE — Telephone Encounter (Signed)
 When I spoke to the pt yesterday, I did not assure her anything. I advised her that I would send the message over to Tabitha to address, which I did.

## 2023-05-21 NOTE — Telephone Encounter (Signed)
 Per Susy Frizzle, okay to send in Losartan 100mg  daily.

## 2023-05-21 NOTE — Telephone Encounter (Signed)
 Noted thanks matt for sending in.

## 2023-07-27 ENCOUNTER — Encounter: Payer: Self-pay | Admitting: Family

## 2023-07-27 ENCOUNTER — Other Ambulatory Visit: Payer: Self-pay | Admitting: Family

## 2023-08-28 ENCOUNTER — Encounter: Payer: Self-pay | Admitting: Family

## 2023-08-29 ENCOUNTER — Ambulatory Visit (INDEPENDENT_AMBULATORY_CARE_PROVIDER_SITE_OTHER): Admitting: Family Medicine

## 2023-08-29 ENCOUNTER — Encounter: Payer: Self-pay | Admitting: Family Medicine

## 2023-08-29 VITALS — BP 126/82 | HR 81 | Temp 98.1°F | Ht 66.0 in | Wt 215.1 lb

## 2023-08-29 DIAGNOSIS — N939 Abnormal uterine and vaginal bleeding, unspecified: Secondary | ICD-10-CM | POA: Diagnosis not present

## 2023-08-29 NOTE — Patient Instructions (Signed)
 I put the referral in for a gyn provider  Please let us  know if you don't hear in 1-2 weeks to set that up  If bleeding greatly worsens / pain or other concerns please let us  know in the meantime

## 2023-08-29 NOTE — Assessment & Plan Note (Signed)
 In 53 yo perimenopausal (likey) pt who had uterine ablation years ago and no bleeding in 17 y  Exam is consistent with vaginal bleeding/no other abn  No polyp seen  Pap normal in 2024 / neg HPV  No other vaginal symptoms and declines STD screen   Will refer to gyn for further eval (likely us  and endo bx depending on results) Explained need to assess her endometrium    Call back and Er precautions noted in detail today

## 2023-08-29 NOTE — Progress Notes (Unsigned)
 Subjective:    Patient ID: Tammy Decker, female    DOB: 1970/06/09, 53 y.o.   MRN: [id number]  HPI  Wt Readings from Last 3 Encounters:  08/29/23 215 lb 2 oz (97.6 kg)  04/11/23 200 lb 6 oz (90.9 kg)  02/03/23 205 lb (93 kg)   34.72 kg/m  Vitals:   08/29/23 1058  BP: 126/82  Pulse: 81  Temp: 98.1 F (36.7 C)  SpO2: 98%   52 yo pt of [name] presents with vaginal symptoms    Per message from pt   Hi, hope you're well. My insurance starts July 1, I plan to schedule a check up with you. Last year I had an episode of light pink spotting only noted after urinating/wiping. I started that same pattern early this week but it's darker and heavier. Again, only when I urinate I see it in the toilet and tissue. I have mild cramping. I know nothing about menopause onset. I had an ablation many years ago. Other than last year spotting in had nothing for over 7 years. Do I need to see you for a sick visit or wait for my yearly exam?   Monday- light pink discharge Felt a little crampy then more blood (with wiping) Not enough to wear a pad   Last summer had pink disharge and it went away but also urinary symptoms  No hot flashes or night sweats A little more anxious   (nurse case manager in tough facility)    Sure it is vaginal  No urinary symptoms   No vaginal d/c or itchig or burning  No new partners    Fam history  Sister has endometriosis that is terrible     Gyn history   Uterine ablation - for heavy bleeding  No current gyn  [place]oved [place]    Pap 07/2022  Normal with neg HPV   Patient Active Problem List   Diagnosis Date Noted   Vaginal bleeding, abnormal 08/29/2023   Tachycardia 02/03/2023   Excessive daytime sleepiness 01/09/2023   Dietary folate deficiency anemia 01/09/2023   Alcohol use disorder, mild, in early remission 12/04/2022   Witnessed seizure-like activity (HCC) 12/03/2022   History of syncope 12/03/2022   Allergy to  alpha-gal 11/21/2022   Vitamin B 12 deficiency 11/14/2022   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   Prediabetes 07/31/2022   Seasonal allergic rhinitis due to pollen 07/25/2022   Hepatomegaly 04/23/2022   Cigarette smoker motivated to quit 04/23/2022   Renal cyst, acquired, left 01/22/2022   H/O pyelonephritis 01/22/2022   Proteinuria 01/22/2022   Asthma 01/08/2022   Obesity with body mass index (BMI) of 30.0 to 39.9 01/08/2022   Anxiety and depression 12/07/2021   Menopause 12/07/2021   Hyperglycemia 12/07/2021   H/o Lyme disease 12/07/2021   Chronic GERD    MDD (major depressive disorder), recurrent episode, moderate (HCC) 09/13/2020   Essential hypertension 09/11/2020   Degeneration of lumbar intervertebral disc 09/13/2013   Chronic back pain 09/01/2012   Past Medical History:  Diagnosis Date   Asthma    Complication of anesthesia    OXYGEN SOMETIMES DROPS LOW   GERD (gastroesophageal reflux disease)    Hypertension    Pyelonephritis    Tachycardia 2020   Past Surgical History:  Procedure Laterality Date   BACK SURGERY     CHOLECYSTECTOMY     ESOPHAGOGASTRODUODENOSCOPY (EGD) WITH PROPOFOL  N/A 06/19/2021   Procedure: ESOPHAGOGASTRODUODENOSCOPY (EGD) WITH PROPOFOL ;  Surgeon: Unk Corinn Skiff, MD;  Location: [facility];  Service: Gastroenterology;  Laterality: N/A;   NECK SURGERY     uterine ablation     XI ROBOTIC ASSISTED VENTRAL HERNIA N/A 05/09/2020   Procedure: XI ROBOTIC ASSISTED VENTRAL HERNIA, incisional hernia;  Surgeon: Jordis Laneta FALCON, MD;  Location: [facility] ORS;  Service: General;  Laterality: N/A;   Social History   Tobacco Use   Smoking status: Every Day    Current packs/day: 0.15    Types: Cigarettes    Passive exposure: Past   Smokeless tobacco: Never   Tobacco comments:    down to 3 cigs per day  Vaping Use   Vaping status: Never Used  Substance Use Topics   Alcohol use: Yes    Comment: 1/5 whiskey nightly   Drug use: Never   Family History   Problem Relation Age of Onset   Diabetes Mother    Diabetes Father    Heart attack Father    Ovarian cancer Maternal Grandmother        older dx maybe early 37s   Colon cancer Maternal Grandfather        in his 27's   Early death Paternal Grandfather    Heart attack Paternal Grandfather    Allergies  Allergen Reactions   Ciprofloxacin Anaphylaxis and Hives   Kiwi Extract Anaphylaxis   Metoprolol     Other     Ticks, beef, lamb, pork   Propranolol  Other (See Comments)    fatigue   Sulfa Antibiotics Hives   Current Outpatient Medications on File Prior to Visit  Medication Sig Dispense Refill   cyanocobalamin  (VITAMIN B12) 1000 MCG/ML injection Inject 1 ml 1000 mcg total into muscle once a week for three weeks then once a month for three months 4 mL 3   EPINEPHrine  0.3 mg/0.3 mL IJ SOAJ injection Inject 0.3 mg into the muscle as needed for anaphylaxis. 1 each 1   losartan  (COZAAR ) 100 MG tablet Take 1 tablet by mouth once daily 90 tablet 0   LUER LOCK SAFETY SYRINGES 25G X 1 3 ML MISC USE TO INJECT B12 INTO THE MUSCLE ONCE WEEKLY FOR 4 WEEK THEN USE ONCE MONTHLY FOR 3 MONTHS 7 each 0   ondansetron  (ZOFRAN -ODT) 4 MG disintegrating tablet Take 1 tablet (4 mg total) by mouth every 8 (eight) hours as needed for nausea or vomiting. 20 tablet 0   pantoprazole  (PROTONIX ) 20 MG tablet Take 1 tablet (20 mg total) by mouth daily. 30 tablet 0   No current facility-administered medications on file prior to visit.    Review of Systems     Objective:   Physical Exam        Assessment & Plan:   Problem List Items Addressed This Visit       Other   Vaginal bleeding, abnormal - Primary   In 53 yo perimenopausal (likey) pt who had uterine ablation years ago and no bleeding in 74 y  Exam is consistent with vaginal bleeding/no other abn  No polyp seen  Pap normal in 2024 / neg HPV  No other vaginal symptoms and declines STD screen   Will refer to gyn for further eval (likely us  and  endo bx depending on results) Explained need to assess her endometrium    Call back and Er precautions noted in detail today        Relevant Orders   Ambulatory referral to Gynecology

## 2023-09-14 ENCOUNTER — Encounter: Payer: Self-pay | Admitting: Family

## 2023-09-15 NOTE — Telephone Encounter (Signed)
 Pt needs to be seen acutely. Please schedule

## 2023-09-16 ENCOUNTER — Ambulatory Visit: Payer: Self-pay

## 2023-09-16 NOTE — Telephone Encounter (Signed)
 NOTED

## 2023-09-16 NOTE — Telephone Encounter (Signed)
 FYI Only or Action Required?: FYI only for provider.  Patient was last seen in primary care on 08/29/2023 by Randeen Laine LABOR, MD.  Called Nurse Triage reporting Back Pain.  Symptoms began this past Saturday.  Interventions attempted: OTC medications: Tylenol  and Ibuprofen  and Rest, hydration, or home remedies.  Symptoms are: unchanged.  Triage Disposition: See PCP When Office is Open (Within 3 Days)  Patient/caregiver understands and will follow disposition?: Yes  Copied from CRM [id number]. Topic: Clinical - Red Word Triage >> Sep 16, 2023  9:39 AM Jasmin G wrote: Red Word that prompted transfer to Nurse Triage: Left arm was bleeding, severe back pain. Pt suspects is a kidney stone. Pt. sent a picture through MyChart to dr. [place] for Disposition  [1] MODERATE back pain (e.g., interferes with normal activities) AND [2] present > 3 days  Answer Assessment - Initial Assessment Questions 1. ONSET: When did the pain begin? (e.g., minutes, hours, days)     Started on Saturday 2. LOCATION: Where does it hurt? (upper, mid or lower back)     Right mid back pain 3. SEVERITY: How bad is the pain?  (e.g., Scale 1-10; mild, moderate, or severe)     Pain level is 1 out of 10 4. PATTERN: Is the pain constant? (e.g., yes, no; constant, intermittent)      intermittent 5. RADIATION: Does the pain shoot into your legs or somewhere else?     no 6. CAUSE:  What do you think is causing the back pain?      Concerned for kidney stone 7. BACK OVERUSE:  Any recent lifting of heavy objects, strenuous work or exercise?     no 8. MEDICINES: What have you taken so far for the pain? (e.g., nothing, acetaminophen , NSAIDS)     Advil , tylenol  9. NEUROLOGIC SYMPTOMS: Do you have any weakness, numbness, or problems with bowel/bladder control?     no 10. OTHER SYMPTOMS: Do you have any other symptoms? (e.g., fever, abdomen pain, burning with urination, blood in urine)       Urinary urgency and  frequency 11. PREGNANCY: Is there any chance you are pregnant? When was your last menstrual period?       No  Scheduled tomorrow with PCP for an acute appointment at 9:40 AM. Patient given strict ED precautions.  Protocols used: Back Pain-A-AH

## 2023-09-17 ENCOUNTER — Ambulatory Visit
Admission: RE | Admit: 2023-09-17 | Discharge: 2023-09-17 | Disposition: A | Discharge status: Home / Self Care | Source: Ambulatory Visit | Attending: Family | Admitting: Family

## 2023-09-17 ENCOUNTER — Ambulatory Visit (INDEPENDENT_AMBULATORY_CARE_PROVIDER_SITE_OTHER): Admitting: Family

## 2023-09-17 VITALS — BP 128/86 | HR 96 | Temp 99.0°F | Ht 66.0 in | Wt 221.0 lb

## 2023-09-17 DIAGNOSIS — R509 Fever, unspecified: Secondary | ICD-10-CM | POA: Diagnosis not present

## 2023-09-17 DIAGNOSIS — R109 Unspecified abdominal pain: Secondary | ICD-10-CM

## 2023-09-17 DIAGNOSIS — R82998 Other abnormal findings in urine: Secondary | ICD-10-CM

## 2023-09-17 LAB — COMPREHENSIVE METABOLIC PANEL WITH GFR
ALT: 11 U/L (ref 0–35)
AST: 12 U/L (ref 0–37)
Albumin: 4.3 g/dL (ref 3.5–5.2)
Alkaline Phosphatase: 92 U/L (ref 39–117)
BUN: 10 mg/dL (ref 6–23)
CO2: 27 meq/L (ref 19–32)
Calcium: 9.7 mg/dL (ref 8.4–10.5)
Chloride: 102 meq/L (ref 96–112)
Creatinine, Ser: 0.63 mg/dL (ref 0.40–1.20)
GFR: 102.68 mL/min (ref >60.00)
Glucose, Bld: 102 mg/dL — ABNORMAL HIGH (ref 70–99)
Potassium: 4.6 meq/L (ref 3.5–5.1)
Sodium: 135 meq/L (ref 135–145)
Total Bilirubin: 0.5 mg/dL (ref 0.2–1.2)
Total Protein: 7.6 g/dL (ref 6.0–8.3)

## 2023-09-17 LAB — URINALYSIS, ROUTINE W REFLEX MICROSCOPIC
Bilirubin Urine: NEGATIVE
Hgb urine dipstick: NEGATIVE
Ketones, ur: NEGATIVE
Leukocytes,Ua: NEGATIVE
Nitrite: NEGATIVE
RBC / HPF: NONE SEEN (ref 0–?)
Specific Gravity, Urine: 1.015 (ref 1.000–1.030)
Total Protein, Urine: NEGATIVE
Urine Glucose: NEGATIVE
Urobilinogen, UA: 0.2 (ref 0.0–1.0)
pH: 6 (ref 5.0–8.0)

## 2023-09-17 LAB — CBC WITH DIFFERENTIAL/PLATELET
Basophils Absolute: 0.1 K/uL (ref 0.0–0.1)
Basophils Relative: 0.8 % (ref 0.0–3.0)
Eosinophils Absolute: 0.1 K/uL (ref 0.0–0.7)
Eosinophils Relative: 1.1 % (ref 0.0–5.0)
HCT: 46.6 % — ABNORMAL HIGH (ref 36.0–46.0)
Hemoglobin: 15.4 g/dL — ABNORMAL HIGH (ref 12.0–15.0)
Lymphocytes Relative: 26.2 % (ref 12.0–46.0)
Lymphs Abs: 2.9 K/uL (ref 0.7–4.0)
MCHC: 33.1 g/dL (ref 30.0–36.0)
MCV: 99.8 fl (ref 78.0–100.0)
Monocytes Absolute: 0.7 K/uL (ref 0.1–1.0)
Monocytes Relative: 6.7 % (ref 3.0–12.0)
Neutro Abs: 7.2 K/uL (ref 1.4–7.7)
Neutrophils Relative %: 65.2 % (ref 43.0–77.0)
Platelets: 281 K/uL (ref 150.0–400.0)
RBC: 4.67 Mil/uL (ref 3.87–5.11)
RDW: 13.6 % (ref 11.5–15.5)
WBC: 11.1 K/uL — ABNORMAL HIGH (ref 4.0–10.5)

## 2023-09-17 LAB — POC URINALSYSI DIPSTICK (AUTOMATED)
Bilirubin, UA: NEGATIVE
Blood, UA: NEGATIVE
Glucose, UA: NEGATIVE
Ketones, UA: NEGATIVE
Nitrite, UA: NEGATIVE
Protein, UA: NEGATIVE
Spec Grav, UA: 1.015 (ref 1.010–1.025)
Urobilinogen, UA: 0.2 U/dL
pH, UA: 6 (ref 5.0–8.0)

## 2023-09-17 MED ORDER — CEPHALEXIN 500 MG PO CAPS: 500.0000 mg | ORAL_CAPSULE | Freq: Three times a day (TID) | ORAL | 0 refills | Status: DC

## 2023-09-17 NOTE — Assessment & Plan Note (Signed)
 poct urine dip in office Urine culture ordered pending results antbx sent to pharmacy, pt to take as directed. Encouraged increased water  intake throughout the day. Choosing to treat due to being symptomatic. If no improvement in the next 2 days pt advised to let me know.  Going to cover with cephalexin  pt allergic to bactrim and also flouroquinolones  Stat ct abd pelvis to r/o kidney stone or pyelonephritis Gave pt strict ER precautions

## 2023-09-17 NOTE — Progress Notes (Signed)
 Established Patient Office Visit  Subjective:   Patient ID: Tammy Decker, female    DOB: 09/20/1970  Age: 53 y.o. MRN: [id number]  CC:  Chief Complaint  Patient presents with   Back Pain    Intermittent back pain since 09-13-23. Starts in the right flank area and will sometimes go down into her hip and groin. Gets nauseous when it flares up. Having urinary frequency. Low grade fevers.     HPI: Tammy Decker is a 53 y.o. female presenting on 09/17/2023 for Back Pain (Intermittent back pain since 09-13-23. Starts in the right flank area and will sometimes go down into her hip and groin. Gets nauseous when it flares up. Having urinary frequency. Low grade fevers. )   Five days ago started with right lower flank pain and she states thought might have been her back so she switched out the mattresses but no real improvement. The pain is intermittent hsarp and stabbing and will be 10/10 and the pain will radiate into her right hip and groin. Has noticed increased urinary frequency and urgency, and has noticed low grade fevers at 100 F which was last night. Took tylenol  this am before coming in here. Was 52 F in office today with the tylenol . She did take a leftover tramadol yesterday with no real relief of the pain.   No vaginal discharge and or vaginal itching  She was seen in office with Dr. Randeen a few weeks ago      ROS: Negative unless specifically indicated above in HPI.   Relevant past medical history reviewed and updated as indicated.   Allergies and medications reviewed and updated.   Current Outpatient Medications:    cephALEXin  (KEFLEX ) 500 MG capsule, Take 1 capsule (500 mg total) by mouth 3 (three) times daily for 7 days., Disp: 21 capsule, Rfl: 0   cyanocobalamin  (VITAMIN B12) 1000 MCG/ML injection, Inject 1 ml 1000 mcg total into muscle once a week for three weeks then once a month for three months, Disp: 4 mL, Rfl: 3   EPINEPHrine  0.3 mg/0.3 mL IJ SOAJ  injection, Inject 0.3 mg into the muscle as needed for anaphylaxis., Disp: 1 each, Rfl: 1   losartan  (COZAAR ) 100 MG tablet, Take 1 tablet by mouth once daily, Disp: 90 tablet, Rfl: 0   LUER LOCK SAFETY SYRINGES 25G X 1 3 ML MISC, USE TO INJECT B12 INTO THE MUSCLE ONCE WEEKLY FOR 4 WEEK THEN USE ONCE MONTHLY FOR 3 MONTHS, Disp: 7 each, Rfl: 0   ondansetron  (ZOFRAN -ODT) 4 MG disintegrating tablet, Take 1 tablet (4 mg total) by mouth every 8 (eight) hours as needed for nausea or vomiting., Disp: 20 tablet, Rfl: 0   pantoprazole  (PROTONIX ) 20 MG tablet, Take 1 tablet (20 mg total) by mouth daily., Disp: 30 tablet, Rfl: 0  Allergies  Allergen Reactions   Ciprofloxacin Anaphylaxis and Hives   Kiwi Extract Anaphylaxis   Metoprolol     Other     Ticks, beef, lamb, pork   Propranolol  Other (See Comments)    fatigue   Sulfa Antibiotics Hives    Objective:   BP 128/86 (BP Location: Left Arm, Patient Position: Sitting, Cuff Size: Large)   Pulse 96   Temp 99 F (37.2 C) (Temporal)   Ht 5' 6 (1.676 m)   Wt 221 lb (100.2 kg)   LMP 08/29/2023 (Approximate)   SpO2 96%   BMI 35.67 kg/m    Physical Exam Vitals reviewed.  Constitutional:  General: She is not in acute distress.    Appearance: Normal appearance. She is normal weight. She is not ill-appearing, toxic-appearing or diaphoretic.  Cardiovascular:     Rate and Rhythm: Normal rate.  Pulmonary:     Effort: Pulmonary effort is normal.  Abdominal:     General: Abdomen is flat.     Tenderness: There is no abdominal tenderness. There is no right CVA tenderness or left CVA tenderness.  Neurological:     General: No focal deficit present.     Mental Status: She is alert and oriented to person, place, and time. Mental status is at baseline.     Motor: No weakness.  Psychiatric:        Mood and Affect: Mood normal.        Behavior: Behavior normal.        Thought Content: Thought content normal.        Judgment: Judgment normal.      Assessment & Plan:  Right flank pain Assessment & Plan: poct urine dip in office Urine culture ordered pending results antbx sent to pharmacy, pt to take as directed. Encouraged increased water  intake throughout the day. Choosing to treat due to being symptomatic. If no improvement in the next 2 days pt advised to let me know.  Going to cover with cephalexin  pt allergic to bactrim and also flouroquinolones  Stat ct abd pelvis to r/o kidney stone or pyelonephritis Gave pt strict ER precautions  Orders: -     POCT Urinalysis Dipstick (Automated) -     CT ABDOMEN PELVIS WO CONTRAST; Future -     Urine Culture -     Urinalysis, Routine w reflex microscopic -     Comprehensive metabolic panel with GFR  Leukocytes in urine -     CT ABDOMEN PELVIS WO CONTRAST; Future -     Urine Culture -     Urinalysis, Routine w reflex microscopic -     Cephalexin ; Take 1 capsule (500 mg total) by mouth 3 (three) times daily for 7 days.  Dispense: 21 capsule; Refill: 0  Low grade fever -     CBC with Differential/Platelet     Follow up plan: Return if symptoms worsen or fail to improve.  Ginger Patrick, FNP

## 2023-09-18 ENCOUNTER — Ambulatory Visit: Payer: Self-pay | Admitting: Family

## 2023-09-18 ENCOUNTER — Encounter: Payer: Self-pay | Admitting: Family

## 2023-09-18 DIAGNOSIS — N200 Calculus of kidney: Secondary | ICD-10-CM

## 2023-09-18 DIAGNOSIS — R109 Unspecified abdominal pain: Secondary | ICD-10-CM

## 2023-09-18 DIAGNOSIS — L989 Disorder of the skin and subcutaneous tissue, unspecified: Secondary | ICD-10-CM

## 2023-09-18 DIAGNOSIS — R233 Spontaneous ecchymoses: Secondary | ICD-10-CM

## 2023-09-18 LAB — URINE CULTURE
MICRO NUMBER:: 16706616
Result:: NO GROWTH
SPECIMEN QUALITY:: ADEQUATE

## 2023-09-22 ENCOUNTER — Other Ambulatory Visit (HOSPITAL_COMMUNITY): Payer: Self-pay

## 2023-09-22 MED ORDER — TAMSULOSIN HCL 0.4 MG PO CAPS
0.4000 mg | ORAL_CAPSULE | Freq: Every day | ORAL | 0 refills | Status: DC
  Filled 2023-09-22: qty 30, 30d supply, fill #0

## 2023-09-23 ENCOUNTER — Encounter: Payer: Self-pay | Admitting: Family

## 2023-09-23 NOTE — Progress Notes (Unsigned)
 09/24/2023 8:26 AM   Tammy Decker May 30, 1970 [id number]  Referring provider: Corwin Antu, FNP [address] [place],  [place] [address]  Urological history: 1.  Nephrolithiasis - Non contrast CT (09/2023) punctate bilateral stones   No chief complaint on file.   HPI: Tammy Decker is a 53 y.o. woman who presents today for bilateral nephrolithiasis and right-sided flank pain.  Previous records reviewed.   Non contrast CT (09/2023) punctate bilateral stones, largest being 2 mm.  No hydro or masses.   Her UA and urine culture with her PCP on 09/17/2023 were negative.   UA ***  Lab Results  Component Value Date   WBC 11.1 (H) 09/17/2023   HGB 15.4 (H) 09/17/2023   HCT 46.6 (H) 09/17/2023   MCV 99.8 09/17/2023   PLT 281.0 09/17/2023    Lab Results  Component Value Date   CREATININE 0.63 09/17/2023    Lab Results  Component Value Date   HGBA1C 6.1 02/03/2023    PMH: Past Medical History:  Diagnosis Date   Asthma    Complication of anesthesia    OXYGEN SOMETIMES DROPS LOW   GERD (gastroesophageal reflux disease)    Hypertension    Pyelonephritis    Tachycardia 2020    Surgical History: Past Surgical History:  Procedure Laterality Date   BACK SURGERY     CHOLECYSTECTOMY     ESOPHAGOGASTRODUODENOSCOPY (EGD) WITH PROPOFOL  N/A 06/19/2021   Procedure: ESOPHAGOGASTRODUODENOSCOPY (EGD) WITH PROPOFOL ;  Surgeon: Unk Corinn Skiff, MD;  Location: [facility];  Service: Gastroenterology;  Laterality: N/A;   NECK SURGERY     uterine ablation     XI ROBOTIC ASSISTED VENTRAL HERNIA N/A 05/09/2020   Procedure: XI ROBOTIC ASSISTED VENTRAL HERNIA, incisional hernia;  Surgeon: Jordis Laneta FALCON, MD;  Location: [facility] OR[facility];  Service: General;  Laterality: N/A;    Home Medications:  Allergies as of 09/24/2023       Reactions   Ciprofloxacin Anaphylaxis, Hives   Kiwi Extract Anaphylaxis   Metoprolol     Other    Ticks, beef, lamb, pork    Propranolol  Other (See Comments)   fatigue   Sulfa Antibiotics Hives        Medication List        Accurate as of September 23, 2023  8:26 AM. If you have any questions, ask your nurse or doctor.          cephALEXin  500 MG capsule Commonly known as: KEFLEX  Take 1 capsule (500 mg total) by mouth 3 (three) times daily for 7 days.   cyanocobalamin  1000 MCG/ML injection Commonly known as: VITAMIN B12 Inject 1 ml 1000 mcg total into muscle once a week for three weeks then once a month for three months   EPINEPHrine  0.3 mg/0.3 mL Soaj injection Commonly known as: EPI-PEN Inject 0.3 mg into the muscle as needed for anaphylaxis.   losartan  100 MG tablet Commonly known as: COZAAR  Take 1 tablet by mouth once daily   Luer Lock Safety Syringes 25G X 1 3 ML Misc Generic drug: SYRINGE-NEEDLE (DISP) 3 ML USE TO INJECT B12 INTO THE MUSCLE ONCE WEEKLY FOR 4 WEEK THEN USE ONCE MONTHLY FOR 3 MONTHS   ondansetron  4 MG disintegrating tablet Commonly known as: ZOFRAN -ODT Take 1 tablet (4 mg total) by mouth every 8 (eight) hours as needed for nausea or vomiting.   pantoprazole  20 MG tablet Commonly known as: Protonix  Take 1 tablet (20 mg total) by mouth daily.  tamsulosin  0.4 MG Caps capsule Commonly known as: FLOMAX  Take 1 capsule (0.4 mg total) by mouth daily.        Allergies:  Allergies  Allergen Reactions   Ciprofloxacin Anaphylaxis and Hives   Kiwi Extract Anaphylaxis   Metoprolol     Other     Ticks, beef, lamb, pork   Propranolol  Other (See Comments)    fatigue   Sulfa Antibiotics Hives    Family History: Family History  Problem Relation Age of Onset   Diabetes Mother    Diabetes Father    Heart attack Father    Ovarian cancer Maternal Grandmother        older dx maybe early 65s   Colon cancer Maternal Grandfather        in his 46's   Early death Paternal Grandfather    Heart attack Paternal Grandfather     Social History: See HPI for pertinent social  history  ROS: Pertinent ROS in HPI  Physical Exam: LMP 08/29/2023 (Approximate)   Constitutional:  Well nourished. Alert and oriented, No acute distress. HEENT: [place] AT, moist mucus membranes.  Trachea midline, no masses. Cardiovascular: No clubbing, cyanosis, or edema. Respiratory: Normal respiratory effort, no increased work of breathing. GU: No CVA tenderness.  No bladder fullness or masses.  Recession of labia minora, dry, pale vulvar vaginal mucosa and loss of mucosal ridges and folds.  Normal urethral meatus, no lesions, no prolapse, no discharge.   No urethral masses, tenderness and/or tenderness. No bladder fullness, tenderness or masses. *** vagina mucosa, *** estrogen effect, no discharge, no lesions, *** pelvic support, *** cystocele and *** rectocele noted.  No cervical motion tenderness.  Uterus is freely mobile and non-fixed.  No adnexal/parametria masses or tenderness noted.  Anus and perineum are without rashes or lesions.   ***  Neurologic: Grossly intact, no focal deficits, moving all 4 extremities. Psychiatric: Normal mood and affect.    Laboratory Data: See EPIC and HPI  I have reviewed the labs.   Pertinent Imaging: CLINICAL DATA:  Right flank pain, stone suspected   EXAM: CT ABDOMEN AND PELVIS WITHOUT CONTRAST   TECHNIQUE: Multidetector CT imaging of the abdomen and pelvis was performed following the standard protocol without IV contrast.   RADIATION DOSE REDUCTION: This exam was performed according to the departmental dose-optimization program which includes automated exposure control, adjustment of the mA and/or kV according to patient size and/or use of iterative reconstruction technique.   COMPARISON:  04/19/2022   FINDINGS: Lower chest: No acute abnormality.   Hepatobiliary: No solid liver abnormality is seen. Hepatic steatosis. Status post cholecystectomy. No biliary ductal dilatation.   Pancreas: Unremarkable. No pancreatic ductal dilatation  or surrounding inflammatory changes.   Spleen: Normal in size without significant abnormality.   Adrenals/Urinary Tract: Adrenal glands are unremarkable. Multiple punctuate nonobstructive bilateral renal calculi. No ureteral calculi or hydronephrosis. Multiple phleboliths in the low pelvis which do not reflect urinary tract calculi. Bladder is unremarkable.   Stomach/Bowel: Stomach is within normal limits. Appendix appears normal. No evidence of bowel wall thickening, distention, or inflammatory changes.   Vascular/Lymphatic: Scattered aortic atherosclerosis. No enlarged abdominal or pelvic lymph nodes.   Reproductive: No mass or other significant abnormality.   Other: No abdominal wall hernia or abnormality. No ascites.   Musculoskeletal: No acute or significant osseous findings.   IMPRESSION: 1. Multiple punctuate nonobstructive bilateral renal calculi. No ureteral calculi or hydronephrosis. 2. Hepatic steatosis. 3. Status post cholecystectomy.   Aortic Atherosclerosis (ICD10-I70.0).  Electronically Signed   By: Marolyn JONETTA Jaksch M.D.   On: 09/17/2023 13:34  I have independently reviewed the films.  See HPI.   Assessment & Plan:  ***  1. Right flank pain - Explained that punctate nonobstructing stones do not cause pain - UA ***  No follow-ups on file.  These notes generated with voice recognition software. I apologize for typographical errors.  CLOTILDA HELON RIGGERS  [facility] [address]  [address] [place], [place] [address] [phone]

## 2023-09-24 ENCOUNTER — Encounter: Payer: Self-pay | Admitting: Urology

## 2023-09-24 ENCOUNTER — Ambulatory Visit (INDEPENDENT_AMBULATORY_CARE_PROVIDER_SITE_OTHER): Admitting: Urology

## 2023-09-24 ENCOUNTER — Other Ambulatory Visit (INDEPENDENT_AMBULATORY_CARE_PROVIDER_SITE_OTHER)

## 2023-09-24 VITALS — BP 129/87 | HR 76 | Temp 98.3°F | Ht 67.0 in | Wt 198.0 lb

## 2023-09-24 DIAGNOSIS — R233 Spontaneous ecchymoses: Secondary | ICD-10-CM

## 2023-09-24 DIAGNOSIS — R310 Gross hematuria: Secondary | ICD-10-CM | POA: Diagnosis not present

## 2023-09-24 DIAGNOSIS — R109 Unspecified abdominal pain: Secondary | ICD-10-CM

## 2023-09-24 LAB — URINALYSIS, COMPLETE
Bilirubin, UA: NEGATIVE
Glucose, UA: NEGATIVE
Ketones, UA: NEGATIVE
Leukocytes,UA: NEGATIVE
Nitrite, UA: NEGATIVE
Protein,UA: NEGATIVE
Specific Gravity, UA: 1.025 (ref 1.005–1.030)
Urobilinogen, Ur: 0.2 mg/dL (ref 0.2–1.0)
pH, UA: 6 (ref 5.0–7.5)

## 2023-09-24 LAB — MICROSCOPIC EXAMINATION: Epithelial Cells (non renal): >10 /HPF — AB (ref 0–10)

## 2023-09-24 MED ORDER — OXYCODONE-ACETAMINOPHEN 5-325 MG PO TABS: 1.0000 | ORAL_TABLET | ORAL | 0 refills | Status: DC | PRN

## 2023-09-24 MED ORDER — CEFUROXIME AXETIL 500 MG PO TABS: 500.0000 mg | ORAL_TABLET | Freq: Two times a day (BID) | ORAL | 0 refills | Status: DC

## 2023-09-24 MED ORDER — ONDANSETRON 4 MG PO TBDP: 4.0000 mg | ORAL_TABLET | Freq: Three times a day (TID) | ORAL | 0 refills | Status: DC | PRN

## 2023-09-24 NOTE — Patient Instructions (Addendum)
    [facility] [address]  [phone]  Please arrive by 9:30 AM.  Please have no solid foods after midnight.  You may have liquids.

## 2023-09-24 NOTE — Telephone Encounter (Signed)
 Good afternoon Dr. Corey, I have this pt that has started with new skin conditions, she says all of a sudden her arm will be bleeding and then she notices a red spot that looks like capillaries have broken. Since the first few episodes now she is noticing more bullous/blister appearance with surrounding erythema. Does not itch, bleeds prior to onset intermittently.   I have also referred her to hematology and doing some tests for bleeding disorders but this most recent image she sent me in [facility] 7/22 looks more vesicular. She has an appt with you in november however given that this seems to be worsening quickly, do you have anything more acutely?

## 2023-09-25 ENCOUNTER — Ambulatory Visit: Payer: Self-pay | Admitting: Urology

## 2023-09-25 ENCOUNTER — Encounter: Payer: Self-pay | Admitting: Family

## 2023-09-25 ENCOUNTER — Ambulatory Visit
Admission: RE | Admit: 2023-09-25 | Discharge: 2023-09-25 | Disposition: A | Discharge status: Home / Self Care | Source: Ambulatory Visit | Attending: Urology | Admitting: Urology

## 2023-09-25 DIAGNOSIS — R109 Unspecified abdominal pain: Secondary | ICD-10-CM | POA: Insufficient documentation

## 2023-09-25 DIAGNOSIS — R310 Gross hematuria: Secondary | ICD-10-CM | POA: Insufficient documentation

## 2023-09-25 MED ORDER — SODIUM CHLORIDE 0.9 % IV SOLN: INTRAVENOUS | Status: DC

## 2023-09-25 MED ORDER — IOHEXOL 300 MG/ML  SOLN
125.0000 mL | Freq: Once | INTRAMUSCULAR | Status: AC | PRN
  Administered 2023-09-25: 125 mL via INTRAVENOUS

## 2023-09-29 LAB — VON WILLEBRAND PANEL
Factor-VIII Activity: 39 %{normal} — ABNORMAL LOW (ref 50–180)
Ristocetin Co-Factor: 52 %{normal} (ref 42–200)
Von Willebrand Antigen, Plasma: 80 % (ref 50–217)
aPTT: 33 s — ABNORMAL HIGH (ref 23–32)

## 2023-09-29 LAB — CULTURE, URINE COMPREHENSIVE

## 2023-09-30 ENCOUNTER — Ambulatory Visit: Payer: Self-pay | Admitting: Family

## 2023-09-30 DIAGNOSIS — Z1402 Symptomatic hemophilia A carrier: Secondary | ICD-10-CM | POA: Insufficient documentation

## 2023-10-01 ENCOUNTER — Other Ambulatory Visit (HOSPITAL_COMMUNITY): Payer: Self-pay

## 2023-10-04 ENCOUNTER — Other Ambulatory Visit: Payer: Self-pay | Admitting: Family

## 2023-10-06 ENCOUNTER — Other Ambulatory Visit: Payer: Self-pay | Admitting: *Deleted

## 2023-10-06 MED ORDER — LOSARTAN POTASSIUM 100 MG PO TABS: 100.0000 mg | ORAL_TABLET | Freq: Every day | ORAL | 0 refills | Status: DC

## 2023-10-08 ENCOUNTER — Ambulatory Visit (INDEPENDENT_AMBULATORY_CARE_PROVIDER_SITE_OTHER): Admitting: Dermatology

## 2023-10-08 ENCOUNTER — Encounter: Payer: Self-pay | Admitting: Dermatology

## 2023-10-08 VITALS — BP 132/81

## 2023-10-08 DIAGNOSIS — R238 Other skin changes: Secondary | ICD-10-CM

## 2023-10-08 DIAGNOSIS — D692 Other nonthrombocytopenic purpura: Secondary | ICD-10-CM | POA: Diagnosis not present

## 2023-10-08 DIAGNOSIS — I872 Venous insufficiency (chronic) (peripheral): Secondary | ICD-10-CM | POA: Diagnosis not present

## 2023-10-08 DIAGNOSIS — L729 Follicular cyst of the skin and subcutaneous tissue, unspecified: Secondary | ICD-10-CM

## 2023-10-08 DIAGNOSIS — L989 Disorder of the skin and subcutaneous tissue, unspecified: Secondary | ICD-10-CM

## 2023-10-08 MED ORDER — CLOBETASOL PROPIONATE 0.05 % EX OINT: 1.0000 | TOPICAL_OINTMENT | Freq: Two times a day (BID) | CUTANEOUS | 1 refills | Status: AC

## 2023-10-08 NOTE — Patient Instructions (Addendum)
 Date: Wed Oct 08 2023  Hello Tammy Decker,  Thank you for visiting today. Here is a summary of the key instructions:  - Medications:   - Apply DermMend cream to arms and legs twice daily (morning and night)   - Use clobetasol  ointment on leg rash when it flares up   - Apply a thin layer twice daily for 2 weeks, then stop for 2 weeks   - Repeat this cycle until rash improves  - Skin Care:   - Wear sunscreen daily as part of your moisturizing routine   - Apply sunscreen after DermMend cream  - Follow-up:   - Attend hematology appointment tomorrow   - Schedule a full skin cancer screening in a couple of weeks  - Other Instructions:   - Wear compression stockings during the day to improve circulation   - Elevate legs when resting at home   - Minimize salt intake   - Message through MyChart for any questions  Please reach out if you have any questions or concerns.  Warm regards,  Dr. Delon Lenis [facility]        Important Information  Due to recent changes in healthcare laws, you may see results of your pathology and/or laboratory studies on MyChart before the doctors have had a chance to review them. We understand that in some cases there may be results that are confusing or concerning to you. Please understand that not all results are received at the same time and often the doctors may need to interpret multiple results in order to provide you with the best plan of care or course of treatment. Therefore, we ask that you please give us  2 business days to thoroughly review all your results before contacting the office for clarification. Should we see a critical lab result, you will be contacted sooner.   If You Need Anything After Your Visit  If you have any questions or concerns for your doctor, please call our main line at [phone] If no one answers, please leave a voicemail as directed and we will return your call as soon as possible. Messages left after 4 pm will be  answered the following business day.   You may also send us  a message via MyChart. We typically respond to MyChart messages within 1-2 business days.  For prescription refills, please ask your pharmacy to contact our office. Our fax number is [phone].  If you have an urgent issue when the clinic is closed that cannot wait until the next business day, you can page your doctor at the number below.    Please note that while we do our best to be available for urgent issues outside of office hours, we are not available 24/7.   If you have an urgent issue and are unable to reach us , you may choose to seek medical care at your doctor's office, retail clinic, urgent care center, or emergency room.  If you have a medical emergency, please immediately call 911 or go to the emergency department. In the event of inclement weather, please call our main line at [phone] for an update on the status of any delays or closures.  Dermatology Medication Tips: Please keep the boxes that topical medications come in in order to help keep track of the instructions about where and how to use these. Pharmacies typically print the medication instructions only on the boxes and not directly on the medication tubes.   If your medication is too expensive, please contact our office at  [phone] or send us  a message through MyChart.   We are unable to tell what your co-pay for medications will be in advance as this is different depending on your insurance coverage. However, we may be able to find a substitute medication at lower cost or fill out paperwork to get insurance to cover a needed medication.   If a prior authorization is required to get your medication covered by your insurance company, please allow us  1-2 business days to complete this process.  Drug prices often vary depending on where the prescription is filled and some pharmacies may offer cheaper prices.  The website www.goodrx.com contains  coupons for medications through different pharmacies. The prices here do not account for what the cost may be with help from insurance (it may be cheaper with your insurance), but the website can give you the price if you did not use any insurance.  - You can print the associated coupon and take it with your prescription to the pharmacy.  - You may also stop by our office during regular business hours and pick up a GoodRx coupon card.  - If you need your prescription sent electronically to a different pharmacy, notify our office through [facility] or by phone at [phone]

## 2023-10-08 NOTE — Progress Notes (Unsigned)
 New Patient Visit   Subjective  Tammy Decker is a 53 y.o. female who presents for the following:  Blisters that just pop up. This started a few weeks ago. Sometimes they bleed, other times they are just blisters. There are no symptoms associated with them. She does not have any active areas today but does have some resolving areas of her left forearm. Since this started, she has had some vaginal bleeding and blood in her urine. She did have some kidney stones at the same time. She had an ablation 15 years ago and she does not have a menstrual cycle. 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 She has an appointment with a hematologist tomorrow. Her PCP wanted her to keep the dermatology appointment also. She was diagnosed with Alpha Gal about 4 years ago.  She also has a rash of her lower legs that has come and gone for the last year. She does have clobetasol  which helps calm it done.  The following portions of the chart were reviewed this encounter and updated as appropriate: medications, allergies, medical history  Review of Systems:  No other skin or systemic complaints except as noted in HPI or Assessment and Plan.  Objective  Well appearing patient in no apparent distress; mood and affect are within normal limits.   A focused examination was performed of the following areas: arms, legs  Relevant exam findings are noted in the Assessment and Plan.  Component     Latest Ref Rng 09/17/2023 09/24/2023  WBC     4.0 - 10.5 K/uL 11.1 (H)    RBC     3.87 - 5.11 Mil/uL 4.67    Hemoglobin     12.0 - 15.0 g/dL 84.5 (H)    HCT     63.9 - 46.0 % 46.6 (H)    MCV     78.0 - 100.0 fl 99.8    MCHC     30.0 - 36.0 g/dL 66.8    RDW     88.4 - 15.5 % 13.6    Platelets     150.0 - 400.0 K/uL 281.0    Neutrophils     43.0 - 77.0 % 65.2    Lymphocytes     12.0 - 46.0 % 26.2    Monocytes Relative     3.0 - 12.0 % 6.7    Eosinophil     0.0 - 5.0 % 1.1    Basophil     0.0 -  3.0 % 0.8    NEUT#     1.4 - 7.7 K/uL 7.2    Lymphs Abs     0.7 - 4.0 K/uL 2.9    Monocyte #     0.1 - 1.0 K/uL 0.7    Eosinophils Absolute     0.0 - 0.7 K/uL 0.1    Basophils Absolute     0.0 - 0.1 K/uL 0.1    Color, Urine     Yellow;Lt. Yellow;Straw;Dark Yellow;Amber;Green;Red;Brown  YELLOW    Appearance     Clear;Turbid;Slightly Cloudy;Cloudy  CLEAR    Specific Gravity, Urine     1.000 - 1.030  1.015    pH     5.0 - 8.0  6.0    Total Protein, Urine-UPE24     Negative  NEGATIVE    Urine Glucose     Negative  NEGATIVE    Ketones, ur     Negative  NEGATIVE    Bilirubin Urine  Negative  NEGATIVE    Hgb urine dipstick     Negative  NEGATIVE    Urobilinogen, UA     0.0 - 1.0  0.2    Urobilinogen, UA      0.2    Leukocytes,Ua     Negative  NEGATIVE    Nitrite     Negative  NEGATIVE    WBC, UA     0 - 5 /hpf 0-2/hpf  0-5   RBC / HPF     0-2/hpf  none seen    Squamous Epithelial / HPF     Rare(0-4/hpf)  Few(5-10/hpf) !    Amorphous     None;Present  Present !    Sodium     135 - 145 mEq/L 135    Potassium     3.5 - 5.1 mEq/L 4.6    Chloride     96 - 112 mEq/L 102    CO2     19 - 32 mEq/L 27    Glucose     70 - 99 mg/dL 897 (H)    BUN     6 - 23 mg/dL 10    Creatinine     9.59 - 1.20 mg/dL 9.36    Total Bilirubin     0.2 - 1.2 mg/dL 0.5    Alkaline Phosphatase     39 - 117 U/L 92    AST     0 - 37 U/L 12    ALT     0 - 35 U/L 11    Total Protein     6.0 - 8.3 g/dL 7.6    Albumin     3.5 - 5.2 g/dL 4.3    GFR     >39.99 mL/min 102.68    Calcium      8.4 - 10.5 mg/dL 9.7    Specific Gravity, UA     1.005 - 1.030  1.015  1.025   pH, UA     5.0 - 7.5  6.0  6.0   Color, UA     Yellow  amber  Yellow   Appearance Ur     Clear   Clear   Leukocytes,UA     Negative  Trace !  Negative   Protein,UA     Negative/Trace  Negative  Negative   Glucose, UA     Negative   Negative   Ketones, UA     Negative  negative  Negative   RBC, UA     Negative   negative  3+ !   Bilirubin, UA     Negative  negative  Negative   Urobilinogen, Ur     0.2 - 1.0 mg/dL  0.2   Nitrite, UA     Negative  negative  Negative   Microscopic Examination  See below:   Clarity, UA clear    Glucose     Negative  Negative    MICRO NUMBER: [id number]    SPECIMEN QUALITY: Adequate    Sample Source URINE    STATUS: FINAL    Result No Growth    RBC, Urine     0 - 2 /hpf  11-30 !   Epithelial Cells (non renal)     0 - 10 /hpf  >10 !   Mucus, UA     Not Estab.   Present !   Bacteria, UA     None seen/Few   Few   Factor-VIII Activity     50 -  180 % normal  39 (L)   Von Willebrand Antigen, Plasma     50 - 217 %  80   Ristocetin Co-Factor     42 - 200 % normal  52   APTT     23 - 32 sec  33 (H)   Urine Culture, Comprehensive  Final report   Organism ID, Bacteria  Comment     Legend: (H) High ! 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 Recent episodes of vaginal bleeding and hematuria support this assessment. No history of congenital bleeding disorders or anticoagulant medications. History of Lyme disease exposure may be relevant.  - Plan:    Follow up with hematology appointment scheduled for tomorrow    Await hematology evaluation results and recommendations for further management    Monitor for any new bleeding episodes or worsening symptoms  2. Actinic Purpura - Assessment: Recurrent bruising on arms consistent with actinic purpura. Age-related skin thinning and chronic sun exposure leading to fragile blood vessels and easy bruising. Underlying possible acquired hemophilia may contribute to extent and frequency of bruising episodes. - Plan:    Apply DermMend cream to affected areas morning  and night    Educate on daily sunscreen use as part of regular skincare routine    Provide sunscreen samples for patient to try    Consider oral Arnica or vitamin K supplements to help with bruise resolution  3. Stasis Dermatitis - Assessment: Chronic rash on leg present for approximately one year consistent with stasis dermatitis. Characterized by vascular changes and intermittent inflammation. Condition exacerbated by prolonged standing leading to venous pooling and increased hydrostatic pressure. - Plan:    Prescribe clobetasol  ointment for flare-ups, apply thin layer twice daily for 2 weeks when flared, then 2 weeks off    Recommend compression stockings for use during work hours    Educate on leg elevation when at rest and minimizing salt intake    Advise on potential for varicose veins and importance of managing condition  4. Skin Lesions of Concern - Assessment: Several skin lesions of concern including long-standing cyst, facial lesion, and new reddish-brown mole that appeared about a year ago. Lesions require evaluation for potential skin cancer or other dermatological conditions. - Plan:    Schedule full body skin cancer screening in the next couple of weeks    Evaluate mentioned lesions during upcoming appointment    Assess for any suspicious features or changes in existing moles    Return for TBSE (first available).  I, Roseline Hutchinson, CMA, am acting as scribe for [employer], DO .   Documentation: I have reviewed the above documentation for accuracy and completeness, and I agree with the above.  Delon Lenis, DO

## 2023-10-09 ENCOUNTER — Encounter: Payer: Self-pay | Admitting: Family

## 2023-10-09 ENCOUNTER — Inpatient Hospital Stay

## 2023-10-09 ENCOUNTER — Encounter: Payer: Self-pay | Admitting: Physician Assistant

## 2023-10-09 ENCOUNTER — Inpatient Hospital Stay: Attending: Physician Assistant | Admitting: Physician Assistant

## 2023-10-09 VITALS — BP 143/94 | HR 84 | Temp 98.7°F | Resp 18 | Ht 67.0 in | Wt 226.9 lb

## 2023-10-09 DIAGNOSIS — R000 Tachycardia, unspecified: Secondary | ICD-10-CM

## 2023-10-09 DIAGNOSIS — R233 Spontaneous ecchymoses: Secondary | ICD-10-CM | POA: Diagnosis present

## 2023-10-09 DIAGNOSIS — Z8041 Family history of malignant neoplasm of ovary: Secondary | ICD-10-CM | POA: Insufficient documentation

## 2023-10-09 DIAGNOSIS — I10 Essential (primary) hypertension: Secondary | ICD-10-CM

## 2023-10-09 DIAGNOSIS — F17210 Nicotine dependence, cigarettes, uncomplicated: Secondary | ICD-10-CM | POA: Diagnosis not present

## 2023-10-09 DIAGNOSIS — N939 Abnormal uterine and vaginal bleeding, unspecified: Secondary | ICD-10-CM | POA: Insufficient documentation

## 2023-10-09 DIAGNOSIS — Z800 Family history of malignant neoplasm of digestive organs: Secondary | ICD-10-CM | POA: Diagnosis not present

## 2023-10-09 LAB — CBC WITH DIFFERENTIAL (CANCER CENTER ONLY)
Abs Immature Granulocytes: 0.02 K/uL (ref 0.00–0.07)
Basophils Absolute: 0.1 K/uL (ref 0.0–0.1)
Basophils Relative: 1 %
Eosinophils Absolute: 0.1 K/uL (ref 0.0–0.5)
Eosinophils Relative: 1 %
HCT: 44.6 % (ref 36.0–46.0)
Hemoglobin: 15.3 g/dL — ABNORMAL HIGH (ref 12.0–15.0)
Immature Granulocytes: 0 %
Lymphocytes Relative: 31 %
Lymphs Abs: 3.1 K/uL (ref 0.7–4.0)
MCH: 33.8 pg (ref 26.0–34.0)
MCHC: 34.3 g/dL (ref 30.0–36.0)
MCV: 98.7 fL (ref 80.0–100.0)
Monocytes Absolute: 0.6 K/uL (ref 0.1–1.0)
Monocytes Relative: 6 %
Neutro Abs: 6 K/uL (ref 1.7–7.7)
Neutrophils Relative %: 61 %
Platelet Count: 253 K/uL (ref 150–400)
RBC: 4.52 MIL/uL (ref 3.87–5.11)
RDW: 13.2 % (ref 11.5–15.5)
WBC Count: 9.8 K/uL (ref 4.0–10.5)
nRBC: 0 % (ref 0.0–0.2)

## 2023-10-09 LAB — CMP (CANCER CENTER ONLY)
ALT: 11 U/L (ref 0–44)
AST: 18 U/L (ref 15–41)
Albumin: 4.1 g/dL (ref 3.5–5.0)
Alkaline Phosphatase: 102 U/L (ref 38–126)
Anion gap: 11 (ref 5–15)
BUN: 7 mg/dL (ref 6–20)
CO2: 22 mmol/L (ref 22–32)
Calcium: 9.3 mg/dL (ref 8.9–10.3)
Chloride: 103 mmol/L (ref 98–111)
Creatinine: 0.59 mg/dL (ref 0.44–1.00)
GFR, Estimated: >60 mL/min (ref >60)
Glucose, Bld: 109 mg/dL — ABNORMAL HIGH (ref 70–99)
Potassium: 4.2 mmol/L (ref 3.5–5.1)
Sodium: 136 mmol/L (ref 135–145)
Total Bilirubin: 0.4 mg/dL (ref 0.0–1.2)
Total Protein: 7.3 g/dL (ref 6.5–8.1)

## 2023-10-09 LAB — PROTIME-INR
INR: 1 (ref 0.8–1.2)
Prothrombin Time: 13.8 s (ref 11.4–15.2)

## 2023-10-09 LAB — TECHNOLOGIST SMEAR REVIEW

## 2023-10-09 LAB — ABO/RH: ABO/RH(D): O POS

## 2023-10-09 LAB — APTT: aPTT: 30 s (ref 24–36)

## 2023-10-09 NOTE — Progress Notes (Signed)
 [facility]   INITIAL CONSULT NOTE  Patient Care Team: Corwin Antu, FNP as PCP - General (Family Medicine) Ermalinda Lenn [place], [place] as Social Worker   CHIEF COMPLAINTS/PURPOSE OF CONSULTATION:  Easy bruising  HISTORY OF PRESENTING ILLNESS:  Tammy Decker 53 y.o. female with medical history significant for GERD and hypertension presents to the clinic for evaluation of easy bruising. She is unaccompanied for this visit.   Tammy Decker reports having chronic history of increased bleeding risk including requiring a blood transfusion after each of her vaginal deliveries and excessive bleeding with prior surgeries. She noted several weeks ago having bruising with swelling in her joints including her elbows, knees and ankles. This leads to stiffness and pain in the joints. She denies any injury or falls that causes the symptoms. Additionally, she has noticed gum bleeding, blood on the toilet paper when she wipes after urination and recent episode of vaginal bleeding lasting 9 days. She adds that she underwent uterine ablation 15 years ago and stopped having a menstrual cycle until recent episode of vaginal bleeding. She underwent vaginal examination by PCP without any gross abnormalities identified.   She underwent CT imaging on 09/17/2023 and 09/25/2023 that showed bilateral nonobstructing renal stones and hepatic steatosis. UA showed microscopic RBC present without infection. Laboratory evaluation on 09/24/18-25 showed decreased in Factor VIII activity measuring 39% (range 50-180%) and mild elevated PTT measuring 33 (range 23-32 sec).  Tammy Decker does have pronounced fatigue which impacts her ADLs. She is currently on weekly B12 supplementation. She denies any appetite changes or weight loss. She denies nausea, vomiting or bowel habit changes. She denies fevers, chills, sweats, shortness of breath, chest pain or cough. She has no other complaints. Rest of the ROS is below.    MEDICAL HISTORY:  Past Medical History:  Diagnosis Date   Asthma    Complication of anesthesia    OXYGEN SOMETIMES DROPS LOW   GERD (gastroesophageal reflux disease)    Hypertension    Pyelonephritis    Tachycardia 2020    SURGICAL HISTORY: Past Surgical History:  Procedure Laterality Date   BACK SURGERY     CHOLECYSTECTOMY     ESOPHAGOGASTRODUODENOSCOPY (EGD) WITH PROPOFOL  N/A 06/19/2021   Procedure: ESOPHAGOGASTRODUODENOSCOPY (EGD) WITH PROPOFOL ;  Surgeon: Unk Corinn Skiff, MD;  Location: [facility] ENDOSCOPY;  Service: Gastroenterology;  Laterality: N/A;   NECK SURGERY     uterine ablation     XI ROBOTIC ASSISTED VENTRAL HERNIA N/A 05/09/2020   Procedure: XI ROBOTIC ASSISTED VENTRAL HERNIA, incisional hernia;  Surgeon: Jordis Laneta FALCON, MD;  Location: [facility] ORS;  Service: General;  Laterality: N/A;    SOCIAL HISTORY: Social History   Socioeconomic History   Marital status: Married    Spouse name: Not on file   Number of children: Not on file   Years of education: Not on file   Highest education level: Not on file  Occupational History   Not on file  Tobacco Use   Smoking status: Every Day    Current packs/day: 0.15    Types: Cigarettes    Passive exposure: Past   Smokeless tobacco: Never   Tobacco comments:    down to 4 cigs per day  Vaping Use   Vaping status: Never Used  Substance and Sexual Activity   Alcohol use: Yes    Comment: 1/5 whiskey nightly   Drug use: Never   Sexual activity: Yes    Partners: Male    Birth control/protection: None, Post-menopausal  Other Topics Concern   Not on file  Social History Narrative   Not on file   Social Drivers of Health   Financial Resource Strain: Low Risk  (10/09/2023)   Overall Financial Resource Strain (CARDIA)    Difficulty of Paying Living Expenses: Not very hard  Food Insecurity: No Food Insecurity (10/09/2023)   Hunger Vital Sign    Worried About Running Out of Food in the Last Year: Never true    Ran Out of  Food in the Last Year: Never true  Transportation Needs: No Transportation Needs (10/09/2023)   PRAPARE - Administrator, Civil Service (Medical): No    Lack of Transportation (Non-Medical): No  Physical Activity: Sufficiently Active (10/09/2023)   Exercise Vital Sign    Days of Exercise per Week: 5 days    Minutes of Exercise per Session: 50 min  Stress: No Stress Concern Present (10/09/2023)   [employer] of Occupational Health - Occupational Stress Questionnaire    Feeling of Stress: Not at all  Social Connections: Not on file  Intimate Partner Violence: Not At Risk (10/09/2023)   Humiliation, Afraid, Rape, and Kick questionnaire    Fear of Current or Ex-Partner: No    Emotionally Abused: No    Physically Abused: No    Sexually Abused: No    FAMILY HISTORY: Family History  Problem Relation Age of Onset   Diabetes Mother    Diabetes Father    Heart attack Father    Ovarian cancer Maternal Grandmother        older dx maybe early 39s   Colon cancer Maternal Grandfather        in his 73's   Early death Paternal Grandfather    Heart attack Paternal Grandfather     ALLERGIES:  is allergic to ciprofloxacin, kiwi extract, metoprolol , other, propranolol , and sulfa antibiotics.  MEDICATIONS:  Current Outpatient Medications  Medication Sig Dispense Refill   cyanocobalamin  (VITAMIN B12) 1000 MCG/ML injection Inject 1 ml 1000 mcg total into muscle once a week for three weeks then once a month for three months 4 mL 3   EPINEPHrine  0.3 mg/0.3 mL IJ SOAJ injection Inject 0.3 mg into the muscle as needed for anaphylaxis. 1 each 1   losartan  (COZAAR ) 100 MG tablet Take 1 tablet (100 mg total) by mouth daily. 90 tablet 0   LUER LOCK SAFETY SYRINGES 25G X 1 3 ML MISC USE TO INJECT B12 INTO THE MUSCLE ONCE WEEKLY FOR 4 WEEK THEN USE ONCE MONTHLY FOR 3 MONTHS 7 each 0   ondansetron  (ZOFRAN -ODT) 4 MG disintegrating tablet Take 1 tablet (4 mg total) by mouth every 8 (eight) hours as  needed for nausea or vomiting. 20 tablet 0   pantoprazole  (PROTONIX ) 20 MG tablet Take 1 tablet (20 mg total) by mouth daily. 30 tablet 0   cefUROXime  (CEFTIN ) 500 MG tablet Take 1 tablet (500 mg total) by mouth 2 (two) times daily with a meal. (Patient not taking: Reported on 10/09/2023) 14 tablet 0   clobetasol  ointment (TEMOVATE ) 0.05 % Apply 1 Application topically 2 (two) times daily. twice daily to lower legs x 2 weeks on and 2 weeks off until clear when condition is flared. (Patient not taking: Reported on 10/09/2023) 60 g 1   oxyCODONE -acetaminophen  (PERCOCET) 5-325 MG tablet Take 1 tablet by mouth every 4 (four) hours as needed for severe pain (pain score 7-10). (Patient not taking: Reported on 10/09/2023) 5 tablet 0   tamsulosin  (FLOMAX ) 0.4 MG  CAPS capsule Take 1 capsule (0.4 mg total) by mouth daily. (Patient not taking: Reported on 10/09/2023) 30 capsule 0   No current facility-administered medications for this visit.    REVIEW OF SYSTEMS:   Constitutional: ( - ) fevers, ( - )  chills , ( - ) night sweats Eyes: ( - ) blurriness of vision, ( - ) double vision, ( - ) watery eyes Ears, nose, mouth, throat, and face: ( - ) mucositis, ( - ) sore throat Respiratory: ( - ) cough, ( - ) dyspnea, ( - ) wheezes Cardiovascular: ( - ) palpitation, ( - ) chest discomfort, ( - ) lower extremity swelling Gastrointestinal:  ( - ) nausea, ( - ) heartburn, ( - ) change in bowel habits Skin: ( - ) abnormal skin rashes Lymphatics: ( - ) new lymphadenopathy, ( + ) easy bruising Neurological: ( - ) numbness, ( - ) tingling, ( - ) new weaknesses Behavioral/Psych: ( - ) mood change, ( - ) new changes  All other systems were reviewed with the patient and are negative.  PHYSICAL EXAMINATION: ECOG PERFORMANCE STATUS: 1 - Symptomatic but completely ambulatory  Vitals:   10/09/23 0900  BP: (!) 143/94  Pulse: 84  Resp: 18  Temp: 98.7 F (37.1 C)  SpO2: 98%   Filed Weights   10/09/23 0900  Weight: 226 lb  14.4 oz (102.9 kg)    GENERAL: well appearing female in NAD  SKIN: skin color, texture, turgor are normal, no rashes or significant lesions EYES: conjunctiva are pink and non-injected, sclera clear OROPHARYNX: no exudate, no erythema; lips, buccal mucosa, and tongue normal  NECK: supple, non-tender LYMPH:  no palpable lymphadenopathy in the cervical or supraclavicular lymph nodes.  LUNGS: clear to auscultation and percussion with normal breathing effort HEART: regular rate & rhythm and no murmurs and no lower extremity edema ABDOMEN: soft, non-tender, non-distended, normal bowel sounds. No hepatosplenomegaly.  Musculoskeletal: no cyanosis of digits and no clubbing. Trace evidence of bruising noted on right elbow. Swelling noted on right elbow, right knee.  PSYCH: alert & oriented x 3, fluent speech NEURO: no focal motor/sensory deficits  LABORATORY DATA:  I have reviewed the data as listed    Latest Ref Rng & Units 10/09/2023   10:13 AM 09/17/2023   10:43 AM 02/03/2023   12:59 PM  CBC  WBC 4.0 - 10.5 K/uL 9.8  11.1  8.7   Hemoglobin 12.0 - 15.0 g/dL 84.6  84.5  85.6   Hematocrit 36.0 - 46.0 % 44.6  46.6  43.7   Platelets 150 - 400 K/uL 253  281.0  295.0        Latest Ref Rng & Units 10/09/2023   10:13 AM 09/17/2023   10:43 AM 02/03/2023   12:59 PM  CMP  Glucose 70 - 99 mg/dL 890  897  85   BUN 6 - 20 mg/dL 7  10  5    Creatinine 0.44 - 1.00 mg/dL 9.40  9.36  9.51   Sodium 135 - 145 mmol/L 136  135  137   Potassium 3.5 - 5.1 mmol/L 4.2  4.6  3.9   Chloride 98 - 111 mmol/L 103  102  104   CO2 22 - 32 mmol/L 22  27  25    Calcium  8.9 - 10.3 mg/dL 9.3  9.7  9.0   Total Protein 6.5 - 8.1 g/dL 7.3  7.6  7.1   Total Bilirubin 0.0 - 1.2 mg/dL 0.4  0.5  0.4   Alkaline Phos 38 - 126 U/L 102  92  113   AST 15 - 41 U/L 18  12  11    ALT 0 - 44 U/L 11  11  6      RADIOGRAPHIC STUDIES: I have personally reviewed the radiological images as listed and agreed with the findings in the  report. CT HEMATURIA WORKUP Result Date: 09/25/2023 CLINICAL DATA:  Right flank pain with gross hematuria. EXAM: CT ABDOMEN AND PELVIS WITHOUT AND WITH CONTRAST TECHNIQUE: Multidetector CT imaging of the abdomen and pelvis was performed following the standard protocol before and following the bolus administration of intravenous contrast. RADIATION DOSE REDUCTION: This exam was performed according to the departmental dose-optimization program which includes automated exposure control, adjustment of the mA and/or kV according to patient size and/or use of iterative reconstruction technique. CONTRAST:  OMNIPAQUE  IOHEXOL  300 MG/ML  SOLN COMPARISON:  CT abdomen and pelvis without contrast 09/17/2023 FINDINGS: Lower chest: Calcified granuloma noted right lower lobe. Hepatobiliary: No suspicious focal abnormality within the liver parenchyma. Gallbladder is surgically absent. No intrahepatic or extrahepatic biliary dilation. Pancreas: No focal mass lesion. No dilatation of the main duct. No intraparenchymal cyst. No peripancreatic edema. Spleen: No splenomegaly. No suspicious focal mass lesion. Adrenals/Urinary Tract: No adrenal nodule or mass. Precontrast imaging shows punctate stones in the interpolar right kidney on coronal images 96 and 109 series 6. 3 punctate stones are seen in the interpolar and lower pole regions of the left kidney measuring up to 4 mm on 92/6. No ureteral or bladder stones. Imaging after IV contrast administration shows no suspicious enhancing lesion in either kidney. Tiny well-defined homogeneous low-density lesions in the left kidney are too small to characterize but are statistically most likely benign and probably cysts. No followup imaging is recommended. Delayed post-contrast imaging shows no wall thickening or soft tissue filling defect in either intrarenal collecting system or renal pelvis. Both ureters are well opacified without evidence for wall thickening, soft tissue lesion or  focal dilatation. Delayed imaging of the bladder shows no focal wall thickening or mass lesion. Stomach/Bowel: Stomach is unremarkable. No gastric wall thickening. No evidence of outlet obstruction. Duodenum is normally positioned as is the ligament of Treitz. No small bowel wall thickening. No small bowel dilatation. The terminal ileum is normal. The appendix is normal. No gross colonic mass. No colonic wall thickening. Vascular/Lymphatic: There is mild atherosclerotic calcification of the abdominal aorta without aneurysm. There is no gastrohepatic or hepatoduodenal ligament lymphadenopathy. No retroperitoneal or mesenteric lymphadenopathy. No pelvic sidewall lymphadenopathy. Reproductive: Unremarkable. Other: No intraperitoneal free fluid. Musculoskeletal: No worrisome lytic or sclerotic osseous abnormality. Spinal stimulator device has been incompletely visualized. IMPRESSION: 1. Bilateral nonobstructing renal stones. No ureteral or bladder stones. No hydronephrosis. 2. No other etiology for hematuria evident. Specifically, no suspicious enhancing lesion in either kidney. No wall thickening or soft tissue filling defect in either intrarenal collecting system, renal pelvis, or ureters. 3.  Aortic Atherosclerosis (ICD10-I70.0). Electronically Signed   By: Camellia Candle M.D.   On: 09/25/2023 11:23   CT ABDOMEN PELVIS WO CONTRAST Result Date: 09/17/2023 CLINICAL DATA:  Right flank pain, stone suspected EXAM: CT ABDOMEN AND PELVIS WITHOUT CONTRAST TECHNIQUE: Multidetector CT imaging of the abdomen and pelvis was performed following the standard protocol without IV contrast. RADIATION DOSE REDUCTION: This exam was performed according to the departmental dose-optimization program which includes automated exposure control, adjustment of the mA and/or kV according to patient size and/or use of iterative reconstruction technique.  COMPARISON:  04/19/2022 FINDINGS: Lower chest: No acute abnormality. Hepatobiliary: No  solid liver abnormality is seen. Hepatic steatosis. Status post cholecystectomy. No biliary ductal dilatation. Pancreas: Unremarkable. No pancreatic ductal dilatation or surrounding inflammatory changes. Spleen: Normal in size without significant abnormality. Adrenals/Urinary Tract: Adrenal glands are unremarkable. Multiple punctuate nonobstructive bilateral renal calculi. No ureteral calculi or hydronephrosis. Multiple phleboliths in the low pelvis which do not reflect urinary tract calculi. Bladder is unremarkable. Stomach/Bowel: Stomach is within normal limits. Appendix appears normal. No evidence of bowel wall thickening, distention, or inflammatory changes. Vascular/Lymphatic: Scattered aortic atherosclerosis. No enlarged abdominal or pelvic lymph nodes. Reproductive: No mass or other significant abnormality. Other: No abdominal wall hernia or abnormality. No ascites. Musculoskeletal: No acute or significant osseous findings. IMPRESSION: 1. Multiple punctuate nonobstructive bilateral renal calculi. No ureteral calculi or hydronephrosis. 2. Hepatic steatosis. 3. Status post cholecystectomy. Aortic Atherosclerosis (ICD10-I70.0). Electronically Signed   By: Marolyn JONETTA Jaksch M.D.   On: 09/17/2023 13:34    ASSESSMENT & PLAN Tammy Decker is a 53 y.o. female who presents to the hematology clinic for evaluation of easy bruising.   #Easy bruising: --Differentials including acquired versus inherited bleeding disorder such as von willebrand or hemophilia carrier.  --Labs today to check CBC, CMP, Von willebrand panel, PT/INR, PTT, peripheral smear and blood type.  --Consider mixing studies based on PT/PTT levels --Tentative return in 10 days for follow up to review results  #Vaginal bleeding: --Etiology unknown. No abnormality seen on recent CT scan from 09/17/2023. --Recommend referral to OB/GYN for further evaluation. Awaiting appt to be scheduled per patient.   Orders Placed This Encounter   Procedures   CMP (Cancer Center only)    Standing Status:   Future    Number of Occurrences:   1    Expiration Date:   10/07/2024   CBC with Differential (Cancer Center Only)    Standing Status:   Future    Number of Occurrences:   1    Expiration Date:   10/07/2024   Protime-INR    Standing Status:   Future    Number of Occurrences:   1    Expiration Date:   10/07/2024   APTT    Standing Status:   Future    Number of Occurrences:   1    Expiration Date:   10/07/2024   Von Willebrand panel    Standing Status:   Future    Number of Occurrences:   1    Expiration Date:   10/08/2024   Technologist smear review    Clinical information::   easy bruising   ABO/RH    Standing Status:   Future    Number of Occurrences:   1    Expiration Date:   10/08/2024    All questions were answered. The patient knows to call the clinic with any problems, questions or concerns.  I have spent a total of 60 minutes minutes of face-to-face and non-face-to-face time, preparing to see the patient, obtaining and/or reviewing separately obtained history, performing a medically appropriate examination, counseling and educating the patient, ordering medications/tests/procedures, referring and communicating with other health care professionals, documenting clinical information in the electronic health record, independently interpreting results and communicating results to the patient, and care coordination.   Johnston Police, PA-C Department of Hematology/Oncology [facility] at [facility]  Patient was seen with Dr. Cloretta.   This was a shared visit with Johnston Police.  Tammy Decker was interviewed and examined.  She is referred for evaluation of easy bruising.  Her history is suggestive of a possible lifelong bleeding disorder with menorrhagia and excessive bleeding following dental procedures.  She recently developed bruising and swelling surrounding joints, post ablation vaginal bleeding, and  microscopic hematuria.  She reports she has been evaluated by urology and has been referred to gynecology.  The PTT was mildly elevated and the Factor VIII level was mildly low on 09/24/2023.  The von Willebrand antigen and ristocetin cofactor activities were normal.  The differential diagnosis includes von Willebrand's disease, a hemophilia carrier, and less likely an acquired factor inhibitor.  A functional platelet defect is less likely.  We will obtain additional diagnostic evaluation today to include a PT/PTT, von Willebrand panel, CBC, and blood type.  She will call for increased bleeding.  I was present for greater than 50% of today's visit.  I performed medical decision making.  Arvella Hof, MD

## 2023-10-10 ENCOUNTER — Encounter: Payer: Self-pay | Admitting: Physician Assistant

## 2023-10-10 LAB — VON WILLEBRAND PANEL
Coagulation Factor VIII: 88 % (ref 56–140)
Ristocetin Co-factor, Plasma: 75 % (ref 50–200)
Von Willebrand Antigen, Plasma: 86 % (ref 50–200)

## 2023-10-10 LAB — COAG STUDIES INTERP REPORT

## 2023-10-14 ENCOUNTER — Telehealth: Payer: Self-pay | Admitting: Oncology

## 2023-10-14 NOTE — Telephone Encounter (Signed)
 Patient has been scheduled for follow-up visit.  Pt noted appt details on personal electronic device.

## 2023-10-15 ENCOUNTER — Other Ambulatory Visit: Payer: Self-pay | Admitting: Nurse Practitioner

## 2023-10-15 ENCOUNTER — Telehealth: Payer: Self-pay | Admitting: Physician Assistant

## 2023-10-15 DIAGNOSIS — R233 Spontaneous ecchymoses: Secondary | ICD-10-CM

## 2023-10-15 DIAGNOSIS — N939 Abnormal uterine and vaginal bleeding, unspecified: Secondary | ICD-10-CM

## 2023-10-15 NOTE — Telephone Encounter (Signed)
 I called Ms. Tammy Decker to review the lab results from 10/09/2023. Workup was unremarkable with normal PT/INR, PTT and vW panel.   Patient reports that her right knee became more swollen after the visit causing more tenderness. Patient will follow up with Dr. Cloretta on 10/27/2023 for further diagnostic workup.   Ms. Belleville expressed understanding with the plan provided.

## 2023-10-16 ENCOUNTER — Other Ambulatory Visit (HOSPITAL_COMMUNITY): Payer: Self-pay

## 2023-10-16 MED ORDER — VALSARTAN 320 MG PO TABS
320.0000 mg | ORAL_TABLET | Freq: Every day | ORAL | 3 refills | Status: DC
  Filled 2023-10-16: qty 90, 90d supply, fill #0

## 2023-10-16 NOTE — Addendum Note (Signed)
 Addended by: CORWIN ANTU on: 10/16/2023 02:07 PM   Modules accepted: Orders

## 2023-10-20 ENCOUNTER — Encounter: Payer: Self-pay | Admitting: Family

## 2023-10-27 ENCOUNTER — Telehealth: Payer: Self-pay | Admitting: Oncology

## 2023-10-27 ENCOUNTER — Other Ambulatory Visit (HOSPITAL_COMMUNITY): Payer: Self-pay

## 2023-10-27 ENCOUNTER — Ambulatory Visit: Admitting: Oncology

## 2023-10-27 ENCOUNTER — Inpatient Hospital Stay

## 2023-10-27 ENCOUNTER — Ambulatory Visit: Admitting: Dermatology

## 2023-10-27 NOTE — Telephone Encounter (Signed)
 PT cs

## 2023-10-27 NOTE — Telephone Encounter (Signed)
 PT is sick and will not be in today.

## 2023-10-29 ENCOUNTER — Encounter: Payer: Self-pay | Admitting: Family

## 2023-10-29 ENCOUNTER — Ambulatory Visit: Admitting: Family

## 2023-10-29 VITALS — BP 124/78 | HR 94 | Temp 98.0°F | Ht 66.0 in | Wt 225.4 lb

## 2023-10-29 DIAGNOSIS — I10 Essential (primary) hypertension: Secondary | ICD-10-CM

## 2023-10-29 DIAGNOSIS — R059 Cough, unspecified: Secondary | ICD-10-CM

## 2023-10-29 DIAGNOSIS — H6691 Otitis media, unspecified, right ear: Secondary | ICD-10-CM

## 2023-10-29 DIAGNOSIS — J029 Acute pharyngitis, unspecified: Secondary | ICD-10-CM | POA: Diagnosis not present

## 2023-10-29 LAB — POC COVID19 BINAXNOW: SARS Coronavirus 2 Ag: NEGATIVE

## 2023-10-29 LAB — POCT RAPID STREP A (OFFICE): Rapid Strep A Screen: NEGATIVE

## 2023-10-29 MED ORDER — CEFDINIR 300 MG PO CAPS: 300.0000 mg | ORAL_CAPSULE | Freq: Two times a day (BID) | ORAL | 0 refills | Status: AC

## 2023-10-29 MED ORDER — AMOXICILLIN-POT CLAVULANATE 875-125 MG PO TABS: 1.0000 | ORAL_TABLET | Freq: Two times a day (BID) | ORAL | 0 refills | Status: DC

## 2023-10-29 MED ORDER — VALSARTAN 320 MG PO TABS
320.0000 mg | ORAL_TABLET | Freq: Every day | ORAL | 3 refills | Status: DC
Start: 2023-10-29 — End: 2023-12-22

## 2023-10-29 NOTE — Progress Notes (Signed)
 Established Patient Office Visit  Subjective:      CC:  Chief Complaint  Patient presents with  . Medical Management of Chronic Issues    Pt states here for check up.   . Cough    C/o cough, nasal/chest congestion, ear pain and fatigue. Sxs started 10/17/23.     HPI: Tammy Decker is a 53 y.o. female presenting on 10/29/2023 for Medical Management of Chronic Issues (Pt states here for check up. ) and Cough (C/o cough, nasal/chest congestion, ear pain and fatigue. Sxs started 10/17/23. ) .  Discussed the use of AI scribe software for clinical note transcription with the patient, who gave verbal consent to proceed.  History of Present Illness          Social history:  Relevant past medical, surgical, family and social history reviewed and updated as indicated. Interim medical history since our last visit reviewed.  Allergies and medications reviewed and updated.  DATA REVIEWED: CHART IN EPIC     ROS: Negative unless specifically indicated above in HPI.    Current Outpatient Medications:  .  cefdinir  (OMNICEF ) 300 MG capsule, Take 1 capsule (300 mg total) by mouth 2 (two) times daily for 10 days., Disp: 20 capsule, Rfl: 0 .  clobetasol  ointment (TEMOVATE ) 0.05 %, Apply 1 Application topically 2 (two) times daily. twice daily to lower legs x 2 weeks on and 2 weeks off until clear when condition is flared., Disp: 60 g, Rfl: 1 .  cyanocobalamin  (VITAMIN B12) 1000 MCG/ML injection, Inject 1 ml 1000 mcg total into muscle once a week for three weeks then once a month for three months, Disp: 4 mL, Rfl: 3 .  EPINEPHrine  0.3 mg/0.3 mL IJ SOAJ injection, Inject 0.3 mg into the muscle as needed for anaphylaxis., Disp: 1 each, Rfl: 1 .  LUER LOCK SAFETY SYRINGES 25G X 1 3 ML MISC, USE TO INJECT B12 INTO THE MUSCLE ONCE WEEKLY FOR 4 WEEK THEN USE ONCE MONTHLY FOR 3 MONTHS, Disp: 7 each, Rfl: 0 .  ondansetron  (ZOFRAN -ODT) 4 MG disintegrating tablet, Take 1 tablet (4 mg  total) by mouth every 8 (eight) hours as needed for nausea or vomiting., Disp: 20 tablet, Rfl: 0 .  pantoprazole  (PROTONIX ) 20 MG tablet, Take 1 tablet (20 mg total) by mouth daily., Disp: 30 tablet, Rfl: 0 .  tamsulosin  (FLOMAX ) 0.4 MG CAPS capsule, Take 1 capsule (0.4 mg total) by mouth daily., Disp: 30 capsule, Rfl: 0 .  valsartan  (DIOVAN ) 320 MG tablet, Take 1 tablet (320 mg total) by mouth daily., Disp: 90 tablet, Rfl: 3        Objective:        BP 124/78   Pulse 94   Temp 98 F (36.7 C) (Oral)   Ht 5' 6 (1.676 m)   Wt 225 lb 6 oz (102.2 kg)   SpO2 95%   BMI 36.38 kg/m   Physical Exam   Wt Readings from Last 3 Encounters:  10/29/23 225 lb 6 oz (102.2 kg)  10/09/23 226 lb 14.4 oz (102.9 kg)  09/24/23 198 lb (89.8 kg)    Physical Exam Vitals reviewed.  Constitutional:      General: She is not in acute distress.    Appearance: Normal appearance. She is normal weight. She is not ill-appearing, toxic-appearing or diaphoretic.  HENT:     Head: Normocephalic.     Right Ear: Tympanic membrane normal.     Left Ear: Tympanic membrane normal.  Nose: Nose normal.     Mouth/Throat:     Mouth: Mucous membranes are dry.     Pharynx: Posterior oropharyngeal erythema and postnasal drip present. No oropharyngeal exudate.  Eyes:     Extraocular Movements: Extraocular movements intact.     Pupils: Pupils are equal, round, and reactive to light.  Cardiovascular:     Rate and Rhythm: Normal rate and regular rhythm.     Pulses: Normal pulses.     Heart sounds: Normal heart sounds.  Pulmonary:     Effort: Pulmonary effort is normal.     Breath sounds: Examination of the right-upper field reveals wheezing. Wheezing (fleeting on expiration) present. No decreased breath sounds, rhonchi or rales.  Musculoskeletal:     Cervical back: Normal range of motion.  Neurological:     General: No focal deficit present.     Mental Status: She is alert and oriented to person, place, and  time. Mental status is at baseline.  Psychiatric:        Mood and Affect: Mood normal.        Behavior: Behavior normal.        Thought Content: Thought content normal.        Judgment: Judgment normal.          Results   Assessment & Plan:   Assessment and Plan\ Assessment & Plan       Return in about 6 months (around 04/30/2024) for f/u CPE.     Ginger Patrick, MSN, APRN, FNP-C [facility]

## 2023-10-29 NOTE — Patient Instructions (Signed)
  [facility] [address], [address] [place] [address] [phone]

## 2023-11-02 ENCOUNTER — Encounter: Payer: Self-pay | Admitting: Family

## 2023-11-17 ENCOUNTER — Inpatient Hospital Stay: Admitting: Oncology

## 2023-11-17 ENCOUNTER — Inpatient Hospital Stay: Attending: Physician Assistant

## 2023-11-21 ENCOUNTER — Ambulatory Visit: Admitting: Cardiovascular Disease

## 2023-11-21 DIAGNOSIS — I479 Paroxysmal tachycardia, unspecified: Secondary | ICD-10-CM | POA: Insufficient documentation

## 2023-11-21 NOTE — Progress Notes (Deleted)
 Cardiology Office Note  Date:  11/21/2023   ID:  Tammy Decker, DOB [birth year], MRN [id number]  PCP:  Corwin Antu, FNP   No chief complaint on file.   HPI:  Tammy Decker a 53 y.o. 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  PSH:    Past Surgical History:  Procedure Laterality Date   BACK SURGERY     CHOLECYSTECTOMY     ESOPHAGOGASTRODUODENOSCOPY (EGD) WITH PROPOFOL  N/A 06/19/2021   Procedure: ESOPHAGOGASTRODUODENOSCOPY (EGD) WITH PROPOFOL ;  Surgeon: Unk Corinn Skiff, MD;  Location: [facility] ENDOSCOPY;  Service: Gastroenterology;  Laterality: N/A;   NECK SURGERY     uterine ablation     XI ROBOTIC ASSISTED VENTRAL HERNIA N/A 05/09/2020   Procedure: XI ROBOTIC ASSISTED VENTRAL HERNIA, incisional hernia;  Surgeon: Jordis Laneta FALCON, MD;  Location: [facility] ORS;  Service: General;  Laterality: N/A;    Current Outpatient Medications  Medication Sig Dispense Refill   clobetasol  ointment (TEMOVATE ) 0.05 % Apply 1 Application topically 2 (two) times daily. twice daily to lower legs x 2 weeks on and 2 weeks off until clear when condition is flared. 60 g 1   cyanocobalamin  (VITAMIN B12) 1000 MCG/ML injection Inject 1 ml 1000 mcg total into muscle once a week for three weeks then once a month for three months 4 mL 3   EPINEPHrine  0.3 mg/0.3 mL IJ SOAJ injection Inject 0.3 mg into the muscle as needed for anaphylaxis. 1 each 1   LUER LOCK SAFETY SYRINGES 25G X 1 3 ML MISC USE TO INJECT B12 INTO THE MUSCLE ONCE WEEKLY FOR 4 WEEK THEN USE ONCE  MONTHLY FOR 3 MONTHS 7 each 0   ondansetron  (ZOFRAN -ODT) 4 MG disintegrating tablet Take 1 tablet (4 mg total) by mouth every 8 (eight) hours as needed for nausea or vomiting. 20 tablet 0   pantoprazole  (PROTONIX ) 20 MG tablet Take 1 tablet (20 mg total) by mouth daily. 30 tablet 0   tamsulosin  (FLOMAX ) 0.4 MG CAPS capsule Take 1 capsule (0.4 mg total) by mouth daily. 30 capsule 0   valsartan  (DIOVAN ) 320 MG tablet Take 1 tablet (320 mg total) by mouth daily. 90 tablet 3   No current facility-administered medications for this visit.     Allergies:   Ciprofloxacin, Kiwi extract, Metoprolol , Other, Propranolol , and Sulfa antibiotics   Social History:  The patient  reports that she has been smoking cigarettes. She has been exposed to tobacco smoke. She has never used smokeless tobacco. She reports current alcohol use. She reports that she does not use drugs.   Family History:   family history includes Colon cancer in her maternal grandfather; Diabetes in her father and mother; Early death in her paternal grandfather; Heart attack in her father and paternal grandfather; Ovarian cancer in her maternal grandmother.    Review of Systems: ROS   PHYSICAL EXAM: VS:  There were no vitals taken for this visit. , BMI There is no height or weight on file to calculate BMI. GEN: Well nourished, well developed, in no acute distress HEENT: normal Neck: no JVD, carotid bruits, or masses Cardiac: RRR; no murmurs, rubs,  or gallops,no edema  Respiratory:  clear to auscultation bilaterally, normal work of breathing GI: soft, nontender, nondistended, + BS MS: no deformity or atrophy Skin: warm and dry, no rash Neuro:  Strength and sensation are intact Psych: euthymic mood, full affect    Recent Labs: 02/03/2023: TSH 2.34 10/09/2023: ALT 11; BUN 7; Creatinine 0.59; Hemoglobin 15.3; Platelet Count 253; Potassium 4.2; Sodium 136    Lipid Panel Lab Results  Component Value Date   CHOL 190 11/14/2022    HDL 41.10 11/14/2022   LDLCALC 130 (H) 11/14/2022   TRIG 96.0 11/14/2022      Wt Readings from Last 3 Encounters:  10/29/23 225 lb 6 oz (102.2 kg)  10/09/23 226 lb 14.4 oz (102.9 kg)  09/24/23 198 lb (89.8 kg)       ASSESSMENT AND PLAN:  Problem List Items Addressed This Visit   None    Disposition:   F/U  12 months   Total encounter time more than 30 minutes  Greater than 50% was spent in counseling and coordination of care with the patient    Signed, Velinda Lunger, M.D., Ph.D. [facility] [place], [place] [phone]

## 2023-12-10 ENCOUNTER — Encounter: Payer: Self-pay | Admitting: Family

## 2023-12-16 ENCOUNTER — Encounter: Payer: Self-pay | Admitting: Family

## 2023-12-16 DIAGNOSIS — I10 Essential (primary) hypertension: Secondary | ICD-10-CM

## 2023-12-22 MED ORDER — LISINOPRIL 40 MG PO TABS: 40.0000 mg | ORAL_TABLET | Freq: Every day | ORAL | 3 refills | Status: DC

## 2023-12-22 NOTE — Addendum Note (Signed)
 Addended by: CORWIN ANTU on: 12/22/2023 06:53 AM   Modules accepted: Orders

## 2023-12-26 ENCOUNTER — Ambulatory Visit: Payer: Self-pay

## 2023-12-26 NOTE — Telephone Encounter (Signed)
 Please advise patient to monitor BP over the weekend. If she develops any headaches, blurred vision, unilateral weakness, dizziness, lightheadedness, chest pain or shortness of breath.   Start monitoring your blood pressure daily, around the same time of day Ensure that you have rested for 30 minutes prior to checking your blood pressure.   Please provide patient with strict ER precautions.   -Carrol Aurora, DNP, AGNP-C 12/26/2023 3:17 PM

## 2023-12-26 NOTE — Telephone Encounter (Signed)
 Spoke with pt. Since her original call, she has gotten home and checked her blood pressure and it's 168/100. Pt denies any headache, blurred vision, dizziness, lightheadedness, chest pain, shortness of breath. I have scheduled her an appointment with Tabitha on 12/29/23 at 1200 to discuss her medications but pt would like someone to address her BP before the weekend.

## 2023-12-26 NOTE — Telephone Encounter (Signed)
 Called [facility] and advised them of patient's symptoms and requesting advice from her PCP

## 2023-12-26 NOTE — Telephone Encounter (Signed)
 Spoke with pt and she is aware of Bableen's recommendations. Pt will keep her appt with Tabitha on 12/29/23 at 1200.

## 2023-12-26 NOTE — Telephone Encounter (Signed)
 Blood pressure elevated 164/101 at Urgent Care today after a finger injury. Patient states that her blood pressure was fine on the Valsartan   Patient was wondering if it would be best for her to stop the Lisinopril and take maybe one tablet of the Losartan . Patient denies chest pain or any other symptoms besides feeling nervous about her blood pressure being high.    FYI Only or Action Required?: Action required by provider: clinical question for provider and update on patient condition.  Patient was last seen in primary care on 10/29/2023 by Corwin Antu, FNP.  Called Nurse Triage reporting Hypertension.  Symptoms began today.  Interventions attempted: Nothing.  Symptoms are: unchanged.  Triage Disposition: See PCP When Office is Open (Within 3 Days)  Patient/caregiver understands and will follow disposition?: No, wishes to speak with PCP         Copied from CRM #[id number]. Topic: Clinical - Red Word Triage >> Dec 26, 2023  1:51 PM Berneda FALCON wrote: Red Word that prompted transfer to Nurse Triage: Patient states she accidentally slammed her index finger on the right hand in the car door and had to go to urgent care to get treated for the wound. At this appt, she states that her blood pressure was elevated and they took the blood pressure a couple of times. The last time, it was 170/101. Patient states she is feeling well overall but has anxiety about everything and is not sure what to do about her high blood pressure. The nurse at urgent care told her to just wait, but patient feels uncomfortable with this advice. Reason for Disposition  Systolic BP >= 160 OR Diastolic >= 100  Answer Assessment - Initial Assessment Questions Patient states that she accidentally shut her right index finger in a sliding door She states xrays were   Patient states she was on Losartan  and she was on two a day then she was put on Valsartan  but patient had bad flushing so then put on Lisinopril a  few days ago  Patient is now on Lisinopril and she has only been on it for a few days  Last reading at Urgent Care was 164/101. Patient states that the Urgent Care advised her to wait a few weeks and see how the new blood pressure medication does. Patient did not feel uncomfortable with that information Patient is in the car on her way from the Urgent Care at the time of this triage call. Patient states she has a blood pressure cuff at home and she states she will check her blood pressure again at home, rest there for about 5-6 minutes, and take it again to see if it is consistently elevated. She is advised to call us  back at any time with any questions or to report her findings with her blood pressure.  Patient states that her blood pressure was fine on the Valsartan   Patient was wondering if it would be best for her to stop the Lisinopril and take maybe one tablet of the Losartan . Patient denies chest pain or any other symptoms besides feeling nervous about her blood pressure being high. Patient didn't want to make an appointment at this time--She wanted to see her PCP's advice on what she should do with her blood pressure medications. She is advised to call us  back at any time and if anything gets worse to go to the Emergency Room She verbalized understanding.  Protocols used: Blood Pressure - High-A-AH

## 2023-12-29 ENCOUNTER — Ambulatory Visit: Admitting: Family

## 2024-01-08 ENCOUNTER — Ambulatory Visit: Admitting: Dermatology

## 2024-01-08 ENCOUNTER — Other Ambulatory Visit: Payer: Self-pay | Admitting: Family

## 2024-01-21 ENCOUNTER — Other Ambulatory Visit: Payer: Self-pay

## 2024-01-21 ENCOUNTER — Encounter (HOSPITAL_COMMUNITY): Payer: Self-pay

## 2024-01-21 ENCOUNTER — Emergency Department (HOSPITAL_COMMUNITY)
Admission: EM | Admit: 2024-01-21 | Discharge: 2024-01-21 | Disposition: A | Discharge status: Home / Self Care | Attending: Emergency Medicine | Admitting: Emergency Medicine

## 2024-01-21 ENCOUNTER — Emergency Department (HOSPITAL_COMMUNITY)

## 2024-01-21 DIAGNOSIS — I10 Essential (primary) hypertension: Secondary | ICD-10-CM | POA: Diagnosis not present

## 2024-01-21 DIAGNOSIS — K2900 Acute gastritis without bleeding: Secondary | ICD-10-CM | POA: Diagnosis not present

## 2024-01-21 DIAGNOSIS — Z79899 Other long term (current) drug therapy: Secondary | ICD-10-CM | POA: Diagnosis not present

## 2024-01-21 DIAGNOSIS — R1013 Epigastric pain: Secondary | ICD-10-CM

## 2024-01-21 DIAGNOSIS — R1085 Abdominal pain of multiple sites: Secondary | ICD-10-CM

## 2024-01-21 DIAGNOSIS — K2920 Alcoholic gastritis without bleeding: Secondary | ICD-10-CM

## 2024-01-21 DIAGNOSIS — R109 Unspecified abdominal pain: Secondary | ICD-10-CM | POA: Diagnosis present

## 2024-01-21 DIAGNOSIS — J45909 Unspecified asthma, uncomplicated: Secondary | ICD-10-CM | POA: Insufficient documentation

## 2024-01-21 DIAGNOSIS — R112 Nausea with vomiting, unspecified: Secondary | ICD-10-CM

## 2024-01-21 LAB — URINALYSIS, ROUTINE W REFLEX MICROSCOPIC
Bacteria, UA: NONE SEEN
Bilirubin Urine: NEGATIVE
Glucose, UA: NEGATIVE mg/dL
Hgb urine dipstick: NEGATIVE
Ketones, ur: NEGATIVE mg/dL
Leukocytes,Ua: NEGATIVE
Nitrite: NEGATIVE
Protein, ur: NEGATIVE mg/dL
Specific Gravity, Urine: 1.01 (ref 1.005–1.030)
pH: 7 (ref 5.0–8.0)

## 2024-01-21 LAB — COMPREHENSIVE METABOLIC PANEL WITH GFR
ALT: 109 U/L — ABNORMAL HIGH (ref 0–44)
AST: 102 U/L — ABNORMAL HIGH (ref 15–41)
Albumin: 4 g/dL (ref 3.5–5.0)
Alkaline Phosphatase: 108 U/L (ref 38–126)
Anion gap: 10 (ref 5–15)
BUN: <5 mg/dL — ABNORMAL LOW (ref 6–20)
CO2: 24 mmol/L (ref 22–32)
Calcium: 9.4 mg/dL (ref 8.9–10.3)
Chloride: 102 mmol/L (ref 98–111)
Creatinine, Ser: 0.6 mg/dL (ref 0.44–1.00)
GFR, Estimated: >60 mL/min (ref >60)
Glucose, Bld: 109 mg/dL — ABNORMAL HIGH (ref 70–99)
Potassium: 3.9 mmol/L (ref 3.5–5.1)
Sodium: 137 mmol/L (ref 135–145)
Total Bilirubin: 0.7 mg/dL (ref 0.0–1.2)
Total Protein: 7.7 g/dL (ref 6.5–8.1)

## 2024-01-21 LAB — CBC
HCT: 46 % (ref 36.0–46.0)
Hemoglobin: 15.6 g/dL — ABNORMAL HIGH (ref 12.0–15.0)
MCH: 34.1 pg — ABNORMAL HIGH (ref 26.0–34.0)
MCHC: 33.9 g/dL (ref 30.0–36.0)
MCV: 100.4 fL — ABNORMAL HIGH (ref 80.0–100.0)
Platelets: 243 K/uL (ref 150–400)
RBC: 4.58 MIL/uL (ref 3.87–5.11)
RDW: 13.2 % (ref 11.5–15.5)
WBC: 7 K/uL (ref 4.0–10.5)
nRBC: 0 % (ref 0.0–0.2)

## 2024-01-21 LAB — HEPATIC FUNCTION PANEL
ALT: 111 U/L — ABNORMAL HIGH (ref 0–44)
AST: 102 U/L — ABNORMAL HIGH (ref 15–41)
Albumin: 4 g/dL (ref 3.5–5.0)
Alkaline Phosphatase: 107 U/L (ref 38–126)
Bilirubin, Direct: 0.3 mg/dL — ABNORMAL HIGH (ref 0.0–0.2)
Indirect Bilirubin: 0.4 mg/dL (ref 0.3–0.9)
Total Bilirubin: 0.7 mg/dL (ref 0.0–1.2)
Total Protein: 7.8 g/dL (ref 6.5–8.1)

## 2024-01-21 LAB — LIPASE, BLOOD: Lipase: 52 U/L — ABNORMAL HIGH (ref 11–51)

## 2024-01-21 LAB — TRIGLYCERIDES: Triglycerides: 62 mg/dL (ref <150)

## 2024-01-21 MED ORDER — PANTOPRAZOLE SODIUM 40 MG PO TBEC: 40.0000 mg | DELAYED_RELEASE_TABLET | Freq: Every day | ORAL | 0 refills | Status: AC

## 2024-01-21 MED ORDER — MORPHINE SULFATE (PF) 4 MG/ML IV SOLN
4.0000 mg | Freq: Once | INTRAVENOUS | Status: AC
  Administered 2024-01-21: 4 mg via INTRAVENOUS
  Filled 2024-01-21: qty 1

## 2024-01-21 MED ORDER — ALUM & MAG HYDROXIDE-SIMETH 200-200-20 MG/5ML PO SUSP
30.0000 mL | Freq: Once | ORAL | Status: AC
  Administered 2024-01-21: 30 mL via ORAL
  Filled 2024-01-21: qty 30

## 2024-01-21 MED ORDER — SODIUM CHLORIDE (PF) 0.9 % IJ SOLN
INTRAMUSCULAR | Status: AC
  Filled 2024-01-21: qty 50

## 2024-01-21 MED ORDER — ONDANSETRON HCL 4 MG/2ML IJ SOLN
4.0000 mg | Freq: Once | INTRAMUSCULAR | Status: AC
  Administered 2024-01-21: 4 mg via INTRAVENOUS
  Filled 2024-01-21: qty 2

## 2024-01-21 MED ORDER — DICYCLOMINE HCL 20 MG PO TABS: 20.0000 mg | ORAL_TABLET | Freq: Two times a day (BID) | ORAL | 0 refills | Status: DC

## 2024-01-21 MED ORDER — LACTATED RINGERS IV BOLUS
1000.0000 mL | Freq: Once | INTRAVENOUS | Status: AC
  Administered 2024-01-21: 1000 mL via INTRAVENOUS

## 2024-01-21 MED ORDER — ONDANSETRON HCL 4 MG PO TABS: 4.0000 mg | ORAL_TABLET | Freq: Three times a day (TID) | ORAL | 0 refills | Status: AC | PRN

## 2024-01-21 MED ORDER — PANTOPRAZOLE SODIUM 40 MG IV SOLR
40.0000 mg | Freq: Once | INTRAVENOUS | Status: AC
  Administered 2024-01-21: 40 mg via INTRAVENOUS
  Filled 2024-01-21: qty 10

## 2024-01-21 MED ORDER — IOHEXOL 300 MG/ML  SOLN
100.0000 mL | Freq: Once | INTRAMUSCULAR | Status: AC | PRN
  Administered 2024-01-21: 100 mL via INTRAVENOUS

## 2024-01-21 NOTE — ED Triage Notes (Signed)
 Pt arrives via POV. Pt c/o abdominal pain, nausea, vomiting, diarrhea, and dysuria for the past few days. PT is AxOx4.

## 2024-01-21 NOTE — ED Provider Notes (Signed)
 [place] EMERGENCY DEPARTMENT AT [facility] Provider Note   [name]: [id number] Arrival date & time: 01/21/24  9167     Patient presents with: Abdominal Pain, Emesis, and Dysuria   Tammy Decker is a 53 y.o. female.  Patient presents accompanied by her husband.  Past medical history of tachycardia, asthma, GERD, hypertension, alcohol use disorder and history of pyelonephritis.  Over the weekend she drank too much and started having bladder pain.  She took a couple of Keflex  antibiotics and says the pain did improve.  She is still having some bladder pain.  She denies abnormal discharge or itchiness in the external genitalia, most of her pain is within and she does have some pain when peeing.   The last time she drank alcohol was last night, but she does had a couple of beers to try and make herself feel better.   Around the same time this weekend she started having loose stools, she reports going all day long and that they are watery and light yellow-orange in color.  She denies pain with stooling and denies seeing blood in the stool.  She has also been vomiting since this weekend, the last time she ate was yesterday so now she is not vomiting up any food but is retching and very uncomfortable and in a lot of pain.  She denies seeing blood in her vomit.  Is having upper abdominal pain.  She denies cough, runny nose or myalgias.  She does feel short of breath when walking up stairs, this did not start until yesterday as she increasingly weak.  She denies chest pain, acute visual changes, or dyspnea. Denies ever having symptoms of alcohol withdrawal.    Abdominal Pain Associated symptoms: dysuria and vomiting   Emesis Associated symptoms: abdominal pain   Dysuria Associated symptoms: abdominal pain and vomiting        Prior to Admission medications   Medication Sig Start Date End Date Taking? Authorizing Provider  dicyclomine (BENTYL) 20 MG tablet Take 1 tablet (20  mg total) by mouth 2 (two) times daily for 3 days. 01/21/24 01/24/24 Yes Sharyl Panchal, Viktoria, DO  ondansetron  (ZOFRAN ) 4 MG tablet Take 1 tablet (4 mg total) by mouth every 8 (eight) hours as needed for up to 3 days for nausea or vomiting. 01/21/24 01/24/24 Yes Hind Chesler, Viktoria, DO  pantoprazole  (PROTONIX ) 40 MG tablet Take 1 tablet (40 mg total) by mouth daily for 15 days. 01/21/24 02/05/24 Yes Keandria Berrocal, Viktoria, DO  clobetasol  ointment (TEMOVATE ) 0.05 % Apply 1 Application topically 2 (two) times daily. twice daily to lower legs x 2 weeks on and 2 weeks off until clear when condition is flared. 10/08/23   Alm Delon SAILOR, DO  cyanocobalamin  (VITAMIN B12) 1000 MCG/ML injection Inject 1 ml 1000 mcg total into muscle once a week for three weeks then once a month for three months 01/09/23   Corwin Antu, FNP  EPINEPHrine  0.3 mg/0.3 mL IJ SOAJ injection Inject 0.3 mg into the muscle as needed for anaphylaxis. 02/13/23   Dugal, Tabitha, FNP  lisinopril (ZESTRIL) 40 MG tablet Take 1 tablet (40 mg total) by mouth daily. 12/22/23   Corwin Antu, FNP  LUER LOCK SAFETY SYRINGES 25G X 1 3 ML MISC USE TO INJECT B12 INTO THE MUSCLE ONCE WEEKLY FOR 4 WEEK THEN USE ONCE MONTHLY FOR 3 MONTHS 03/31/23   Corwin Antu, FNP  tamsulosin  (FLOMAX ) 0.4 MG CAPS capsule Take 1 capsule (0.4 mg total) by mouth daily. 09/22/23  Corwin Antu, FNP    Allergies: Ciprofloxacin, Kiwi extract, Metoprolol , Other, Propranolol , Sulfa antibiotics, and Valsartan     Review of Systems  Gastrointestinal:  Positive for abdominal pain and vomiting.  Genitourinary:  Positive for dysuria.    Updated Vital Signs BP (!) 161/95 (BP Location: Left Arm)   Pulse 72   Temp 98.1 F (36.7 C) (Oral)   Resp 18   SpO2 97%   Physical Exam Vitals reviewed.  Constitutional:      Appearance: She is ill-appearing. She is not diaphoretic.  HENT:     Head: Normocephalic and atraumatic.     Mouth/Throat:     Pharynx: Oropharynx is clear.  Eyes:      General: No scleral icterus.    Extraocular Movements: Extraocular movements intact.  Cardiovascular:     Rate and Rhythm: Normal rate and regular rhythm.     Heart sounds: Normal heart sounds.  Pulmonary:     Effort: Pulmonary effort is normal. No respiratory distress.     Breath sounds: Normal breath sounds. No wheezing.  Abdominal:     General: Bowel sounds are normal. There is distension.     Palpations: Abdomen is soft. There is no hepatomegaly, splenomegaly, mass or pulsatile mass.     Tenderness: There is abdominal tenderness in the epigastric area and suprapubic area. There is guarding. There is no right CVA tenderness, left CVA tenderness or rebound.     Hernia: No hernia is present.  Skin:    General: Skin is warm.  Neurological:     General: No focal deficit present.     Mental Status: She is alert and oriented to person, place, and time.  Psychiatric:        Mood and Affect: Mood normal.        Behavior: Behavior normal.     (all labs ordered are listed, but only abnormal results are displayed) Labs Reviewed  LIPASE, BLOOD - Abnormal; Notable for the following components:      Result Value   Lipase 52 (*)    All other components within normal limits  COMPREHENSIVE METABOLIC PANEL WITH GFR - Abnormal; Notable for the following components:   Glucose, Bld 109 (*)    BUN <5 (*)    AST 102 (*)    ALT 109 (*)    All other components within normal limits  CBC - Abnormal; Notable for the following components:   Hemoglobin 15.6 (*)    MCV 100.4 (*)    MCH 34.1 (*)    All other components within normal limits  HEPATIC FUNCTION PANEL - Abnormal; Notable for the following components:   AST 102 (*)    ALT 111 (*)    Bilirubin, Direct 0.3 (*)    All other components within normal limits  URINALYSIS, ROUTINE W REFLEX MICROSCOPIC  TRIGLYCERIDES    EKG: None  Radiology: US  Abdomen Limited RUQ (LIVER/GB) Result Date: 01/21/2024 EXAM: Right Upper Quadrant Abdominal  Ultrasound 01/21/2024 02:23:41 PM TECHNIQUE: Real-time ultrasonography of the right upper quadrant of the abdomen was performed. COMPARISON: US  Abdomen 11/15/2021. CLINICAL HISTORY: Right upper quadrant pain, cholecystectomy. FINDINGS: LIVER: Increased echogenicity of hepatic parenchyma suggesting hepatic steatosis. No intrahepatic biliary ductal dilatation. No evidence of mass. BILIARY SYSTEM: Status post cholecystectomy. The common bile duct measures 4.2 mm. OTHER: No right upper quadrant ascites. IMPRESSION: 1. Status post cholecystectomy. 2. Increased hepatic echogenicity compatible with hepatic steatosis. Electronically signed by: Lynwood Seip MD 01/21/2024 02:45 PM EST RP Workstation: [id number]  CT ABDOMEN PELVIS W CONTRAST Result Date: 01/21/2024 CLINICAL DATA:  Abdominal pain, nausea vomiting and diarrhea. EXAM: CT ABDOMEN AND PELVIS WITH CONTRAST TECHNIQUE: Multidetector CT imaging of the abdomen and pelvis was performed using the standard protocol following bolus administration of intravenous contrast. RADIATION DOSE REDUCTION: This exam was performed according to the departmental dose-optimization program which includes automated exposure control, adjustment of the mA and/or kV according to patient size and/or use of iterative reconstruction technique. CONTRAST:  OMNIPAQUE  IOHEXOL  300 MG/ML  SOLN COMPARISON:  CT dated 09/25/2023. FINDINGS: Lower chest: The visualized lung bases are clear. No intra-abdominal free air or free fluid. Hepatobiliary: Fatty liver. No biliary dilatation. Cholecystectomy. Pancreas: Unremarkable. No pancreatic ductal dilatation or surrounding inflammatory changes. Spleen: Normal in size without focal abnormality. Adrenals/Urinary Tract: The adrenal glands unremarkable. Punctate nonobstructing right renal inferior pole calculus. Vascular calcification versus punctate nonobstructing left renal inferior pole calculus. There is no hydronephrosis on either side. There is  symmetric enhancement and excretion of contrast by both kidneys. The visualized ureters appear unremarkable. The urinary bladder is predominantly collapsed. Stomach/Bowel: There is no bowel obstruction or active inflammation. The appendix is normal. Vascular/Lymphatic: Mild atherosclerotic calcification of the abdominal aorta. The IVC is unremarkable. No portal venous gas. There is no adenopathy. Reproductive: The uterus is anteverted. No suspicious adnexal masses. Other: No abdominal wall hernia or abnormality. No abdominopelvic ascites. Musculoskeletal: No acute osseous pathology.  Spinal stimulator. IMPRESSION: 1. No acute intra-abdominal or pelvic pathology. 2. Fatty liver. 3. Punctate nonobstructing right renal inferior pole calculus. No hydronephrosis. 4. No bowel obstruction. Normal appendix. 5.  Aortic Atherosclerosis (ICD10-I70.0). Electronically Signed   By: Vanetta Chou M.D.   On: 01/21/2024 13:37     Procedures   Medications Ordered in the ED  ondansetron  (ZOFRAN ) injection 4 mg (4 mg Intravenous Given 01/21/24 1021)  morphine  (PF) 4 MG/ML injection 4 mg (4 mg Intravenous Given 01/21/24 1025)  lactated ringers  bolus 1,000 mL (1,000 mLs Intravenous New Bag/Given 01/21/24 1025)  iohexol  (OMNIPAQUE ) 300 MG/ML solution 100 mL (100 mLs Intravenous Contrast Given 01/21/24 1255)  pantoprazole  (PROTONIX ) injection 40 mg (40 mg Intravenous Given 01/21/24 1413)  morphine  (PF) 4 MG/ML injection 4 mg (4 mg Intravenous Given 01/21/24 1406)  alum & mag hydroxide-simeth (MAALOX/MYLANTA) 200-200-20 MG/5ML suspension 30 mL (30 mLs Oral Given 01/21/24 1410)    Clinical Course as of 01/21/24 1612  Wed Jan 21, 2024  1343 CT without acute intra-abdominal or pelvic pathology.  Does reveal a nephrolithiasis in the right kidney without obstruction.  No bowel obstruction, normal appendix. [EL]  1436 Patient is still having epigastric pain although nausea has improved.  She has had 1 diarrhea episode since  being here.  Will try PPI and Maalox. [EL]    Clinical Course User Index [EL] Charmayne Holmes, DO                                 Medical Decision Making Amount and/or Complexity of Data Reviewed Labs: ordered. Radiology: ordered.  Risk OTC drugs. Prescription drug management.   This patient presents to the ED for concern of nausea and vomiting and abdominal pain, this involves an extensive number of treatment options, and is a complaint that carries with it a high risk of complications and morbidity.  The differential diagnosis includes gastroenteritis, pyelonephritis, urolithiasis, pancreatitis, appendicitis, peptic ulcer disease, acute cystitis, small bowel obstruction, less likely DKA, heart failure exacerbation, abdominal  aortic aneurysm   Co morbidities / Chronic conditions that complicate the patient evaluation  Alcohol use disorder, tachycardia, hypertension   Additional history obtained:  Additional history obtained from EMR External records from outside source obtained and reviewed including history of tachycardia   Lab Tests:  I Ordered, and personally interpreted labs.  The pertinent results include: Glucose 109, AST 102, ALT 109, alk phos 108, hemoglobin 15.6, MCV 100.4, MCH 34.1.  Lipase 52.  Urinalysis normal.  Triglycerides 62.    Imaging Studies ordered:  I ordered imaging studies including Right upper quadrant ultrasound and CT abdomen pelvis with contrast  I independently visualized and interpreted imaging which showed  Right upper quadrant consistent with fatty liver and history of cholecystectomy CT abdomen without acute intra-abdominal or pelvic abnormality, nephrolithiasis of right kidney I agree with the radiologist interpretation   Cardiac Monitoring: / EKG:  None   Problem List / ED Course / Critical interventions / Medication management  Nausea and vomiting and diarrhea. White blood cell 7 less likely infectious etiology although viral  gastroenteritis is possible.  Hemoglobin elevated likely due to hemoconcentration in the setting of poor p.o. intake and dehydration. Patient has mild suprapubic pain, although urinalysis is unremarkable.  She did take a few doses of Keflex  in the days prior to ED presentation. Recent alcohol binge and associated abdominal pain, nausea, vomiting, diarrhea with associated elevated LFTs.  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 I ordered medication including Zofran , morphine , Maalox and pantoprazole  Reevaluation of the patient after these medicines showed that the patient feels a lot better, she is no longer nauseous, is starting to have an appetite, and has not had an episode of diarrhea since arrival.  She is now able to lie down on the bed comfortably and her abdominal pain is very much improved. Will discharge her home with antiemetics, antidiarrheals and antacids with close follow-up labs with PCP I have reviewed the patients home medicines and have made adjustments as needed   Consultations Obtained:  No consultations were obtained   Social Determinants of Health:  Supported by her husband   Test / Admission - Considered:  Patient feels well to go home and has improved medications.  Ruled out acute pathology.  Will have close follow-up with PCP.  No admission at this time      Final diagnoses:  Acute alcoholic gastritis without hemorrhage  Epigastric pain  Abdominal pain of multiple sites  Nausea and vomiting, unspecified vomiting type    ED Discharge Orders          Ordered    ondansetron  (ZOFRAN ) 4 MG tablet  Every 8 hours PRN        01/21/24 1608    dicyclomine (BENTYL) 20 MG tablet  2 times daily        01/21/24 1608    pantoprazole  (PROTONIX ) 40 MG tablet  Daily        01/21/24 1608               Charmayne Holmes, DO 01/21/24 1613    Dreama Longs, MD 01/21/24 2230

## 2024-01-21 NOTE — Discharge Instructions (Addendum)
 Thank you for allowing us  to be part of your care.  Your workup in the ED indicates that you likely have acute alcohol induced hepatitis and associated gastritis.  We gave you antinausea medications as well as pain medications.  I sent in Zofran  to your pharmacy for you to take every 8 hours as needed for nausea.  I also sent in Bentyl  to your pharmacy for you to take twice daily for diarrhea.  Lastly I sent 10 pantoprazole  also known as Protonix  to your pharmacy for you to take 1 tablet daily for 2 weeks.  Please abstain from drinking alcohol and follow-up with your primary care provider within 7 days of your emergency department physician to follow-up your labs.  Please return to the emergency department if your symptoms acutely worsen.  Also return if you start seeing or hearing things that are not there, if you start tremoring, or if you start sweating inappropriately.

## 2024-01-26 ENCOUNTER — Other Ambulatory Visit: Payer: Self-pay | Admitting: Family

## 2024-01-26 ENCOUNTER — Encounter: Payer: Self-pay | Admitting: Family

## 2024-01-26 DIAGNOSIS — F411 Generalized anxiety disorder: Secondary | ICD-10-CM

## 2024-01-26 MED ORDER — HYDROXYZINE HCL 25 MG PO TABS: ORAL_TABLET | ORAL | 0 refills | Status: AC

## 2024-02-19 ENCOUNTER — Telehealth: Payer: Self-pay | Admitting: Family

## 2024-02-19 NOTE — Telephone Encounter (Signed)
 Spoke with pt and she is going to get these forms along with short term disability forms to us  to fill out for her.

## 2024-02-19 NOTE — Telephone Encounter (Signed)
 Copied from CRM #[id number]. Topic: Clinical - Medical Advice >> Feb 19, 2024 10:32 AM Charolett L wrote: Reason for CRM: Patient just recently lost her son and is needing FMLA papers and is requesting a call back

## 2024-02-21 ENCOUNTER — Other Ambulatory Visit: Payer: Self-pay | Admitting: Family

## 2024-02-23 ENCOUNTER — Telehealth: Payer: Self-pay | Admitting: Family

## 2024-02-23 NOTE — Telephone Encounter (Signed)
 Copied from CRM #[id number]. Topic: Clinical - Medication Question >> Feb 23, 2024  9:30 AM Amy B wrote: Reason for CRM:  Patient requests to be prescribed losartan  (COZAAR ) 100 MG tablet.  She has been on this in the past and states it has been the only medication that has worked for her.

## 2024-02-24 ENCOUNTER — Telehealth: Payer: Self-pay | Admitting: Family

## 2024-02-24 NOTE — Telephone Encounter (Signed)
 Spoke with pt. She has scheduled an appointment with Tabitha for 02/27/24 at 0900. Pt states that she picked up hydroxyzine  but it knocks her out. Pt is requesting to have something sent in for depression. She also states that she needs losartan  sent in. Advised her that Ginger was not comfortable changing her BP meds again without seeing her in office.

## 2024-02-24 NOTE — Telephone Encounter (Signed)
 Pt will need appt We can discuss options again and she needs the appt for the FMLA we need documentation

## 2024-02-24 NOTE — Telephone Encounter (Signed)
 Copied from CRM [id number]. Topic: Clinical - Refused Triage >> Feb 24, 2024  8:38 AM Mia F wrote: Patient/caller voiced complaints of Pt mentions she is dealing with anxiety and depression from losing her son. She say she hasn't been seen for it but her pcp is aware of it. Declined transfer to triage.

## 2024-02-27 ENCOUNTER — Ambulatory Visit (INDEPENDENT_AMBULATORY_CARE_PROVIDER_SITE_OTHER): Admitting: Family

## 2024-02-27 VITALS — BP 136/78 | HR 106 | Temp 97.9°F | Ht 66.0 in | Wt 234.0 lb

## 2024-02-27 DIAGNOSIS — F4321 Adjustment disorder with depressed mood: Secondary | ICD-10-CM | POA: Diagnosis not present

## 2024-02-27 DIAGNOSIS — I10 Essential (primary) hypertension: Secondary | ICD-10-CM

## 2024-02-27 DIAGNOSIS — F331 Major depressive disorder, recurrent, moderate: Secondary | ICD-10-CM

## 2024-02-27 DIAGNOSIS — Z202 Contact with and (suspected) exposure to infections with a predominantly sexual mode of transmission: Secondary | ICD-10-CM | POA: Diagnosis not present

## 2024-02-27 DIAGNOSIS — F419 Anxiety disorder, unspecified: Secondary | ICD-10-CM

## 2024-02-27 MED ORDER — BUSPIRONE HCL 5 MG PO TABS: 5.0000 mg | ORAL_TABLET | Freq: Two times a day (BID) | ORAL | 2 refills | Status: AC

## 2024-02-27 MED ORDER — LOSARTAN POTASSIUM-HCTZ 100-25 MG PO TABS: 1.0000 | ORAL_TABLET | Freq: Every day | ORAL | 0 refills | Status: DC

## 2024-02-27 NOTE — Patient Instructions (Addendum)
" ° °------------------------------------ ° °  Start buspirone  5 mg twice daily for anxiety and depression.    Taking the medicine as directed and not missing any doses is one of the best things you can do to treat your buspirone  anxiety/depression.  Here are some things to keep in mind:  Side effects (stomach upset, some increased anxiety) may happen before you notice a benefit.  These side effects typically go away over time. Changes to your dose of medicine or a change in medication all together is sometimes necessary Many people will notice an improvement within two weeks but the full effect of the medication can take up to 4-6 weeks Stopping the medication when you start feeling better often results in a return of symptoms. Most people need to be on medication at least 6-12 months If you start having thoughts of hurting yourself or others after starting this medicine, please call me immediately.    ------------------------------------  Start losartan  hydrochlorothiazide once daily   ------------------------------------  Start monitoring your blood pressure daily, around the same time of day, for the next 2-3 weeks.  Ensure that you have rested for 30 minutes prior to checking your blood pressure. Record your readings and bring them to your next visit.  ------------------------------------  [facility] Phone # [phone] They have locations in [place], [place], [place], and [place]. They do take [employer] I know for sure and the [employer].   [facility] [phone]  [address] [place], [place], [place] [address]    [facility] [employer] [address] [place], [place] [address]   [phone]   [facility]- [phone]   [place] [phone]   [facility]- [phone]   If at any time you feel your needs are more urgent or you are concerned for your well being please see the below options:  For walk in options for  mental health:   [facility] [address] in [place], [place]. Call our 24-hour HelpLine at [phone] or [phone] for immediate assistance for mental health and substance abuse issues.  And or walk into [facility] hospital ER.   [employer] [facility]: 1-800-SUICIDE  [employer] Suicide Prevention Lifeline: 1-800-273-TALK  Regards,   Ginger Patrick FNP-C  "

## 2024-02-27 NOTE — Progress Notes (Signed)
 "  Established Patient Office Visit  Subjective:      CC:  Chief Complaint  Patient presents with   Hypertension    Started losartan . States readings at home are averaging 160s/90s. Forgot her log.    FMLA Paperwork    Discuss options for time off.     HPI: Tammy Decker is a 53 y.o. female presenting on 02/27/2024 for Hypertension (Started losartan . States readings at home are averaging 160s/90s. Forgot her log. ) and FMLA Paperwork (Discuss options for time off. ) .  Discussed the use of AI scribe software for clinical note transcription with the patient, who gave verbal consent to proceed.  History of Present Illness Tammy Decker is a 53 year old female with hypertension who presents with uncontrolled blood pressure and recent personal trauma.  She has a history of hypertension and reports elevated blood pressure readings. She was previously on valsartan  and lisinopril  but experienced flushing with these medications. She has returned to losartan  at a dose of 100 mg and reports blood pressure readings around 160/70 mmHg over the past few weeks. She does not rest before taking her blood pressure readings and reports some fluid retention.  She is experiencing significant emotional distress following the loss of her son, Gini, on January 25, 2024. Since his passing, she has been experiencing depressive episodes, tearfulness, anxiety, and worry, particularly due to the lack of leads in the investigation of his death. She feels unsafe and suspicious of others, struggling with the trauma of the event and its aftermath, including the details of the autopsy and funeral arrangements.  She is also dealing with marital issues, having discovered her husband's infidelity. She found out about his ongoing contact with another woman, which has added to her emotional burden. She is considering leaving her current environment to process her grief and marital issues.  She reports  ongoing urinary symptoms, including pelvic pain, but no discharge. She has a history of serum positive herpes type 2 and is concerned about potential STDs due to her husband's infidelity.  She has been using hydroxyzine  for anxiety but finds it makes her too sleepy. She has not tried SSRIs or other antidepressants before. She has not worked full-time since her son's death, attempting only a few half-days. She is considering taking more time off work to focus on her mental health and possibly relocating to be closer to her family.      02/27/2024    9:05 AM 10/29/2023    1:05 PM 10/09/2023    9:08 AM  PHQ9 SCORE ONLY  PHQ-9 Total Score 21 0 0      Data saved with a previous flowsheet row definition      02/27/2024    9:07 AM 10/29/2023    1:05 PM 08/29/2023   11:10 AM 02/03/2023   11:43 AM  GAD 7 : Generalized Anxiety Score  Nervous, Anxious, on Edge 3 0 3 0  Control/stop worrying 3 0 0 0  Worry too much - different things 3 0 0 0  Trouble relaxing 3 0 0 0  Restless 3 0 0 0  Easily annoyed or irritable 3 0 3 0  Afraid - awful might happen 3 0 0 0  Total GAD 7 Score 21 0 6 0  Anxiety Difficulty Very difficult   Not difficult at all           Social history:  Relevant past medical, surgical, family and social history reviewed and updated as indicated.  Interim medical history since our last visit reviewed.  Allergies and medications reviewed and updated.  DATA REVIEWED: CHART IN EPIC     ROS: Negative unless specifically indicated above in HPI.   Current Medications[1]        Objective:        BP 136/78 (BP Location: Right Arm, Patient Position: Sitting, Cuff Size: Large)   Pulse (!) 106   Temp 97.9 F (36.6 C) (Oral)   Ht 5' 6 (1.676 m)   Wt 234 lb (106.1 kg)   SpO2 97%   BMI 37.77 kg/m   Physical Exam VITALS: BP- 136/78  Wt Readings from Last 3 Encounters:  02/27/24 234 lb (106.1 kg)  10/29/23 225 lb 6 oz (102.2 kg)  10/09/23 226 lb 14.4 oz  (102.9 kg)    Physical Exam Constitutional:      General: She is not in acute distress.    Appearance: Normal appearance. She is normal weight. She is not ill-appearing, toxic-appearing or diaphoretic.  HENT:     Head: Normocephalic.  Cardiovascular:     Rate and Rhythm: Normal rate and regular rhythm.  Pulmonary:     Effort: Pulmonary effort is normal.     Breath sounds: Normal breath sounds.  Musculoskeletal:        General: Normal range of motion.  Neurological:     General: No focal deficit present.     Mental Status: She is alert and oriented to person, place, and time. Mental status is at baseline.  Psychiatric:        Attention and Perception: Attention and perception normal.        Mood and Affect: Mood normal. Affect is not tearful.        Behavior: Behavior normal. Behavior is cooperative.        Thought Content: Thought content normal. Thought content does not include homicidal or suicidal ideation. Thought content does not include homicidal or suicidal plan.        Cognition and Memory: Cognition and memory normal.        Judgment: Judgment normal.          Results   Assessment & Plan:   Assessment and Plan Assessment & Plan Major depressive disorder, recurrent, moderate Experiencing significant depressive episodes, tearfulness, anxiety, and worry following the loss of her son. Stress exacerbated by husband's infidelity. No current therapy or medication for depression. Discussed potential benefits of therapy and medication. Buspirone  considered due to minimal side effects and no weight gain. SSRIs like Zoloft discussed as alternatives if buspirone  is not tolerated. Emphasized importance of therapy for processing grief and making decisions. - Started buspirone  5 mg twice daily  - Referred to therapy for counseling and support. - Will consider Zoloft if buspirone  is not tolerated. -FMLA start date December 19th 2025  FMLA potential RTW date January  27th -intermittent upon return 1-3 times a week 24 hour periods at a time -suspect will need reduced work schedule Plan is to establish with a psychologist Trial buspirone  twice daily If this causes undesired side effects consider zoloft 50 mg once daily   Adjustment disorder with depressed mood (grieving) Grieving the loss of her son, compounded by husband's infidelity. Experiencing significant emotional distress and difficulty processing grief. Discussed potential benefits of therapy and medication. Emphasized importance of self-care and making decisions that prioritize her well-being. - Referred to therapy for counseling and support.  Essential hypertension Blood pressure not well controlled on losartan  100 mg, with readings around  160/70s. Previous valsartan  use caused flushing. Discussed adding hydrochlorothiazide to losartan  for better control. Advised to sit for 5-10 minutes before checking blood pressure for accurate readings. - Prescribed losartan -hydrochlorothiazide combination pill. - Advised to sit for 5-10 minutes before checking blood pressure.  Assessment and screening for sexually transmitted infections Concerns about potential exposure to STIs due to husband's infidelity. No symptoms reported, but screening recommended for peace of mind. Discussed testing for hepatitis, syphilis, HIV, chlamydia, gonorrhea, and trichomoniasis. - Ordered blood tests for hepatitis, syphilis, and HIV. - Ordered urine test for chlamydia, gonorrhea, and trichomoniasis.        Return in about 3 weeks (around 03/19/2024) for f/u FMLA.     Ginger Patrick, MSN, APRN, FNP-C [facility]        [1]  Current Outpatient Medications:    busPIRone  (BUSPAR ) 5 MG tablet, Take 1 tablet (5 mg total) by mouth 2 (two) times daily., Disp: 60 tablet, Rfl: 2   clobetasol  ointment (TEMOVATE ) 0.05 %, Apply 1 Application topically 2 (two) times daily. twice daily to lower legs x  2 weeks on and 2 weeks off until clear when condition is flared., Disp: 60 g, Rfl: 1   cyanocobalamin  (VITAMIN B12) 1000 MCG/ML injection, Inject 1 ml 1000 mcg total into muscle once a week for three weeks then once a month for three months, Disp: 4 mL, Rfl: 3   EPINEPHrine  0.3 mg/0.3 mL IJ SOAJ injection, Inject 0.3 mg into the muscle as needed for anaphylaxis., Disp: 1 each, Rfl: 1   hydrOXYzine  (ATARAX ) 25 MG tablet, Take 1-2 tablets tid prn anxiety, Disp: 30 tablet, Rfl: 0   losartan -hydrochlorothiazide (HYZAAR) 100-25 MG tablet, Take 1 tablet by mouth daily., Disp: 90 tablet, Rfl: 0   LUER LOCK SAFETY SYRINGES 25G X 1 3 ML MISC, USE TO INJECT B12 INTO THE MUSCLE ONCE WEEKLY FOR 4 WEEK THEN USE ONCE MONTHLY FOR 3 MONTHS, Disp: 7 each, Rfl: 0   pantoprazole  (PROTONIX ) 40 MG tablet, Take 1 tablet (40 mg total) by mouth daily for 15 days., Disp: 15 tablet, Rfl: 0  "

## 2024-02-28 LAB — HIV ANTIBODY (ROUTINE TESTING W REFLEX)
HIV 1&2 Ab, 4th Generation: NONREACTIVE
HIV FINAL INTERPRETATION: NEGATIVE

## 2024-02-28 LAB — HEPATITIS C ANTIBODY: Hepatitis C Ab: NONREACTIVE

## 2024-02-28 LAB — SYPHILIS: RPR W/REFLEX TO RPR TITER AND TREPONEMAL ANTIBODIES, TRADITIONAL SCREENING AND DIAGNOSIS ALGORITHM: RPR Ser Ql: NONREACTIVE

## 2024-02-29 ENCOUNTER — Ambulatory Visit: Payer: Self-pay | Admitting: Family

## 2024-02-29 LAB — CHLAMYDIA/GONOCOCCUS/TRICHOMONAS, NAA
Chlamydia by NAA: NEGATIVE
Gonococcus by NAA: NEGATIVE
Trich vag by NAA: NEGATIVE

## 2024-03-01 ENCOUNTER — Telehealth: Payer: Self-pay

## 2024-03-01 NOTE — Telephone Encounter (Signed)
 Received [employer] forms partiality completed by patient. Received FMLA forms  Pt is asking for continuous leave from 12.19.25-1.27.26 for grieving and counseling  Pt is asking for intermittent leave of 1-3x a week lasting up to 24hrs, upon returning to work for grief/counseling.  Pt asks to be notified when forms are complete.  [employer] forms can be faxed to [phone] when complete.  FMLA forms will need to be picked up by patient and given to employer as no fax number was given.  Forms placed in providers box for review.

## 2024-03-02 NOTE — Telephone Encounter (Unsigned)
 Copied from CRM #[id number]. Topic: General - Other >> Mar 02, 2024 10:52 AM Brittany M wrote: Reason for CRM: Patient calling to get an update on FMLA forms that were needing to be filled out.

## 2024-03-05 NOTE — Telephone Encounter (Signed)
 Completed forms received, [employer] forms faxed to [phone]. FMLA forms faxed to Krystal Roers at [phone]  Copies sent to scan  Patient notified via phone call that forms are ready to pick up at the front desk.

## 2024-03-05 NOTE — Telephone Encounter (Signed)
 Copied from CRM [id number]. Topic: General - Other >> Mar 05, 2024  2:17 PM Deleta RAMAN wrote: Reason for CRM: would like to give E-fax to doctors office for fmla paperwork E fax [phone]. Please send fmla paper work to above fax for employment

## 2024-03-05 NOTE — Telephone Encounter (Signed)
 Completed FMLA in outbox

## 2024-03-10 ENCOUNTER — Telehealth: Payer: Self-pay | Admitting: Family

## 2024-03-10 NOTE — Telephone Encounter (Signed)
 Copied from CRM [id number]. Topic: General - Other >> Mar 10, 2024 10:37 AM Berneda FALCON wrote: Reason for CRM: Patient called in stating that her application for short-term disability was denied by employer stating that her condition was not a physical ailment, but a mental one, due to the loss of her child in November and they only cover physical. As a result of this denial, patient has no choice but to return to work, and would like a note permitting her return to work on Monday 03/15/24, to avoid adding financial  burden to her situation. Is this something the patient would require an appt to be approved to return to work, or can we just provide the documentation for her via MyChart? If she needs an appt, can we work her in this week?  Patient callback is [phone] (home)

## 2024-03-11 ENCOUNTER — Encounter: Payer: Self-pay | Admitting: Family

## 2024-03-12 NOTE — Telephone Encounter (Signed)
 Is there an FMLA that we could print that I cFan update with the intermittent FMLA or at least check if there is some in place already I'll likely have to update the intention to rtw part time

## 2024-03-16 ENCOUNTER — Encounter: Payer: Self-pay | Admitting: Family

## 2024-03-16 ENCOUNTER — Ambulatory Visit: Admitting: Dermatology

## 2024-03-16 NOTE — Telephone Encounter (Signed)
 Yes. agree

## 2024-03-16 NOTE — Telephone Encounter (Signed)
 Spoke with Tabitha on the phone, she is okay with updating paperwork for a return to work date of 03/18/24. Paperwork has been updated and faxed. Pt is aware. She asked for a copy of her forms to be mailed to her home address.

## 2024-03-21 ENCOUNTER — Inpatient Hospital Stay (HOSPITAL_COMMUNITY)
Admission: EM | Admit: 2024-03-21 | Discharge: 2024-03-24 | DRG: 439 | Disposition: A | Discharge status: Home / Self Care | Payer: Self-pay | Attending: Student | Admitting: Student

## 2024-03-21 ENCOUNTER — Emergency Department (HOSPITAL_COMMUNITY): Payer: Self-pay

## 2024-03-21 ENCOUNTER — Other Ambulatory Visit: Payer: Self-pay

## 2024-03-21 ENCOUNTER — Encounter (HOSPITAL_COMMUNITY): Payer: Self-pay

## 2024-03-21 DIAGNOSIS — F32A Depression, unspecified: Secondary | ICD-10-CM | POA: Diagnosis present

## 2024-03-21 DIAGNOSIS — F17210 Nicotine dependence, cigarettes, uncomplicated: Secondary | ICD-10-CM | POA: Diagnosis present

## 2024-03-21 DIAGNOSIS — K219 Gastro-esophageal reflux disease without esophagitis: Secondary | ICD-10-CM | POA: Diagnosis present

## 2024-03-21 DIAGNOSIS — F419 Anxiety disorder, unspecified: Secondary | ICD-10-CM | POA: Diagnosis present

## 2024-03-21 DIAGNOSIS — K859 Acute pancreatitis without necrosis or infection, unspecified: Secondary | ICD-10-CM | POA: Diagnosis present

## 2024-03-21 DIAGNOSIS — Z634 Disappearance and death of family member: Secondary | ICD-10-CM

## 2024-03-21 DIAGNOSIS — K2980 Duodenitis without bleeding: Secondary | ICD-10-CM | POA: Diagnosis present

## 2024-03-21 DIAGNOSIS — Z833 Family history of diabetes mellitus: Secondary | ICD-10-CM

## 2024-03-21 DIAGNOSIS — Z8249 Family history of ischemic heart disease and other diseases of the circulatory system: Secondary | ICD-10-CM

## 2024-03-21 DIAGNOSIS — J4520 Mild intermittent asthma, uncomplicated: Secondary | ICD-10-CM | POA: Diagnosis present

## 2024-03-21 DIAGNOSIS — N3000 Acute cystitis without hematuria: Secondary | ICD-10-CM | POA: Diagnosis present

## 2024-03-21 DIAGNOSIS — Z716 Tobacco abuse counseling: Secondary | ICD-10-CM

## 2024-03-21 DIAGNOSIS — Z8041 Family history of malignant neoplasm of ovary: Secondary | ICD-10-CM

## 2024-03-21 DIAGNOSIS — Z882 Allergy status to sulfonamides status: Secondary | ICD-10-CM

## 2024-03-21 DIAGNOSIS — Z91014 Allergy to mammalian meats: Secondary | ICD-10-CM

## 2024-03-21 DIAGNOSIS — Z79899 Other long term (current) drug therapy: Secondary | ICD-10-CM

## 2024-03-21 DIAGNOSIS — K760 Fatty (change of) liver, not elsewhere classified: Secondary | ICD-10-CM | POA: Diagnosis present

## 2024-03-21 DIAGNOSIS — B9620 Unspecified Escherichia coli [E. coli] as the cause of diseases classified elsewhere: Secondary | ICD-10-CM | POA: Diagnosis present

## 2024-03-21 DIAGNOSIS — F4321 Adjustment disorder with depressed mood: Secondary | ICD-10-CM | POA: Diagnosis present

## 2024-03-21 DIAGNOSIS — I10 Essential (primary) hypertension: Secondary | ICD-10-CM | POA: Diagnosis present

## 2024-03-21 DIAGNOSIS — Z800 Family history of malignant neoplasm of digestive organs: Secondary | ICD-10-CM

## 2024-03-21 DIAGNOSIS — K852 Alcohol induced acute pancreatitis without necrosis or infection: Principal | ICD-10-CM | POA: Diagnosis present

## 2024-03-21 DIAGNOSIS — F1010 Alcohol abuse, uncomplicated: Secondary | ICD-10-CM | POA: Diagnosis present

## 2024-03-21 DIAGNOSIS — Z881 Allergy status to other antibiotic agents status: Secondary | ICD-10-CM

## 2024-03-21 DIAGNOSIS — Z91018 Allergy to other foods: Secondary | ICD-10-CM

## 2024-03-21 DIAGNOSIS — Z888 Allergy status to other drugs, medicaments and biological substances status: Secondary | ICD-10-CM

## 2024-03-21 DIAGNOSIS — Z87892 Personal history of anaphylaxis: Secondary | ICD-10-CM

## 2024-03-21 DIAGNOSIS — F10139 Alcohol abuse with withdrawal, unspecified: Secondary | ICD-10-CM | POA: Diagnosis present

## 2024-03-21 LAB — COMPREHENSIVE METABOLIC PANEL WITH GFR
ALT: 78 U/L — ABNORMAL HIGH (ref 0–44)
AST: 75 U/L — ABNORMAL HIGH (ref 15–41)
Albumin: 4.2 g/dL (ref 3.5–5.0)
Alkaline Phosphatase: 93 U/L (ref 38–126)
Anion gap: 15 (ref 5–15)
BUN: 11 mg/dL (ref 6–20)
CO2: 21 mmol/L — ABNORMAL LOW (ref 22–32)
Calcium: 8.8 mg/dL — ABNORMAL LOW (ref 8.9–10.3)
Chloride: 103 mmol/L (ref 98–111)
Creatinine, Ser: 0.58 mg/dL (ref 0.44–1.00)
GFR, Estimated: >60 mL/min
Glucose, Bld: 80 mg/dL (ref 70–99)
Potassium: 3.8 mmol/L (ref 3.5–5.1)
Sodium: 138 mmol/L (ref 135–145)
Total Bilirubin: 0.5 mg/dL (ref 0.0–1.2)
Total Protein: 7.9 g/dL (ref 6.5–8.1)

## 2024-03-21 LAB — URINALYSIS, ROUTINE W REFLEX MICROSCOPIC
Bilirubin Urine: NEGATIVE
Glucose, UA: NEGATIVE mg/dL
Ketones, ur: 20 mg/dL — AB
Nitrite: POSITIVE — AB
Protein, ur: 30 mg/dL — AB
Specific Gravity, Urine: 1.021 (ref 1.005–1.030)
pH: 5 (ref 5.0–8.0)

## 2024-03-21 LAB — CBC WITH DIFFERENTIAL/PLATELET
Abs Immature Granulocytes: 0.02 K/uL (ref 0.00–0.07)
Basophils Absolute: 0.1 K/uL (ref 0.0–0.1)
Basophils Relative: 1 %
Eosinophils Absolute: 0.1 K/uL (ref 0.0–0.5)
Eosinophils Relative: 1 %
HCT: 44.4 % (ref 36.0–46.0)
Hemoglobin: 15.4 g/dL — ABNORMAL HIGH (ref 12.0–15.0)
Immature Granulocytes: 0 %
Lymphocytes Relative: 21 %
Lymphs Abs: 2.3 K/uL (ref 0.7–4.0)
MCH: 34.6 pg — ABNORMAL HIGH (ref 26.0–34.0)
MCHC: 34.7 g/dL (ref 30.0–36.0)
MCV: 99.8 fL (ref 80.0–100.0)
Monocytes Absolute: 0.7 K/uL (ref 0.1–1.0)
Monocytes Relative: 6 %
Neutro Abs: 7.7 K/uL (ref 1.7–7.7)
Neutrophils Relative %: 71 %
Platelets: 224 K/uL (ref 150–400)
RBC: 4.45 MIL/uL (ref 3.87–5.11)
RDW: 12.9 % (ref 11.5–15.5)
WBC: 10.8 K/uL — ABNORMAL HIGH (ref 4.0–10.5)
nRBC: 0 % (ref 0.0–0.2)

## 2024-03-21 LAB — LIPASE, BLOOD: Lipase: 1115 U/L — ABNORMAL HIGH (ref 11–51)

## 2024-03-21 MED ORDER — LACTATED RINGERS IV BOLUS: 1000.0000 mL | Freq: Once | INTRAVENOUS | Status: DC

## 2024-03-21 MED ORDER — HYDROMORPHONE HCL 1 MG/ML IJ SOLN
1.0000 mg | Freq: Once | INTRAMUSCULAR | Status: AC
  Administered 2024-03-22: 1 mg via INTRAVENOUS
  Filled 2024-03-21: qty 1

## 2024-03-21 MED ORDER — LORAZEPAM 2 MG/ML IJ SOLN
1.0000 mg | INTRAMUSCULAR | Status: DC | PRN
  Administered 2024-03-22 (×2): 1 mg via INTRAVENOUS
  Filled 2024-03-21 (×2): qty 1

## 2024-03-21 MED ORDER — ONDANSETRON HCL 4 MG/2ML IJ SOLN
4.0000 mg | Freq: Once | INTRAMUSCULAR | Status: AC
  Administered 2024-03-21: 4 mg via INTRAVENOUS
  Filled 2024-03-21: qty 2

## 2024-03-21 MED ORDER — FOLIC ACID 1 MG PO TABS
1.0000 mg | ORAL_TABLET | Freq: Every day | ORAL | Status: DC
  Administered 2024-03-22 – 2024-03-24 (×3): 1 mg via ORAL
  Filled 2024-03-21 (×3): qty 1

## 2024-03-21 MED ORDER — LORAZEPAM 1 MG PO TABS
1.0000 mg | ORAL_TABLET | ORAL | Status: DC | PRN
  Administered 2024-03-22 – 2024-03-24 (×4): 1 mg via ORAL
  Filled 2024-03-21 (×4): qty 1

## 2024-03-21 MED ORDER — THIAMINE HCL 100 MG/ML IJ SOLN: 100.0000 mg | Freq: Every day | INTRAMUSCULAR | Status: DC

## 2024-03-21 MED ORDER — THIAMINE MONONITRATE 100 MG PO TABS
100.0000 mg | ORAL_TABLET | Freq: Every day | ORAL | Status: DC
  Administered 2024-03-22 – 2024-03-24 (×3): 100 mg via ORAL
  Filled 2024-03-21 (×3): qty 1

## 2024-03-21 MED ORDER — SODIUM CHLORIDE 0.9 % IV BOLUS
1000.0000 mL | Freq: Once | INTRAVENOUS | Status: AC
  Administered 2024-03-21: 1000 mL via INTRAVENOUS

## 2024-03-21 MED ORDER — MORPHINE SULFATE (PF) 4 MG/ML IV SOLN
4.0000 mg | Freq: Once | INTRAVENOUS | Status: AC
  Administered 2024-03-21: 4 mg via INTRAVENOUS
  Filled 2024-03-21: qty 1

## 2024-03-21 MED ORDER — IOHEXOL 300 MG/ML  SOLN
100.0000 mL | Freq: Once | INTRAMUSCULAR | Status: AC | PRN
  Administered 2024-03-21: 100 mL via INTRAVENOUS

## 2024-03-21 MED ORDER — ADULT MULTIVITAMIN W/MINERALS CH
1.0000 | ORAL_TABLET | Freq: Every day | ORAL | Status: DC
  Administered 2024-03-22 – 2024-03-24 (×3): 1 via ORAL
  Filled 2024-03-21 (×3): qty 1

## 2024-03-21 MED ORDER — SODIUM CHLORIDE 0.9 % IV SOLN
1.0000 g | Freq: Once | INTRAVENOUS | Status: AC
  Administered 2024-03-21: 1 g via INTRAVENOUS
  Filled 2024-03-21: qty 10

## 2024-03-21 NOTE — ED Provider Notes (Signed)
 " [facility] Provider Note   [name]: [id number] Arrival date & time: 03/21/24  2027     Patient presents with: Abdominal Pain   Tammy Decker is a 54 y.o. female.  {Add pertinent medical, surgical, social history, OB history to HPI:32947}  Abdominal Pain      Prior to Admission medications  Medication Sig Start Date End Date Taking? Authorizing Provider  busPIRone  (BUSPAR ) 5 MG tablet Take 1 tablet (5 mg total) by mouth 2 (two) times daily. 02/27/24   Corwin Antu, FNP  clobetasol  ointment (TEMOVATE ) 0.05 % Apply 1 Application topically 2 (two) times daily. twice daily to lower legs x 2 weeks on and 2 weeks off until clear when condition is flared. 10/08/23   Alm Delon SAILOR, DO  cyanocobalamin  (VITAMIN B12) 1000 MCG/ML injection Inject 1 ml 1000 mcg total into muscle once a week for three weeks then once a month for three months 01/09/23   Corwin Antu, FNP  EPINEPHrine  0.3 mg/0.3 mL IJ SOAJ injection Inject 0.3 mg into the muscle as needed for anaphylaxis. 02/13/23   Dugal, Tabitha, FNP  hydrOXYzine  (ATARAX ) 25 MG tablet Take 1-2 tablets tid prn anxiety 01/26/24   Dugal, Tabitha, FNP  losartan -hydrochlorothiazide (HYZAAR) 100-25 MG tablet Take 1 tablet by mouth daily. 02/27/24   Corwin Antu, FNP  LUER LOCK SAFETY SYRINGES 25G X 1 3 ML MISC USE TO INJECT B12 INTO THE MUSCLE ONCE WEEKLY FOR 4 WEEK THEN USE ONCE MONTHLY FOR 3 MONTHS 03/31/23   Corwin Antu, FNP  pantoprazole  (PROTONIX ) 40 MG tablet Take 1 tablet (40 mg total) by mouth daily for 15 days. 01/21/24 02/27/24  Charmayne Holmes, DO    Allergies: Ciprofloxacin, Kiwi extract, Metoprolol , Other, Propranolol , Sulfa antibiotics, and Valsartan     Review of Systems  Gastrointestinal:  Positive for abdominal pain.    Updated Vital Signs BP (!) 155/101   Pulse 90   Temp 98.5 F (36.9 C) (Oral)   Resp 18   SpO2 98%   Physical Exam  (all labs ordered are listed, but  only abnormal results are displayed) Labs Reviewed  COMPREHENSIVE METABOLIC PANEL WITH GFR - Abnormal; Notable for the following components:      Result Value   CO2 21 (*)    Calcium  8.8 (*)    AST 75 (*)    ALT 78 (*)    All other components within normal limits  LIPASE, BLOOD - Abnormal; Notable for the following components:   Lipase 1,115 (*)    All other components within normal limits  CBC WITH DIFFERENTIAL/PLATELET - Abnormal; Notable for the following components:   WBC 10.8 (*)    Hemoglobin 15.4 (*)    MCH 34.6 (*)    All other components within normal limits  URINALYSIS, ROUTINE W REFLEX MICROSCOPIC - Abnormal; Notable for the following components:   APPearance HAZY (*)    Hgb urine dipstick MODERATE (*)    Ketones, ur 20 (*)    Protein, ur 30 (*)    Nitrite POSITIVE (*)    Leukocytes,Ua TRACE (*)    Bacteria, UA MANY (*)    All other components within normal limits  URINE CULTURE    EKG: None  Radiology: CT ABDOMEN PELVIS W CONTRAST Result Date: 03/21/2024 EXAM: CT ABDOMEN AND PELVIS WITH CONTRAST 03/21/2024 11:26:50 PM TECHNIQUE: CT of the abdomen and pelvis was performed with the administration of 100 mL of iohexol  (OMNIPAQUE ) 300 MG/ML solution. Multiplanar reformatted images  are provided for review. Automated exposure control, iterative reconstruction, and/or weight-based adjustment of the mA/kV was utilized to reduce the radiation dose to as low as reasonably achievable. COMPARISON: Abdominal ultrasound 01/21/2024 and CT abdomen and pelvis 01/21/2024. CLINICAL HISTORY: Pancreatitis suspected. FINDINGS: LOWER CHEST: No acute abnormality. LIVER: Diffuse fatty infiltration of the liver and enlargement of the liver. GALLBLADDER AND BILE DUCTS: The gallbladder is surgically absent. No biliary ductal dilatation. SPLEEN: Calcified granulomas in the spleen. PANCREAS: Mild inflammatory stranding surrounding the pancreatic head. No evidence for fluid collection or ductal  dilatation. ADRENAL GLANDS: No acute abnormality. KIDNEYS, URETERS AND BLADDER: No stones in the kidneys or ureters. No hydronephrosis. No perinephric or periureteral stranding. Urinary bladder is unremarkable. GI AND BOWEL: Stomach demonstrates no acute abnormality. There is wall thickening and inflammation of the proximal duodenum. There is no evidence for perforation or abscess. The appendix is within normal limits. There is no bowel obstruction. PERITONEUM AND RETROPERITONEUM: No ascites. No free air. VASCULATURE: Aorta is normal in caliber. There are atherosclerotic calcifications of the aorta. LYMPH NODES: No lymphadenopathy. REPRODUCTIVE ORGANS: No acute abnormality. BONES AND SOFT TISSUES: No acute osseous abnormality. Thoracic spinal cord stimulator device present. No focal soft tissue abnormality. IMPRESSION: 1. Findings consistent with mild acute pancreatitis, without fluid collection or ductal dilatation. 2. Proximal duodenitis, without perforation or abscess. 3. Hepatomegaly with diffuse hepatic steatosis. Electronically signed by: Greig Pique MD 03/21/2024 11:34 PM EST RP Workstation: [id number]    {Document cardiac monitor, telemetry assessment procedure when appropriate:32947} Procedures   Medications Ordered in the ED  sodium chloride  0.9 % bolus 1,000 mL (has no administration in time range)  cefTRIAXone  (ROCEPHIN ) 1 g in sodium chloride  0.9 % 100 mL IVPB (has no administration in time range)  LORazepam  (ATIVAN ) tablet 1-4 mg (has no administration in time range)    Or  LORazepam  (ATIVAN ) injection 1-4 mg (has no administration in time range)  thiamine  (VITAMIN B1) tablet 100 mg (has no administration in time range)    Or  thiamine  (VITAMIN B1) injection 100 mg (has no administration in time range)  folic acid  (FOLVITE ) tablet 1 mg (has no administration in time range)  multivitamin with minerals tablet 1 tablet (has no administration in time range)  iohexol  (OMNIPAQUE ) 300 MG/ML  solution 100 mL (100 mLs Intravenous Contrast Given 03/21/24 2316)  ondansetron  (ZOFRAN ) injection 4 mg (4 mg Intravenous Given 03/21/24 2257)  morphine  (PF) 4 MG/ML injection 4 mg (4 mg Intravenous Given 03/21/24 2256)      {Click here for ABCD2, HEART and other calculators REFRESH Note before signing:1}                              Medical Decision Making Amount and/or Complexity of Data Reviewed Labs: ordered. Radiology: ordered.  Risk OTC drugs. Prescription drug management.   ***  {Document critical care time when appropriate  Document review of labs and clinical decision tools ie CHADS2VASC2, etc  Document your independent review of radiology images and any outside records  Document your discussion with family members, caretakers and with consultants  Document social determinants of health affecting pt's care  Document your decision making why or why not admission, treatments were needed:32947:::1}   Final diagnoses:  None    ED Discharge Orders     None        "

## 2024-03-21 NOTE — ED Provider Notes (Incomplete)
 " [place] EMERGENCY DEPARTMENT AT [facility] Provider Note   [name]: [id number] Arrival date & time: 03/21/24  2027     Patient presents with: Abdominal Pain   Tammy Decker is a 54 y.o. female with h/o alcohol use disorder, pancreatitis presents to the ER today for evaluation of epigastric pain with nausea and vomiting since last night. She usually drinks 4-5 beers daily, but hasn't had any since last night.  Reports diarrhea as well.  She denies any hematemesis or coffee-ground emesis.  Denies any melena or hematochezia.  Her urine has been darker but no urinary symptoms.  Denies any fever or chills.  She reports this feels at the last time she had pancreatitis.  Denies any chest pain or shortness of breath.  Reports the pain radiates wrapping around to her back but not straight through.  Allergic to Cipro and sulfa.  Pack per day smoker.  Denies any illicit drug use.   Abdominal Pain Associated symptoms: diarrhea, nausea and vomiting   Associated symptoms: no chest pain, no chills, no constipation, no dysuria, no fever, no hematuria and no shortness of breath        Prior to Admission medications  Medication Sig Start Date End Date Taking? Authorizing Provider  busPIRone  (BUSPAR ) 5 MG tablet Take 1 tablet (5 mg total) by mouth 2 (two) times daily. 02/27/24   Corwin Antu, FNP  clobetasol  ointment (TEMOVATE ) 0.05 % Apply 1 Application topically 2 (two) times daily. twice daily to lower legs x 2 weeks on and 2 weeks off until clear when condition is flared. 10/08/23   Alm Delon SAILOR, DO  cyanocobalamin  (VITAMIN B12) 1000 MCG/ML injection Inject 1 ml 1000 mcg total into muscle once a week for three weeks then once a month for three months 01/09/23   Corwin Antu, FNP  EPINEPHrine  0.3 mg/0.3 mL IJ SOAJ injection Inject 0.3 mg into the muscle as needed for anaphylaxis. 02/13/23   Dugal, Tabitha, FNP  hydrOXYzine  (ATARAX ) 25 MG tablet Take 1-2 tablets tid prn anxiety  01/26/24   Dugal, Tabitha, FNP  losartan -hydrochlorothiazide (HYZAAR) 100-25 MG tablet Take 1 tablet by mouth daily. 02/27/24   Corwin Antu, FNP  LUER LOCK SAFETY SYRINGES 25G X 1 3 ML MISC USE TO INJECT B12 INTO THE MUSCLE ONCE WEEKLY FOR 4 WEEK THEN USE ONCE MONTHLY FOR 3 MONTHS 03/31/23   Corwin Antu, FNP  pantoprazole  (PROTONIX ) 40 MG tablet Take 1 tablet (40 mg total) by mouth daily for 15 days. 01/21/24 02/27/24  Charmayne Holmes, DO    Allergies: Ciprofloxacin, Kiwi extract, Metoprolol , Other, Propranolol , Sulfa antibiotics, and Valsartan     Review of Systems  Constitutional:  Negative for chills and fever.  Respiratory:  Negative for shortness of breath.   Cardiovascular:  Negative for chest pain.  Gastrointestinal:  Positive for abdominal pain, diarrhea, nausea and vomiting. Negative for blood in stool and constipation.  Genitourinary:  Negative for dysuria and hematuria.    Updated Vital Signs BP (!) 155/101   Pulse 90   Temp 98.5 F (36.9 C) (Oral)   Resp 18   SpO2 98%   Physical Exam Vitals and nursing note reviewed.  Constitutional:      Appearance: She is not toxic-appearing.     Comments: Uncomfortable, but no acute distress  Eyes:     General: No scleral icterus. Cardiovascular:     Rate and Rhythm: Normal rate.  Pulmonary:     Effort: Pulmonary effort is normal. No respiratory distress.  Abdominal:     Palpations: Abdomen is soft.     Tenderness: There is abdominal tenderness in the epigastric area.     Comments: Epigastric tenderness to palpation.  Does have some slight abdominal distention but soft.  Skin:    General: Skin is warm and dry.  Neurological:     Mental Status: She is alert.     Motor: No tremor.     (all labs ordered are listed, but only abnormal results are displayed) Labs Reviewed  COMPREHENSIVE METABOLIC PANEL WITH GFR - Abnormal; Notable for the following components:      Result Value   CO2 21 (*)    Calcium  8.8 (*)    AST  75 (*)    ALT 78 (*)    All other components within normal limits  LIPASE, BLOOD - Abnormal; Notable for the following components:   Lipase 1,115 (*)    All other components within normal limits  CBC WITH DIFFERENTIAL/PLATELET - Abnormal; Notable for the following components:   WBC 10.8 (*)    Hemoglobin 15.4 (*)    MCH 34.6 (*)    All other components within normal limits  URINALYSIS, ROUTINE W REFLEX MICROSCOPIC - Abnormal; Notable for the following components:   APPearance HAZY (*)    Hgb urine dipstick MODERATE (*)    Ketones, ur 20 (*)    Protein, ur 30 (*)    Nitrite POSITIVE (*)    Leukocytes,Ua TRACE (*)    Bacteria, UA MANY (*)    All other components within normal limits  URINE CULTURE    EKG: None  Radiology: CT ABDOMEN PELVIS W CONTRAST Result Date: 03/21/2024 EXAM: CT ABDOMEN AND PELVIS WITH CONTRAST 03/21/2024 11:26:50 PM TECHNIQUE: CT of the abdomen and pelvis was performed with the administration of 100 mL of iohexol  (OMNIPAQUE ) 300 MG/ML solution. Multiplanar reformatted images are provided for review. Automated exposure control, iterative reconstruction, and/or weight-based adjustment of the mA/kV was utilized to reduce the radiation dose to as low as reasonably achievable. COMPARISON: Abdominal ultrasound 01/21/2024 and CT abdomen and pelvis 01/21/2024. CLINICAL HISTORY: Pancreatitis suspected. FINDINGS: LOWER CHEST: No acute abnormality. LIVER: Diffuse fatty infiltration of the liver and enlargement of the liver. GALLBLADDER AND BILE DUCTS: The gallbladder is surgically absent. No biliary ductal dilatation. SPLEEN: Calcified granulomas in the spleen. PANCREAS: Mild inflammatory stranding surrounding the pancreatic head. No evidence for fluid collection or ductal dilatation. ADRENAL GLANDS: No acute abnormality. KIDNEYS, URETERS AND BLADDER: No stones in the kidneys or ureters. No hydronephrosis. No perinephric or periureteral stranding. Urinary bladder is unremarkable.  GI AND BOWEL: Stomach demonstrates no acute abnormality. There is wall thickening and inflammation of the proximal duodenum. There is no evidence for perforation or abscess. The appendix is within normal limits. There is no bowel obstruction. PERITONEUM AND RETROPERITONEUM: No ascites. No free air. VASCULATURE: Aorta is normal in caliber. There are atherosclerotic calcifications of the aorta. LYMPH NODES: No lymphadenopathy. REPRODUCTIVE ORGANS: No acute abnormality. BONES AND SOFT TISSUES: No acute osseous abnormality. Thoracic spinal cord stimulator device present. No focal soft tissue abnormality. IMPRESSION: 1. Findings consistent with mild acute pancreatitis, without fluid collection or ductal dilatation. 2. Proximal duodenitis, without perforation or abscess. 3. Hepatomegaly with diffuse hepatic steatosis. Electronically signed by: Greig Pique MD 03/21/2024 11:34 PM EST RP Workstation: [id number]   Procedures   Medications Ordered in the ED  sodium chloride  0.9 % bolus 1,000 mL (has no administration in time range)  cefTRIAXone  (ROCEPHIN ) 1 g in sodium  chloride 0.9 % 100 mL IVPB (has no administration in time range)  LORazepam  (ATIVAN ) tablet 1-4 mg (has no administration in time range)    Or  LORazepam  (ATIVAN ) injection 1-4 mg (has no administration in time range)  thiamine  (VITAMIN B1) tablet 100 mg (has no administration in time range)    Or  thiamine  (VITAMIN B1) injection 100 mg (has no administration in time range)  folic acid  (FOLVITE ) tablet 1 mg (has no administration in time range)  multivitamin with minerals tablet 1 tablet (has no administration in time range)  iohexol  (OMNIPAQUE ) 300 MG/ML solution 100 mL (100 mLs Intravenous Contrast Given 03/21/24 2316)  ondansetron  (ZOFRAN ) injection 4 mg (4 mg Intravenous Given 03/21/24 2257)  morphine  (PF) 4 MG/ML injection 4 mg (4 mg Intravenous Given 03/21/24 2256)    Medical Decision Making Amount and/or Complexity of Data  Reviewed Labs: ordered. Radiology: ordered.  Risk OTC drugs. Prescription drug management. Decision regarding hospitalization.   54 y.o. female presents to the ER for evaluation of epigastric pain. Differential diagnosis includes but is not limited to PUD, gastritis, pancreatitis, gastroparesis, malignancy, biliary disease, ACS, pericarditis, pneumonia, intestinal ischemia, esophageal rupture, hepatitis. Vital signs elevated BP otherwise unremarkable. Physical exam as noted above.   I independently reviewed and interpreted the patient's labs. ***.  After consideration of the diagnostic results and the patients response to treatment, I feel that *** .   Emergency department workup does*** not suggest an emergent condition requiring admission or immediate intervention beyond what has been performed at this time.   ***We discussed the results of the labs/imaging. The plan is ***. We discussed strict return precautions and red flag symptoms. The patient verbalized their understanding and agrees to the plan. The patient is stable and being discharged home in good condition.  ***Portions of this report may have been transcribed using voice recognition software. Every effort was made to ensure accuracy; however, inadvertent computerized transcription errors may be present.    Final diagnoses:  None    ED Discharge Orders     None        "

## 2024-03-21 NOTE — ED Triage Notes (Addendum)
 Pt BIB EMS from home with reports of upper abdominal pain that wraps around both sides. Pt has been vomiting for 2 days.

## 2024-03-22 ENCOUNTER — Inpatient Hospital Stay (HOSPITAL_COMMUNITY): Payer: Self-pay

## 2024-03-22 DIAGNOSIS — N3000 Acute cystitis without hematuria: Secondary | ICD-10-CM | POA: Diagnosis present

## 2024-03-22 DIAGNOSIS — K859 Acute pancreatitis without necrosis or infection, unspecified: Secondary | ICD-10-CM | POA: Diagnosis present

## 2024-03-22 DIAGNOSIS — K219 Gastro-esophageal reflux disease without esophagitis: Secondary | ICD-10-CM | POA: Diagnosis present

## 2024-03-22 DIAGNOSIS — Z716 Tobacco abuse counseling: Secondary | ICD-10-CM | POA: Diagnosis not present

## 2024-03-22 DIAGNOSIS — K852 Alcohol induced acute pancreatitis without necrosis or infection: Secondary | ICD-10-CM | POA: Diagnosis present

## 2024-03-22 DIAGNOSIS — F32A Depression, unspecified: Secondary | ICD-10-CM | POA: Diagnosis present

## 2024-03-22 DIAGNOSIS — Z79899 Other long term (current) drug therapy: Secondary | ICD-10-CM | POA: Diagnosis not present

## 2024-03-22 DIAGNOSIS — Z87892 Personal history of anaphylaxis: Secondary | ICD-10-CM | POA: Diagnosis not present

## 2024-03-22 DIAGNOSIS — F10139 Alcohol abuse with withdrawal, unspecified: Secondary | ICD-10-CM | POA: Diagnosis present

## 2024-03-22 DIAGNOSIS — K760 Fatty (change of) liver, not elsewhere classified: Secondary | ICD-10-CM | POA: Diagnosis present

## 2024-03-22 DIAGNOSIS — K2980 Duodenitis without bleeding: Secondary | ICD-10-CM | POA: Diagnosis present

## 2024-03-22 DIAGNOSIS — Z634 Disappearance and death of family member: Secondary | ICD-10-CM | POA: Diagnosis not present

## 2024-03-22 DIAGNOSIS — F419 Anxiety disorder, unspecified: Secondary | ICD-10-CM | POA: Diagnosis present

## 2024-03-22 DIAGNOSIS — Z8041 Family history of malignant neoplasm of ovary: Secondary | ICD-10-CM | POA: Diagnosis not present

## 2024-03-22 DIAGNOSIS — J4520 Mild intermittent asthma, uncomplicated: Secondary | ICD-10-CM | POA: Diagnosis present

## 2024-03-22 DIAGNOSIS — F1010 Alcohol abuse, uncomplicated: Secondary | ICD-10-CM | POA: Diagnosis present

## 2024-03-22 DIAGNOSIS — Z800 Family history of malignant neoplasm of digestive organs: Secondary | ICD-10-CM | POA: Diagnosis not present

## 2024-03-22 DIAGNOSIS — F17210 Nicotine dependence, cigarettes, uncomplicated: Secondary | ICD-10-CM | POA: Diagnosis present

## 2024-03-22 DIAGNOSIS — Z833 Family history of diabetes mellitus: Secondary | ICD-10-CM | POA: Diagnosis not present

## 2024-03-22 DIAGNOSIS — B9620 Unspecified Escherichia coli [E. coli] as the cause of diseases classified elsewhere: Secondary | ICD-10-CM | POA: Diagnosis present

## 2024-03-22 DIAGNOSIS — Z8249 Family history of ischemic heart disease and other diseases of the circulatory system: Secondary | ICD-10-CM | POA: Diagnosis not present

## 2024-03-22 DIAGNOSIS — F4321 Adjustment disorder with depressed mood: Secondary | ICD-10-CM | POA: Diagnosis present

## 2024-03-22 DIAGNOSIS — I10 Essential (primary) hypertension: Secondary | ICD-10-CM | POA: Diagnosis present

## 2024-03-22 LAB — CBC
HCT: 40.7 % (ref 36.0–46.0)
HCT: 40.9 % (ref 36.0–46.0)
Hemoglobin: 13.8 g/dL (ref 12.0–15.0)
Hemoglobin: 14 g/dL (ref 12.0–15.0)
MCH: 34.4 pg — ABNORMAL HIGH (ref 26.0–34.0)
MCH: 34.7 pg — ABNORMAL HIGH (ref 26.0–34.0)
MCHC: 33.9 g/dL (ref 30.0–36.0)
MCHC: 34.2 g/dL (ref 30.0–36.0)
MCV: 101.5 fL — ABNORMAL HIGH (ref 80.0–100.0)
MCV: 101.2 fL — ABNORMAL HIGH (ref 80.0–100.0)
Platelets: 180 K/uL (ref 150–400)
Platelets: 183 K/uL (ref 150–400)
RBC: 4.01 MIL/uL (ref 3.87–5.11)
RBC: 4.04 MIL/uL (ref 3.87–5.11)
RDW: 13 % (ref 11.5–15.5)
RDW: 12.9 % (ref 11.5–15.5)
WBC: 8.4 K/uL (ref 4.0–10.5)
WBC: 7 K/uL (ref 4.0–10.5)
nRBC: 0 % (ref 0.0–0.2)
nRBC: 0 % (ref 0.0–0.2)

## 2024-03-22 LAB — PROTIME-INR
INR: 1 (ref 0.8–1.2)
Prothrombin Time: 13.9 s (ref 11.4–15.2)

## 2024-03-22 LAB — CBC WITH DIFFERENTIAL/PLATELET
Abs Immature Granulocytes: 0.02 K/uL (ref 0.00–0.07)
Basophils Absolute: 0 K/uL (ref 0.0–0.1)
Basophils Relative: 0 %
Eosinophils Absolute: 0.1 K/uL (ref 0.0–0.5)
Eosinophils Relative: 1 %
HCT: 42.4 % (ref 36.0–46.0)
Hemoglobin: 14.2 g/dL (ref 12.0–15.0)
Immature Granulocytes: 0 %
Lymphocytes Relative: 22 %
Lymphs Abs: 2.1 K/uL (ref 0.7–4.0)
MCH: 34.1 pg — ABNORMAL HIGH (ref 26.0–34.0)
MCHC: 33.5 g/dL (ref 30.0–36.0)
MCV: 101.9 fL — ABNORMAL HIGH (ref 80.0–100.0)
Monocytes Absolute: 0.5 K/uL (ref 0.1–1.0)
Monocytes Relative: 5 %
Neutro Abs: 6.8 K/uL (ref 1.7–7.7)
Neutrophils Relative %: 72 %
Platelets: 218 K/uL (ref 150–400)
RBC: 4.16 MIL/uL (ref 3.87–5.11)
RDW: 13 % (ref 11.5–15.5)
WBC: 9.5 K/uL (ref 4.0–10.5)
nRBC: 0 % (ref 0.0–0.2)

## 2024-03-22 LAB — COMPREHENSIVE METABOLIC PANEL WITH GFR
ALT: 76 U/L — ABNORMAL HIGH (ref 0–44)
ALT: 76 U/L — ABNORMAL HIGH (ref 0–44)
AST: 91 U/L — ABNORMAL HIGH (ref 15–41)
AST: 91 U/L — ABNORMAL HIGH (ref 15–41)
Albumin: 4 g/dL (ref 3.5–5.0)
Albumin: 3.8 g/dL (ref 3.5–5.0)
Alkaline Phosphatase: 89 U/L (ref 38–126)
Alkaline Phosphatase: 86 U/L (ref 38–126)
Anion gap: 12 (ref 5–15)
Anion gap: 11 (ref 5–15)
BUN: 10 mg/dL (ref 6–20)
BUN: 10 mg/dL (ref 6–20)
CO2: 22 mmol/L (ref 22–32)
CO2: 25 mmol/L (ref 22–32)
Calcium: 8.6 mg/dL — ABNORMAL LOW (ref 8.9–10.3)
Calcium: 8.1 mg/dL — ABNORMAL LOW (ref 8.9–10.3)
Chloride: 102 mmol/L (ref 98–111)
Chloride: 102 mmol/L (ref 98–111)
Creatinine, Ser: 0.61 mg/dL (ref 0.44–1.00)
Creatinine, Ser: 0.54 mg/dL (ref 0.44–1.00)
GFR, Estimated: >60 mL/min
GFR, Estimated: >60 mL/min
Glucose, Bld: 93 mg/dL (ref 70–99)
Glucose, Bld: 101 mg/dL — ABNORMAL HIGH (ref 70–99)
Potassium: 3.7 mmol/L (ref 3.5–5.1)
Potassium: 3.6 mmol/L (ref 3.5–5.1)
Sodium: 137 mmol/L (ref 135–145)
Sodium: 137 mmol/L (ref 135–145)
Total Bilirubin: 0.7 mg/dL (ref 0.0–1.2)
Total Bilirubin: 0.7 mg/dL (ref 0.0–1.2)
Total Protein: 7.4 g/dL (ref 6.5–8.1)
Total Protein: 7.1 g/dL (ref 6.5–8.1)

## 2024-03-22 LAB — LIPID PANEL
Cholesterol: 150 mg/dL (ref 0–200)
HDL: 65 mg/dL
LDL Cholesterol: 64 mg/dL (ref 0–99)
Total CHOL/HDL Ratio: 2.3 ratio
Triglycerides: 104 mg/dL
VLDL: 21 mg/dL (ref 0–40)

## 2024-03-22 LAB — TSH: TSH: 5.27 u[IU]/mL — ABNORMAL HIGH (ref 0.350–4.500)

## 2024-03-22 LAB — CREATININE, SERUM
Creatinine, Ser: 0.61 mg/dL (ref 0.44–1.00)
GFR, Estimated: >60 mL/min

## 2024-03-22 LAB — URINE DRUG SCREEN
Amphetamines: NEGATIVE
Barbiturates: NEGATIVE
Benzodiazepines: POSITIVE — AB
Cocaine: NEGATIVE
Fentanyl: NEGATIVE
Methadone Scn, Ur: NEGATIVE
Opiates: POSITIVE — AB
Tetrahydrocannabinol: NEGATIVE

## 2024-03-22 LAB — LIPASE, BLOOD: Lipase: 1393 U/L — ABNORMAL HIGH (ref 11–51)

## 2024-03-22 LAB — ETHANOL: Alcohol, Ethyl (B): <15 mg/dL

## 2024-03-22 LAB — HEMOGLOBIN A1C
Hgb A1c MFr Bld: 6 % — ABNORMAL HIGH (ref 4.8–5.6)
Mean Plasma Glucose: 125.5 mg/dL

## 2024-03-22 MED ORDER — MORPHINE SULFATE (PF) 2 MG/ML IV SOLN
1.0000 mg | INTRAVENOUS | Status: DC | PRN
  Administered 2024-03-22: 1 mg via INTRAVENOUS
  Filled 2024-03-22: qty 1

## 2024-03-22 MED ORDER — MORPHINE SULFATE (PF) 2 MG/ML IV SOLN
2.0000 mg | INTRAVENOUS | Status: DC | PRN
  Administered 2024-03-22 (×3): 2 mg via INTRAVENOUS
  Filled 2024-03-22 (×3): qty 1

## 2024-03-22 MED ORDER — NICOTINE 21 MG/24HR TD PT24
21.0000 mg | MEDICATED_PATCH | Freq: Every day | TRANSDERMAL | Status: DC
  Administered 2024-03-22 – 2024-03-24 (×3): 21 mg via TRANSDERMAL
  Filled 2024-03-22 (×3): qty 1

## 2024-03-22 MED ORDER — HEPARIN SODIUM (PORCINE) 5000 UNIT/ML IJ SOLN
5000.0000 [IU] | Freq: Three times a day (TID) | INTRAMUSCULAR | Status: DC
  Administered 2024-03-22 – 2024-03-24 (×9): 5000 [IU] via SUBCUTANEOUS
  Filled 2024-03-22 (×8): qty 1

## 2024-03-22 MED ORDER — PANTOPRAZOLE SODIUM 40 MG IV SOLR
40.0000 mg | Freq: Once | INTRAVENOUS | Status: AC
  Administered 2024-03-22: 40 mg via INTRAVENOUS
  Filled 2024-03-22: qty 10

## 2024-03-22 MED ORDER — OXYCODONE HCL 5 MG PO TABS
10.0000 mg | ORAL_TABLET | Freq: Four times a day (QID) | ORAL | Status: DC | PRN
  Administered 2024-03-22 – 2024-03-24 (×8): 10 mg via ORAL
  Filled 2024-03-22 (×8): qty 2

## 2024-03-22 MED ORDER — SIMETHICONE 80 MG PO CHEW
80.0000 mg | CHEWABLE_TABLET | Freq: Four times a day (QID) | ORAL | Status: DC
  Administered 2024-03-22 – 2024-03-24 (×8): 80 mg via ORAL
  Filled 2024-03-22 (×8): qty 1

## 2024-03-22 MED ORDER — SODIUM CHLORIDE 0.9 % IV SOLN: 1.0000 g | INTRAVENOUS | Status: DC

## 2024-03-22 MED ORDER — LACTATED RINGERS IV SOLN: INTRAVENOUS | Status: AC

## 2024-03-22 MED ORDER — ONDANSETRON HCL 4 MG/2ML IJ SOLN
4.0000 mg | Freq: Four times a day (QID) | INTRAMUSCULAR | Status: DC | PRN
  Administered 2024-03-22: 4 mg via INTRAVENOUS
  Filled 2024-03-22: qty 2

## 2024-03-22 MED ORDER — ALBUTEROL SULFATE (2.5 MG/3ML) 0.083% IN NEBU: 2.5000 mg | INHALATION_SOLUTION | RESPIRATORY_TRACT | Status: DC | PRN

## 2024-03-22 MED ORDER — ONDANSETRON HCL 4 MG PO TABS: 4.0000 mg | ORAL_TABLET | Freq: Four times a day (QID) | ORAL | Status: DC | PRN

## 2024-03-22 MED ORDER — PANTOPRAZOLE SODIUM 40 MG IV SOLR
40.0000 mg | Freq: Two times a day (BID) | INTRAVENOUS | Status: DC
  Administered 2024-03-22 – 2024-03-23 (×3): 40 mg via INTRAVENOUS
  Filled 2024-03-22 (×3): qty 10

## 2024-03-22 MED ORDER — HYDROMORPHONE HCL 1 MG/ML IJ SOLN
0.5000 mg | INTRAMUSCULAR | Status: DC | PRN
  Administered 2024-03-22 – 2024-03-24 (×7): 0.5 mg via INTRAVENOUS
  Filled 2024-03-22 (×6): qty 0.5

## 2024-03-22 MED ORDER — PANTOPRAZOLE SODIUM 40 MG PO TBEC: 40.0000 mg | DELAYED_RELEASE_TABLET | Freq: Every day | ORAL | Status: DC

## 2024-03-22 MED ORDER — PANTOPRAZOLE SODIUM 40 MG IV SOLR: 40.0000 mg | INTRAVENOUS | Status: DC

## 2024-03-22 MED ORDER — SODIUM CHLORIDE 0.9 % IV SOLN
2.0000 g | INTRAVENOUS | Status: DC
  Administered 2024-03-22 – 2024-03-23 (×2): 2 g via INTRAVENOUS
  Filled 2024-03-22 (×2): qty 20

## 2024-03-22 MED ORDER — LACTATED RINGERS IV SOLN: INTRAVENOUS | Status: DC

## 2024-03-22 MED ORDER — HYDROCODONE-ACETAMINOPHEN 5-325 MG PO TABS
1.0000 | ORAL_TABLET | ORAL | Status: DC | PRN
  Filled 2024-03-22: qty 2

## 2024-03-22 MED ORDER — METOCLOPRAMIDE HCL 5 MG/ML IJ SOLN: 5.0000 mg | Freq: Four times a day (QID) | INTRAMUSCULAR | Status: DC | PRN

## 2024-03-22 MED ORDER — ONDANSETRON HCL 4 MG/2ML IJ SOLN: 4.0000 mg | Freq: Four times a day (QID) | INTRAMUSCULAR | Status: DC | PRN

## 2024-03-22 NOTE — Plan of Care (Signed)
   Problem: Education: Goal: Knowledge of General Education information will improve Description Including pain rating scale, medication(s)/side effects and non-pharmacologic comfort measures Outcome: Progressing   Problem: Health Behavior/Discharge Planning: Goal: Ability to manage health-related needs will improve Outcome: Progressing

## 2024-03-22 NOTE — H&P (Addendum)
 " History and Physical    Tammy Decker FMW:[id number] DOB: 30-Sep-1970 DOA: 03/21/2024  PCP: Corwin Antu, FNP  Patient coming from: home  I have personally briefly reviewed patient's old medical records in [facility]  Chief Complaint: abdominal pain n/v  HPI: Tammy Decker is a 54 y.o. female with medical history significant of Depression Asthma, GERD, HTN , ETOH abuse, ETOH pancreatitis  who presents to ED  BIB EMS with complaint of abdominal pain n/v x 2 days similar to her prior episodes of pancreatitis. Of note patient does endorse daily etoh use. Patient notes no fever/chills/ but does note loose stools, but no black stools or blood in stools.    ED Course:  Vitals:afeb, bp 155/101, HR 90, rr 18, sat 98%  Wbc 10.8, hgb 15.4, plt 224,  AG 15 Na 138, K 3.8, Cl 103, bicarb 21, Glu 80, cr 0.58, AST 75, ALT 78  Lipase 1115 UA: wbc 6-10, +bacteria , +nitrate CTAB/pelvis: MPRESSION: 1. Findings consistent with mild acute pancreatitis, without fluid collection or ductal dilatation. 2. Proximal duodenitis, without perforation or abscess. 3. Hepatomegaly with diffuse hepatic steatosis. Tx morphine ,  zofran  ,ctx LR1L,dilaudid  Review of Systems: As per HPI otherwise 10 point review of systems negative.   Past Medical History:  Diagnosis Date   Asthma    Complication of anesthesia    OXYGEN SOMETIMES DROPS LOW   GERD (gastroesophageal reflux disease)    Hypertension    Pyelonephritis    Tachycardia 2020    Past Surgical History:  Procedure Laterality Date   BACK SURGERY     CHOLECYSTECTOMY     ESOPHAGOGASTRODUODENOSCOPY (EGD) WITH PROPOFOL  N/A 06/19/2021   Procedure: ESOPHAGOGASTRODUODENOSCOPY (EGD) WITH PROPOFOL ;  Surgeon: Unk Corinn Skiff, MD;  Location: [facility] ENDOSCOPY;  Service: Gastroenterology;  Laterality: N/A;   NECK SURGERY     uterine ablation     XI ROBOTIC ASSISTED VENTRAL HERNIA N/A 05/09/2020   Procedure: XI ROBOTIC ASSISTED VENTRAL HERNIA,  incisional hernia;  Surgeon: Jordis Laneta FALCON, MD;  Location: [facility] ORS;  Service: General;  Laterality: N/A;     reports that she has been smoking cigarettes. She has been exposed to tobacco smoke. She has never used smokeless tobacco. She reports current alcohol use. She reports that she does not use drugs.  Allergies[1]  Family History  Problem Relation Age of Onset   Diabetes Mother    Diabetes Father    Heart attack Father    Ovarian cancer Maternal Grandmother        older dx maybe early 39s   Colon cancer Maternal Grandfather        in his 35's   Early death Paternal Grandfather    Heart attack Paternal Grandfather     Prior to Admission medications  Medication Sig Start Date End Date Taking? Authorizing Provider  EPINEPHrine  0.3 mg/0.3 mL IJ SOAJ injection Inject 0.3 mg into the muscle as needed for anaphylaxis. 02/13/23  Yes Dugal, Antu, FNP  losartan -hydrochlorothiazide (HYZAAR) 100-25 MG tablet Take 1 tablet by mouth daily. 02/27/24  Yes Dugal, Antu, FNP  busPIRone  (BUSPAR ) 5 MG tablet Take 1 tablet (5 mg total) by mouth 2 (two) times daily. 02/27/24   Corwin Antu, FNP  clobetasol  ointment (TEMOVATE ) 0.05 % Apply 1 Application topically 2 (two) times daily. twice daily to lower legs x 2 weeks on and 2 weeks off until clear when condition is flared. Patient not taking: Reported on 03/22/2024 10/08/23   Alm Delon SAILOR, DO  cyanocobalamin  (VITAMIN B12) 1000 MCG/ML injection Inject 1 ml 1000 mcg total into muscle once a week for three weeks then once a month for three months Patient not taking: Reported on 03/22/2024 01/09/23   Corwin Antu, FNP  hydrOXYzine  (ATARAX ) 25 MG tablet Take 1-2 tablets tid prn anxiety Patient not taking: Reported on 03/22/2024 01/26/24   Corwin Antu, FNP  LUER LOCK SAFETY SYRINGES 25G X 1 3 ML MISC USE TO INJECT B12 INTO THE MUSCLE ONCE WEEKLY FOR 4 WEEK THEN USE ONCE MONTHLY FOR 3 MONTHS 03/31/23   Corwin Antu, FNP  pantoprazole   (PROTONIX ) 40 MG tablet Take 1 tablet (40 mg total) by mouth daily for 15 days. 01/21/24 02/27/24  Charmayne Holmes, DO    Physical Exam: Vitals:   03/21/24 2034 03/22/24 0000 03/22/24 0328  BP: (!) 155/101 (!) 167/100   Pulse: 90 89   Resp: 18 17   Temp: 98.5 F (36.9 C)  98.8 F (37.1 C)  TempSrc: Oral  Oral  SpO2: 98% 96%     Constitutional: NAD, calm, comfortable Vitals:   03/21/24 2034 03/22/24 0000 03/22/24 0328  BP: (!) 155/101 (!) 167/100   Pulse: 90 89   Resp: 18 17   Temp: 98.5 F (36.9 C)  98.8 F (37.1 C)  TempSrc: Oral  Oral  SpO2: 98% 96%    Eyes: PERRL, lids and conjunctivae normal ENMT: Mucous membranes are moist. Posterior pharynx clear of any exudate or lesions.Normal dentition.  Neck: normal, supple, no masses, no thyromegaly Respiratory: clear to auscultation bilaterally, no wheezing, no crackles. Normal respiratory effort. No accessory muscle use.  Cardiovascular: Regular rate and rhythm, no murmurs / rubs / gallops. No extremity edema. 2+ pedal pulses. Abdomen: + epigastric tenderness, no masses palpated. No hepatosplenomegaly. Bowel sounds positive.  Musculoskeletal: no clubbing / cyanosis. No joint deformity upper and lower extremities. Good ROM, no contractures. Normal muscle tone.  Skin: no rashes, lesions, ulcers. No induration Neurologic: CN 2-12 grossly intact. Sensation intact, Strength 5/5 in all 4.  Psychiatric: Normal judgment and insight. Alert and oriented x 3. Normal mood.    Labs on Admission: I have personally reviewed following labs and imaging studies  CBC: Recent Labs  Lab 03/21/24 2051 03/22/24 0214  WBC 10.8* 9.5  NEUTROABS 7.7 6.8  HGB 15.4* 14.2  HCT 44.4 42.4  MCV 99.8 101.9*  PLT 224 218   Basic Metabolic Panel: Recent Labs  Lab 03/21/24 2051  NA 138  K 3.8  CL 103  CO2 21*  GLUCOSE 80  BUN 11  CREATININE 0.58  CALCIUM  8.8*   GFR: CrCl cannot be calculated (Unknown ideal weight.). Liver Function  Tests: Recent Labs  Lab 03/21/24 2051  AST 75*  ALT 78*  ALKPHOS 93  BILITOT 0.5  PROT 7.9  ALBUMIN 4.2   Recent Labs  Lab 03/21/24 2051  LIPASE 1,115*   No results for input(s): AMMONIA in the last 168 hours. Coagulation Profile: No results for input(s): INR, PROTIME in the last 168 hours. Cardiac Enzymes: No results for input(s): CKTOTAL, CKMB, CKMBINDEX, TROPONINI in the last 168 hours. BNP (last 3 results) No results for input(s): PROBNP in the last 8760 hours. HbA1C: No results for input(s): HGBA1C in the last 72 hours. CBG: No results for input(s): GLUCAP in the last 168 hours. Lipid Profile: No results for input(s): CHOL, HDL, LDLCALC, TRIG, CHOLHDL, LDLDIRECT in the last 72 hours. Thyroid  Function Tests: No results for input(s): TSH, T4TOTAL, FREET4, T3FREE, THYROIDAB in the last  72 hours. Anemia Panel: No results for input(s): VITAMINB12, FOLATE, FERRITIN, TIBC, IRON, RETICCTPCT in the last 72 hours. Urine analysis:    Component Value Date/Time   COLORURINE YELLOW 03/21/2024 2235   APPEARANCEUR HAZY (A) 03/21/2024 2235   APPEARANCEUR Clear 09/24/2023 1510   LABSPEC 1.021 03/21/2024 2235   PHURINE 5.0 03/21/2024 2235   GLUCOSEU NEGATIVE 03/21/2024 2235   GLUCOSEU NEGATIVE 09/17/2023 1003   HGBUR MODERATE (A) 03/21/2024 2235   BILIRUBINUR NEGATIVE 03/21/2024 2235   BILIRUBINUR Negative 09/24/2023 1510   KETONESUR 20 (A) 03/21/2024 2235   PROTEINUR 30 (A) 03/21/2024 2235   UROBILINOGEN 0.2 09/17/2023 1003   UROBILINOGEN 0.2 09/17/2023 0952   NITRITE POSITIVE (A) 03/21/2024 2235   LEUKOCYTESUR TRACE (A) 03/21/2024 2235    Radiological Exams on Admission: CT ABDOMEN PELVIS W CONTRAST Result Date: 03/21/2024 EXAM: CT ABDOMEN AND PELVIS WITH CONTRAST 03/21/2024 11:26:50 PM TECHNIQUE: CT of the abdomen and pelvis was performed with the administration of 100 mL of iohexol  (OMNIPAQUE ) 300 MG/ML solution.  Multiplanar reformatted images are provided for review. Automated exposure control, iterative reconstruction, and/or weight-based adjustment of the mA/kV was utilized to reduce the radiation dose to as low as reasonably achievable. COMPARISON: Abdominal ultrasound 01/21/2024 and CT abdomen and pelvis 01/21/2024. CLINICAL HISTORY: Pancreatitis suspected. FINDINGS: LOWER CHEST: No acute abnormality. LIVER: Diffuse fatty infiltration of the liver and enlargement of the liver. GALLBLADDER AND BILE DUCTS: The gallbladder is surgically absent. No biliary ductal dilatation. SPLEEN: Calcified granulomas in the spleen. PANCREAS: Mild inflammatory stranding surrounding the pancreatic head. No evidence for fluid collection or ductal dilatation. ADRENAL GLANDS: No acute abnormality. KIDNEYS, URETERS AND BLADDER: No stones in the kidneys or ureters. No hydronephrosis. No perinephric or periureteral stranding. Urinary bladder is unremarkable. GI AND BOWEL: Stomach demonstrates no acute abnormality. There is wall thickening and inflammation of the proximal duodenum. There is no evidence for perforation or abscess. The appendix is within normal limits. There is no bowel obstruction. PERITONEUM AND RETROPERITONEUM: No ascites. No free air. VASCULATURE: Aorta is normal in caliber. There are atherosclerotic calcifications of the aorta. LYMPH NODES: No lymphadenopathy. REPRODUCTIVE ORGANS: No acute abnormality. BONES AND SOFT TISSUES: No acute osseous abnormality. Thoracic spinal cord stimulator device present. No focal soft tissue abnormality. IMPRESSION: 1. Findings consistent with mild acute pancreatitis, without fluid collection or ductal dilatation. 2. Proximal duodenitis, without perforation or abscess. 3. Hepatomegaly with diffuse hepatic steatosis. Electronically signed by: Greig Pique MD 03/21/2024 11:34 PM EST RP Workstation: [id number]    EKG: n/a  Assessment/Plan Acute ETOH pancreatitis  Proximal duodenitis -admit  to med tele  -continue with ivfs -supportive pain medications , and antiemetics  - iv ppi  Abnormal lfts -in setting of ETOH/fatty liver -due to mild eoth transaminitis  -monitor labs    ETOH abuse -placed on ciwa  -encourage cessation   UTI -ctx  -f/u culture data   Asthma  -no current ex  HTN -resume home regimen  -prn hydralazine    Anxiety  Depression  -no active issues   Tobacco abuse  -encourage cessation  -nicotine  patch   GERD -ppi  DVT prophylaxis:  heparin  Code Status: full/ as discussed per patient wishes in event of cardiac arrest  Family Communication: none at bedside Disposition Plan: patient  expected to be admitted greater than 2 midnights  Consults called: n/a Admission status: med tele   Camila DELENA Ned MD [facility]   If 7PM-7AM, please contact night-coverage [email or web address] Password [email or web address]  03/22/2024, 3:29 AM        [  1]  Allergies Allergen Reactions   Ciprofloxacin Anaphylaxis and Hives   Kiwi Extract Anaphylaxis   Metoprolol     Other     Ticks, beef, lamb, pork   Propranolol  Other (See Comments)    fatigue   Sulfa Antibiotics Hives   Valsartan  Other (See Comments)    Facial flushing   "

## 2024-03-22 NOTE — Progress Notes (Signed)
 " PROGRESS NOTE  Tammy Decker FMW:[id number] DOB: Dec 14, 1970   PCP: Corwin Antu, FNP  Patient is from: Home.  Independently ambulates at baseline.  DOA: 03/21/2024 LOS: 0  Chief complaints Chief Complaint  Patient presents with   Abdominal Pain     Brief Narrative / Interim history: 54 year old F with PMH of alcohol abuse, prior EtOH pancreatitis, asthma, HTN, GERD and depression presented to ED with nausea, vomiting and abdominal pain for 2 days, and admitted for acute alcoholic pancreatitis, elevated liver enzymes and possible UTI.  Reportedly drinks about 4 cans of 12 ounce beers a day and sometimes with vodka.  She lost her son in 01/2022 and has been using alcohol for self-destruction.  Last drink was about 2 days prior to coming to the hospital.  Denies major withdrawal symptoms other than some anxiety and shakiness.  In ED, slightly elevated BP.  Bicarb 21.  AG 15.  AST 75.  ALT 78.  Lipase 1115.  UA concerning for UTI.  CT abdomen and pelvis concerning for mild acute pancreatitis without fluid collection or ductal dilation, proximal duodenitis without perforation or abscess, hepatomegaly with diffuse hepatic steatosis.  Urine culture collected.  Patient was started on analgesics, antiemetics, IV fluid and ceftriaxone  and admitted for further care.  Subjective: Seen and examined earlier this morning.  No major events overnight or this morning.  Continues to endorse pain across upper abdomen radiating into her back bilaterally.  She rates her pain 8/10.  Reports nausea but no emesis.  Denies UTI symptoms other than frequent urination from IV fluid.  Reports depressed mood but denies suicidal or homicidal ideation.  Reports using alcohol for self-destruction since her son was shot and killed in 01/2024.   Assessment and plan: Acute alcoholic pancreatitis-prior history of alcoholic pancreatitis.  CT with mild acute pancreatitis without complication.  Lipase elevated to 1150 and  trended up to 1400.  Still with significant pain.  Rates her pain 8/10. -Continue IV fluid and clear liquid diet. -Adjusted pain medication and counseled on the importance of taking them cautiously  -Continue antiemetics as needed -Continue PPI -Encouraged alcohol cessation -TOC consulted for resources for EtOH, grief and depressed mood.  EtOH abuse: Reports drinking 4 self-destruction after she lost his son in November 2025.  Reports drinking 4 of 12 ounce cans beer and sometimes with vodka.  Last drink about 3 days prior.  Denies major withdrawal such as seizure or delirium. -Continue CIWA with as needed Ativan  -Continue multivitamin, thiamine  and folic acid  -Encouraged alcohol cessation. -TOC consulted for resources  Grief/history of depression: She says her son killed in 01/2024.  Also has a history of depression.  She reports depressed mood but denies SI or HI.  Discussed with psychiatry nurse practitioner who recommended supportive care and outpatient resources. - TOC consulted.  Proximal duodenitis: Likely in the setting of acute pancreatitis. -Management as above.  Abnormal lfts: Likely due to alcohol.  Stable - Continue monitoring  Possible UTI: UA with trace pyuria, positive nitrite and many bacteria.  -Continue IV ceftriaxone  pending urine culture  Essential hypertension: BP slightly elevated partly due to pain. -Hold home losartan /HCTZ.  - Pain control   Mild intermittent asthma: Stable  Tobacco abuse  -encourage cessation  -nicotine  patch    GERD -ppi  Body mass index is 28.98 kg/m.           DVT prophylaxis:  heparin  injection 5,000 Units Start: 03/22/24 0600  Code Status: Full code Family Communication: None at  bedside Level of care: Telemetry Status is: Inpatient Remains inpatient appropriate because: Acute alcoholic pancreatitis and UTI   Final disposition: Home   55 minutes with more than 50% spent in reviewing records, counseling  patient/family and coordinating care.  Consultants:  None  Procedures: None  Microbiology summarized: Urine culture pending  Objective: Vitals:   03/22/24 0406 03/22/24 0434 03/22/24 0609 03/22/24 0717  BP: (!) 176/108  (!) 145/88 (!) 145/90  Pulse: 85  79 73  Resp: 18     Temp: 98.8 F (37.1 C)     TempSrc: Oral     SpO2: 98%     Weight:  83.9 kg    Height:  5' 7 (1.702 m)      Examination:  GENERAL: No apparent distress.  Nontoxic. HEENT: MMM.  Vision and hearing grossly intact.  NECK: Supple.  No apparent JVD.  RESP:  No IWOB.  Fair aeration bilaterally. CVS:  RRR. Heart sounds normal.  ABD/GI/GU: BS+. Abd soft.  Tenderness across upper abdomen. MSK/EXT:  Moves extremities. No apparent deformity. No edema.  SKIN: no apparent skin lesion or wound NEURO: AA.  Oriented appropriately.  No apparent focal neuro deficit. PSYCH: Calm. Normal affect.   Sch Meds:  Scheduled Meds:  folic acid   1 mg Oral Daily   heparin   5,000 Units Subcutaneous Q8H   multivitamin with minerals  1 tablet Oral Daily   nicotine   21 mg Transdermal Daily   pantoprazole  (PROTONIX ) IV  40 mg Intravenous Q24H   thiamine   100 mg Oral Daily   Or   thiamine   100 mg Intravenous Daily   Continuous Infusions:  cefTRIAXone  (ROCEPHIN )  IV     lactated ringers  150 mL/hr at 03/22/24 1107   PRN Meds:.albuterol , HYDROmorphone  (DILAUDID ) injection, LORazepam  **OR** LORazepam , ondansetron  **OR** ondansetron  (ZOFRAN ) IV, oxyCODONE   Antimicrobials: Anti-infectives (From admission, onward)    Start     Dose/Rate Route Frequency Ordered Stop   03/22/24 2359  cefTRIAXone  (ROCEPHIN ) 1 g in sodium chloride  0.9 % 100 mL IVPB  Status:  Discontinued        1 g 200 mL/hr over 30 Minutes Intravenous Every 24 hours 03/22/24 0344 03/22/24 1021   03/22/24 2200  cefTRIAXone  (ROCEPHIN ) 2 g in sodium chloride  0.9 % 100 mL IVPB        2 g 200 mL/hr over 30 Minutes Intravenous Every 24 hours 03/22/24 1021      03/21/24 2330  cefTRIAXone  (ROCEPHIN ) 1 g in sodium chloride  0.9 % 100 mL IVPB        1 g 200 mL/hr over 30 Minutes Intravenous  Once 03/21/24 2318 03/22/24 0013        I have personally reviewed the following labs and images: CBC: Recent Labs  Lab 03/21/24 2051 03/22/24 0214 03/22/24 0422 03/22/24 1010  WBC 10.8* 9.5 8.4 7.0  NEUTROABS 7.7 6.8  --   --   HGB 15.4* 14.2 13.8 14.0  HCT 44.4 42.4 40.7 40.9  MCV 99.8 101.9* 101.5* 101.2*  PLT 224 218 180 183   BMP &GFR Recent Labs  Lab 03/21/24 2051 03/22/24 0214 03/22/24 0422  NA 138 137 137  K 3.8 3.7 3.6  CL 103 102 102  CO2 21* 22 25  GLUCOSE 80 93 101*  BUN 11 10 10   CREATININE 0.58 0.61 0.54  CALCIUM  8.8* 8.6* 8.1*   Estimated Creatinine Clearance: 92.6 mL/min (by C-G formula based on SCr of 0.54 mg/dL). Liver & Pancreas: Recent Labs  Lab 03/21/24  2051 03/22/24 0214 03/22/24 0422  AST 75* 91* 91*  ALT 78* 76* 76*  ALKPHOS 93 89 86  BILITOT 0.5 0.7 0.7  PROT 7.9 7.4 7.1  ALBUMIN 4.2 4.0 3.8   Recent Labs  Lab 03/21/24 2051 03/22/24 0214  LIPASE 1,115* 1,393*   No results for input(s): AMMONIA in the last 168 hours. Diabetic: No results for input(s): HGBA1C in the last 72 hours. No results for input(s): GLUCAP in the last 168 hours. Cardiac Enzymes: No results for input(s): CKTOTAL, CKMB, CKMBINDEX, TROPONINI in the last 168 hours. No results for input(s): PROBNP in the last 8760 hours. Coagulation Profile: Recent Labs  Lab 03/22/24 0422  INR 1.0   Thyroid  Function Tests: No results for input(s): TSH, T4TOTAL, FREET4, T3FREE, THYROIDAB in the last 72 hours. Lipid Profile: No results for input(s): CHOL, HDL, LDLCALC, TRIG, CHOLHDL, LDLDIRECT in the last 72 hours. Anemia Panel: No results for input(s): VITAMINB12, FOLATE, FERRITIN, TIBC, IRON, RETICCTPCT in the last 72 hours. Urine analysis:    Component Value Date/Time   COLORURINE YELLOW  03/21/2024 2235   APPEARANCEUR HAZY (A) 03/21/2024 2235   APPEARANCEUR Clear 09/24/2023 1510   LABSPEC 1.021 03/21/2024 2235   PHURINE 5.0 03/21/2024 2235   GLUCOSEU NEGATIVE 03/21/2024 2235   GLUCOSEU NEGATIVE 09/17/2023 1003   HGBUR MODERATE (A) 03/21/2024 2235   BILIRUBINUR NEGATIVE 03/21/2024 2235   BILIRUBINUR Negative 09/24/2023 1510   KETONESUR 20 (A) 03/21/2024 2235   PROTEINUR 30 (A) 03/21/2024 2235   UROBILINOGEN 0.2 09/17/2023 1003   UROBILINOGEN 0.2 09/17/2023 0952   NITRITE POSITIVE (A) 03/21/2024 2235   LEUKOCYTESUR TRACE (A) 03/21/2024 2235   Sepsis Labs: Invalid input(s): PROCALCITONIN, LACTICIDVEN  Microbiology: No results found for this or any previous visit (from the past 240 hours).  Radiology Studies: CT ABDOMEN PELVIS W CONTRAST Result Date: 03/21/2024 EXAM: CT ABDOMEN AND PELVIS WITH CONTRAST 03/21/2024 11:26:50 PM TECHNIQUE: CT of the abdomen and pelvis was performed with the administration of 100 mL of iohexol  (OMNIPAQUE ) 300 MG/ML solution. Multiplanar reformatted images are provided for review. Automated exposure control, iterative reconstruction, and/or weight-based adjustment of the mA/kV was utilized to reduce the radiation dose to as low as reasonably achievable. COMPARISON: Abdominal ultrasound 01/21/2024 and CT abdomen and pelvis 01/21/2024. CLINICAL HISTORY: Pancreatitis suspected. FINDINGS: LOWER CHEST: No acute abnormality. LIVER: Diffuse fatty infiltration of the liver and enlargement of the liver. GALLBLADDER AND BILE DUCTS: The gallbladder is surgically absent. No biliary ductal dilatation. SPLEEN: Calcified granulomas in the spleen. PANCREAS: Mild inflammatory stranding surrounding the pancreatic head. No evidence for fluid collection or ductal dilatation. ADRENAL GLANDS: No acute abnormality. KIDNEYS, URETERS AND BLADDER: No stones in the kidneys or ureters. No hydronephrosis. No perinephric or periureteral stranding. Urinary bladder is  unremarkable. GI AND BOWEL: Stomach demonstrates no acute abnormality. There is wall thickening and inflammation of the proximal duodenum. There is no evidence for perforation or abscess. The appendix is within normal limits. There is no bowel obstruction. PERITONEUM AND RETROPERITONEUM: No ascites. No free air. VASCULATURE: Aorta is normal in caliber. There are atherosclerotic calcifications of the aorta. LYMPH NODES: No lymphadenopathy. REPRODUCTIVE ORGANS: No acute abnormality. BONES AND SOFT TISSUES: No acute osseous abnormality. Thoracic spinal cord stimulator device present. No focal soft tissue abnormality. IMPRESSION: 1. Findings consistent with mild acute pancreatitis, without fluid collection or ductal dilatation. 2. Proximal duodenitis, without perforation or abscess. 3. Hepatomegaly with diffuse hepatic steatosis. Electronically signed by: Greig Pique MD 03/21/2024 11:34 PM EST RP Workstation: [id number]  Gabino Hagin T. Aahana Elza [facility]  If 7PM-7AM, please contact night-coverage [email or web address] 03/22/2024, 11:29 AM   "

## 2024-03-22 NOTE — Plan of Care (Signed)
  Problem: Activity: Goal: Risk for activity intolerance will decrease Outcome: Progressing   Problem: Coping: Goal: Level of anxiety will decrease Outcome: Progressing   Problem: Elimination: Goal: Will not experience complications related to bowel motility Outcome: Progressing   Problem: Pain Managment: Goal: General experience of comfort will improve and/or be controlled Outcome: Progressing

## 2024-03-23 DIAGNOSIS — N3000 Acute cystitis without hematuria: Secondary | ICD-10-CM

## 2024-03-23 DIAGNOSIS — K852 Alcohol induced acute pancreatitis without necrosis or infection: Secondary | ICD-10-CM

## 2024-03-23 LAB — CBC
HCT: 39.1 % (ref 36.0–46.0)
Hemoglobin: 13.4 g/dL (ref 12.0–15.0)
MCH: 34.5 pg — ABNORMAL HIGH (ref 26.0–34.0)
MCHC: 34.3 g/dL (ref 30.0–36.0)
MCV: 100.8 fL — ABNORMAL HIGH (ref 80.0–100.0)
Platelets: 174 K/uL (ref 150–400)
RBC: 3.88 MIL/uL (ref 3.87–5.11)
RDW: 12.7 % (ref 11.5–15.5)
WBC: 7.3 K/uL (ref 4.0–10.5)
nRBC: 0 % (ref 0.0–0.2)

## 2024-03-23 LAB — COMPREHENSIVE METABOLIC PANEL WITH GFR
ALT: 97 U/L — ABNORMAL HIGH (ref 0–44)
AST: 112 U/L — ABNORMAL HIGH (ref 15–41)
Albumin: 3.7 g/dL (ref 3.5–5.0)
Alkaline Phosphatase: 85 U/L (ref 38–126)
Anion gap: 10 (ref 5–15)
BUN: 6 mg/dL (ref 6–20)
CO2: 24 mmol/L (ref 22–32)
Calcium: 8.7 mg/dL — ABNORMAL LOW (ref 8.9–10.3)
Chloride: 102 mmol/L (ref 98–111)
Creatinine, Ser: 0.56 mg/dL (ref 0.44–1.00)
GFR, Estimated: >60 mL/min
Glucose, Bld: 83 mg/dL (ref 70–99)
Potassium: 4 mmol/L (ref 3.5–5.1)
Sodium: 136 mmol/L (ref 135–145)
Total Bilirubin: 0.6 mg/dL (ref 0.0–1.2)
Total Protein: 6.7 g/dL (ref 6.5–8.1)

## 2024-03-23 LAB — LIPASE, BLOOD: Lipase: 163 U/L — ABNORMAL HIGH (ref 11–51)

## 2024-03-23 LAB — T4, FREE: Free T4: 1.11 ng/dL (ref 0.80–2.00)

## 2024-03-23 LAB — PHOSPHORUS: Phosphorus: 2.1 mg/dL — ABNORMAL LOW (ref 2.5–4.6)

## 2024-03-23 LAB — MAGNESIUM: Magnesium: 1.5 mg/dL — ABNORMAL LOW (ref 1.7–2.4)

## 2024-03-23 MED ORDER — MAGNESIUM SULFATE 2 GM/50ML IV SOLN
2.0000 g | Freq: Once | INTRAVENOUS | Status: AC
  Administered 2024-03-23: 2 g via INTRAVENOUS
  Filled 2024-03-23: qty 50

## 2024-03-23 MED ORDER — SODIUM PHOSPHATES 45 MMOLE/15ML IV SOLN
15.0000 mmol | Freq: Once | INTRAVENOUS | Status: AC
  Administered 2024-03-23: 15 mmol via INTRAVENOUS
  Filled 2024-03-23 (×2): qty 5

## 2024-03-23 MED ORDER — HYDRALAZINE HCL 25 MG PO TABS: 25.0000 mg | ORAL_TABLET | Freq: Four times a day (QID) | ORAL | Status: DC | PRN

## 2024-03-23 MED ORDER — LOSARTAN POTASSIUM 50 MG PO TABS
50.0000 mg | ORAL_TABLET | Freq: Every day | ORAL | Status: DC
  Administered 2024-03-23 – 2024-03-24 (×2): 50 mg via ORAL
  Filled 2024-03-23 (×2): qty 1

## 2024-03-23 MED ORDER — LACTATED RINGERS IV SOLN: INTRAVENOUS | Status: DC

## 2024-03-23 NOTE — Progress Notes (Signed)
 " PROGRESS NOTE  Tammy Decker FMW:[id number] DOB: 10-07-1970   PCP: Corwin Antu, FNP  Patient is from: Home.  Independently ambulates at baseline.  DOA: 03/21/2024 LOS: 1  Chief complaints Chief Complaint  Patient presents with   Abdominal Pain     Brief Narrative / Interim history: 54 year old F with PMH of alcohol abuse, prior EtOH pancreatitis, asthma, HTN, GERD and depression presented to ED with nausea, vomiting and abdominal pain for 2 days, and admitted for acute alcoholic pancreatitis, elevated liver enzymes and possible UTI.  Reportedly drinks about 4 cans of 12 ounce beers a day and sometimes with vodka.  She lost her son in 01/2022 and has been using alcohol for self-destruction.  Last drink was about 2 days prior to coming to the hospital.  Denies major withdrawal symptoms other than some anxiety and shakiness.  In ED, slightly elevated BP.  Bicarb 21.  AG 15.  AST 75.  ALT 78.  Lipase 1115.  UA concerning for UTI.  CT abdomen and pelvis concerning for mild acute pancreatitis without fluid collection or ductal dilation, proximal duodenitis without perforation or abscess, hepatomegaly with diffuse hepatic steatosis.  Urine culture collected.  Patient was started on analgesics, antiemetics, IV fluid and ceftriaxone  and admitted for further care.  Urine culture with E. coli.  Pancreatitis improved    Subjective: Seen and examined earlier this morning.  No major events overnight or this morning.  Reports feeling better.  Currently no abdominal pain after she received pain medication.  Denies nausea or vomiting.  Had normal bowel movements.  Assessment and plan: Acute alcoholic pancreatitis-prior history of alcoholic pancreatitis.  CT with mild acute pancreatitis without complication.  Lipase 1150>> 1400>> 163.  Pain and lipase improved. - Advance to full liquid diet -Adjusted pain medication and counseled on the importance of taking them cautiously  -Continue  antiemetics as needed -Continue PPI -Encouraged alcohol cessation -TOC consulted for resources for EtOH, grief and depressed mood.  EtOH abuse: Reports drinking 4 self-destruction after she lost his son in November 2025.  Reports drinking 4 of 12 ounce cans beer and sometimes with vodka.  Last drink about 3 days prior.  Denies major withdrawal such as seizure or delirium. -Continue CIWA with as needed Ativan  -Continue multivitamin, thiamine  and folic acid  -Encouraged alcohol cessation. -TOC consulted for resources  Grief/history of depression: She says her son killed in 01/2024.  Also has a history of depression.  She reports depressed mood but denies SI or HI.  Discussed with psychiatry nurse practitioner who recommended supportive care and outpatient resources. - TOC consulted.  Proximal duodenitis: Likely in the setting of acute pancreatitis. -Management as above.  Abnormal lfts: Pattern consistent with alcohol. -Check CK -Continue monitoring  Possible UTI: UA with trace pyuria, positive nitrite and many bacteria.  Urine culture was E. coli. -Continue IV ceftriaxone  pending urine culture sensitivity  Essential hypertension: BP slightly elevated partly due to pain. -Resume home losartan  at 50 mg daily.  Takes 100 mg daily at home. -P.o. hydralazine  as needed -Pain control  Hypomagnesemia/hypophosphatemia -Replenish as appropriate.  Elevated TSH: Mild - Check free T4   Mild intermittent asthma: Stable  Tobacco abuse  -encourage cessation  -nicotine  patch    GERD -ppi  Body mass index is 28.98 kg/m.           DVT prophylaxis:  heparin  injection 5,000 Units Start: 03/22/24 0600  Code Status: Full code Family Communication: None at bedside Level of care: Telemetry Status is:  Inpatient Remains inpatient appropriate because: Acute alcoholic pancreatitis and UTI   Final disposition: Home   55 minutes with more than 50% spent in reviewing records, counseling  patient/family and coordinating care.  Consultants:  None  Procedures: None  Microbiology summarized: Urine culture with E. coli.  Objective: Vitals:   03/22/24 1354 03/22/24 1433 03/22/24 1948 03/23/24 0521  BP: (!) 142/93 (!) 142/93 (!) 155/95 (!) 147/89  Pulse: 84 84 94 76  Resp:   16 18  Temp:   98.5 F (36.9 C) 98.6 F (37 C)  TempSrc:   Oral Oral  SpO2:  97% 99% 98%  Weight:      Height:        Examination:  GENERAL: No apparent distress.  Nontoxic. HEENT: MMM.  Vision and hearing grossly intact.  NECK: Supple.  No apparent JVD.  RESP:  No IWOB.  Fair aeration bilaterally. CVS:  RRR. Heart sounds normal.  ABD/GI/GU: BS+. Abd soft.  Mild tenderness across upper abdomen. MSK/EXT:  Moves extremities. No apparent deformity. No edema.  SKIN: no apparent skin lesion or wound NEURO: AA.  Oriented appropriately.  No apparent focal neuro deficit. PSYCH: Calm. Normal affect.   Sch Meds:  Scheduled Meds:  folic acid   1 mg Oral Daily   heparin   5,000 Units Subcutaneous Q8H   multivitamin with minerals  1 tablet Oral Daily   nicotine   21 mg Transdermal Daily   pantoprazole  (PROTONIX ) IV  40 mg Intravenous Q12H   simethicone   80 mg Oral QID   thiamine   100 mg Oral Daily   Or   thiamine   100 mg Intravenous Daily   Continuous Infusions:  cefTRIAXone  (ROCEPHIN )  IV Stopped (03/22/24 2035)   lactated ringers  125 mL/hr at 03/23/24 0930   sodium PHOSPHATE  IVPB (in mmol) 15 mmol (03/23/24 1110)   PRN Meds:.albuterol , HYDROmorphone  (DILAUDID ) injection, LORazepam  **OR** LORazepam , metoCLOPramide  (REGLAN ) injection, ondansetron  (ZOFRAN ) IV, oxyCODONE   Antimicrobials: Anti-infectives (From admission, onward)    Start     Dose/Rate Route Frequency Ordered Stop   03/22/24 2359  cefTRIAXone  (ROCEPHIN ) 1 g in sodium chloride  0.9 % 100 mL IVPB  Status:  Discontinued        1 g 200 mL/hr over 30 Minutes Intravenous Every 24 hours 03/22/24 0344 03/22/24 1021   03/22/24 2200   cefTRIAXone  (ROCEPHIN ) 2 g in sodium chloride  0.9 % 100 mL IVPB        2 g 200 mL/hr over 30 Minutes Intravenous Every 24 hours 03/22/24 1021     03/21/24 2330  cefTRIAXone  (ROCEPHIN ) 1 g in sodium chloride  0.9 % 100 mL IVPB        1 g 200 mL/hr over 30 Minutes Intravenous  Once 03/21/24 2318 03/22/24 0013        I have personally reviewed the following labs and images: CBC: Recent Labs  Lab 03/21/24 2051 03/22/24 0214 03/22/24 0422 03/22/24 1010 03/23/24 0448  WBC 10.8* 9.5 8.4 7.0 7.3  NEUTROABS 7.7 6.8  --   --   --   HGB 15.4* 14.2 13.8 14.0 13.4  HCT 44.4 42.4 40.7 40.9 39.1  MCV 99.8 101.9* 101.5* 101.2* 100.8*  PLT 224 218 180 183 174   BMP &GFR Recent Labs  Lab 03/21/24 2051 03/22/24 0214 03/22/24 0422 03/22/24 1010 03/23/24 0448  NA 138 137 137  --  136  K 3.8 3.7 3.6  --  4.0  CL 103 102 102  --  102  CO2 21* 22 25  --  24  GLUCOSE 80 93 101*  --  83  BUN 11 10 10   --  6  CREATININE 0.58 0.61 0.54 0.61 0.56  CALCIUM  8.8* 8.6* 8.1*  --  8.7*  MG  --   --   --   --  1.5*  PHOS  --   --   --   --  2.1*   Estimated Creatinine Clearance: 92.6 mL/min (by C-G formula based on SCr of 0.56 mg/dL). Liver & Pancreas: Recent Labs  Lab 03/21/24 2051 03/22/24 0214 03/22/24 0422 03/23/24 0448  AST 75* 91* 91* 112*  ALT 78* 76* 76* 97*  ALKPHOS 93 89 86 85  BILITOT 0.5 0.7 0.7 0.6  PROT 7.9 7.4 7.1 6.7  ALBUMIN 4.2 4.0 3.8 3.7   Recent Labs  Lab 03/21/24 2051 03/22/24 0214 03/23/24 0448  LIPASE 1,115* 1,393* 163*   No results for input(s): AMMONIA in the last 168 hours. Diabetic: Recent Labs    03/22/24 1010  HGBA1C 6.0*   No results for input(s): GLUCAP in the last 168 hours. Cardiac Enzymes: No results for input(s): CKTOTAL, CKMB, CKMBINDEX, TROPONINI in the last 168 hours. No results for input(s): PROBNP in the last 8760 hours. Coagulation Profile: Recent Labs  Lab 03/22/24 0422  INR 1.0   Thyroid  Function Tests: Recent  Labs    03/22/24 1010  TSH 5.270*   Lipid Profile: Recent Labs    03/22/24 1010  CHOL 150  HDL 65  LDLCALC 64  TRIG 104  CHOLHDL 2.3   Anemia Panel: No results for input(s): VITAMINB12, FOLATE, FERRITIN, TIBC, IRON, RETICCTPCT in the last 72 hours. Urine analysis:    Component Value Date/Time   COLORURINE YELLOW 03/21/2024 2235   APPEARANCEUR HAZY (A) 03/21/2024 2235   APPEARANCEUR Clear 09/24/2023 1510   LABSPEC 1.021 03/21/2024 2235   PHURINE 5.0 03/21/2024 2235   GLUCOSEU NEGATIVE 03/21/2024 2235   GLUCOSEU NEGATIVE 09/17/2023 1003   HGBUR MODERATE (A) 03/21/2024 2235   BILIRUBINUR NEGATIVE 03/21/2024 2235   BILIRUBINUR Negative 09/24/2023 1510   KETONESUR 20 (A) 03/21/2024 2235   PROTEINUR 30 (A) 03/21/2024 2235   UROBILINOGEN 0.2 09/17/2023 1003   UROBILINOGEN 0.2 09/17/2023 0952   NITRITE POSITIVE (A) 03/21/2024 2235   LEUKOCYTESUR TRACE (A) 03/21/2024 2235   Sepsis Labs: Invalid input(s): PROCALCITONIN, LACTICIDVEN  Microbiology: Recent Results (from the past 240 hours)  Urine Culture     Status: Abnormal (Preliminary result)   Collection Time: 03/21/24 12:22 AM   Specimen: Urine, Clean Catch  Result Value Ref Range Status   Specimen Description   Final    URINE, CLEAN CATCH Performed at [facility], [address], [place], [place] [address]    Special Requests   Final    NONE Performed at [facility], [address] [place], [place] [address]    Culture >=100,000 COLONIES/mL ESCHERICHIA COLI (A)  Final   Report Status PENDING  Incomplete    Radiology Studies: DG Abd Portable 1V Result Date: 03/22/2024 EXAM: 1 VIEW XRAY OF THE ABDOMEN 03/22/2024 05:37:00 PM COMPARISON: None available. CLINICAL HISTORY: Abdominal pain FINDINGS: BOWEL: Nonobstructive bowel gas pattern. SOFT TISSUES: Partially imaged right lower quadrant generator with spinal cord stimulator leads projecting over the lower  thoracic spine. Right upper quadrant surgical clips noted. BONES: No acute fracture. IMPRESSION: 1. No acute abdominal radiographic abnormality. 2. Partially imaged spinal cord stimulator and right upper quadrant surgical clips. Electronically signed by: Greig Pique MD 03/22/2024 05:48 PM  EST RP Workstation: [id number]      Neila Teem T. Stanisha Lorenz [facility]  If 7PM-7AM, please contact night-coverage [email or web address] 03/23/2024, 1:38 PM   "

## 2024-03-23 NOTE — Progress Notes (Addendum)
" °   03/23/24 1402  TOC Brief Assessment  Insurance and Status Reviewed  Patient has primary care physician Yes  Home environment has been reviewed home with spouse  Prior level of function: independent  Prior/Current Home Services No current home services  Social Drivers of Health Review SDOH reviewed no interventions necessary  Readmission risk has been reviewed Yes  Transition of care needs transition of care needs identified, TOC will continue to follow (needs addressed with patient)   Met with pt to address concerns with ETOH usage and potential depression/ bereavement support needs.  Pt very pleasant and talking openly about both issues.  Admits that she has had increase in drinking since the tragic death of her son in 01-13-2025.  Notes she has struggled with ETOH for several years but it became her primary coping mechanism when son died.  Of note, she also lost her brother earlier in 2025.  She was connected to bereavement services after brother's death as well as online support groups.  We discussed local bereavement programs as well as ones specifically for families who lost loved ones to violence.  Have placed information on AVS.  Pt very committed to addressing her drinking/ focus on abstinence and receiving support for her grief.  She is horticulturist, commercial.  No further IP CM needs. "

## 2024-03-23 NOTE — Plan of Care (Signed)

## 2024-03-23 NOTE — Plan of Care (Signed)
   Problem: Health Behavior/Discharge Planning: Goal: Ability to manage health-related needs will improve Outcome: Progressing   Problem: Activity: Goal: Risk for activity intolerance will decrease Outcome: Progressing   Problem: Coping: Goal: Level of anxiety will decrease Outcome: Progressing

## 2024-03-24 ENCOUNTER — Other Ambulatory Visit (HOSPITAL_COMMUNITY): Payer: Self-pay

## 2024-03-24 DIAGNOSIS — F10930 Alcohol use, unspecified with withdrawal, uncomplicated: Secondary | ICD-10-CM

## 2024-03-24 LAB — COMPREHENSIVE METABOLIC PANEL WITH GFR
ALT: 77 U/L — ABNORMAL HIGH (ref 0–44)
AST: 66 U/L — ABNORMAL HIGH (ref 15–41)
Albumin: 3.5 g/dL (ref 3.5–5.0)
Alkaline Phosphatase: 80 U/L (ref 38–126)
Anion gap: 10 (ref 5–15)
BUN: 7 mg/dL (ref 6–20)
CO2: 23 mmol/L (ref 22–32)
Calcium: 8.8 mg/dL — ABNORMAL LOW (ref 8.9–10.3)
Chloride: 104 mmol/L (ref 98–111)
Creatinine, Ser: 0.57 mg/dL (ref 0.44–1.00)
GFR, Estimated: >60 mL/min
Glucose, Bld: 92 mg/dL (ref 70–99)
Potassium: 4.4 mmol/L (ref 3.5–5.1)
Sodium: 137 mmol/L (ref 135–145)
Total Bilirubin: 0.5 mg/dL (ref 0.0–1.2)
Total Protein: 6.4 g/dL — ABNORMAL LOW (ref 6.5–8.1)

## 2024-03-24 LAB — CBC
HCT: 37.7 % (ref 36.0–46.0)
Hemoglobin: 12.8 g/dL (ref 12.0–15.0)
MCH: 34.4 pg — ABNORMAL HIGH (ref 26.0–34.0)
MCHC: 34 g/dL (ref 30.0–36.0)
MCV: 101.3 fL — ABNORMAL HIGH (ref 80.0–100.0)
Platelets: 165 K/uL (ref 150–400)
RBC: 3.72 MIL/uL — ABNORMAL LOW (ref 3.87–5.11)
RDW: 12.5 % (ref 11.5–15.5)
WBC: 6.6 K/uL (ref 4.0–10.5)
nRBC: 0 % (ref 0.0–0.2)

## 2024-03-24 LAB — CK
Total CK: 71 U/L (ref 38–234)
Total CK: 67 U/L (ref 38–234)

## 2024-03-24 LAB — URINE CULTURE: Culture: >=100000 — AB

## 2024-03-24 LAB — C-REACTIVE PROTEIN: CRP: 3.6 mg/dL — ABNORMAL HIGH

## 2024-03-24 LAB — LIPASE, BLOOD: Lipase: 122 U/L — ABNORMAL HIGH (ref 11–51)

## 2024-03-24 LAB — MAGNESIUM: Magnesium: 1.9 mg/dL (ref 1.7–2.4)

## 2024-03-24 LAB — PHOSPHORUS: Phosphorus: 2.2 mg/dL — ABNORMAL LOW (ref 2.5–4.6)

## 2024-03-24 MED ORDER — VITAMIN B-1 100 MG PO TABS
100.0000 mg | ORAL_TABLET | Freq: Every day | ORAL | 0 refills | Status: AC
  Filled 2024-03-24: qty 100, 100d supply, fill #0

## 2024-03-24 MED ORDER — LOSARTAN POTASSIUM 100 MG PO TABS
100.0000 mg | ORAL_TABLET | Freq: Every day | ORAL | 0 refills | Status: AC
  Filled 2024-03-24: qty 90, 90d supply, fill #0

## 2024-03-24 MED ORDER — PANTOPRAZOLE SODIUM 40 MG PO TBEC
40.0000 mg | DELAYED_RELEASE_TABLET | Freq: Two times a day (BID) | ORAL | Status: DC
  Administered 2024-03-24: 40 mg via ORAL
  Filled 2024-03-24: qty 1

## 2024-03-24 MED ORDER — ONDANSETRON HCL 4 MG PO TABS
4.0000 mg | ORAL_TABLET | Freq: Three times a day (TID) | ORAL | 0 refills | Status: AC | PRN
  Filled 2024-03-24: qty 20, 7d supply, fill #0

## 2024-03-24 MED ORDER — FOLIC ACID 1 MG PO TABS
1.0000 mg | ORAL_TABLET | Freq: Every day | ORAL | 0 refills | Status: AC
  Filled 2024-03-24: qty 90, 90d supply, fill #0

## 2024-03-24 MED ORDER — SENNOSIDES-DOCUSATE SODIUM 8.6-50 MG PO TABS: 1.0000 | ORAL_TABLET | Freq: Two times a day (BID) | ORAL | Status: AC | PRN

## 2024-03-24 MED ORDER — ADULT MULTIVITAMIN W/MINERALS CH: 1.0000 | ORAL_TABLET | Freq: Every day | ORAL | Status: AC

## 2024-03-24 MED ORDER — OXYCODONE HCL 5 MG PO TABS
5.0000 mg | ORAL_TABLET | Freq: Three times a day (TID) | ORAL | 0 refills | Status: AC | PRN
  Filled 2024-03-24: qty 15, 5d supply, fill #0

## 2024-03-24 MED ORDER — NICOTINE 21 MG/24HR TD PT24: 21.0000 mg | MEDICATED_PATCH | Freq: Every day | TRANSDERMAL | Status: AC

## 2024-03-24 NOTE — Discharge Summary (Signed)
 "  Physician Discharge Summary  Camille Dragan FMW:[id number] DOB: [birth year] DOA: 03/21/2024  PCP: Corwin Antu, FNP  Admit date: 03/21/2024 Discharge date: 03/24/24  Admitted From: Home  Disposition: Home Recommendations for Outpatient Follow-up:  Outpatient follow-up with PCP in 1 to 2 weeks Check CMP and CBC in 1 week Please follow up on the following pending results: None  Home Health: No need identified Equipment/Devices: No need identified  Discharge Condition: Stable CODE STATUS: Full code   Follow-up Information     [facility]. Schedule an appointment as soon as possible for a visit.   Why: Chemical Dependency Intensive outpatient program (T/Th/F afternoons) For in person group therapy. Contact information: [address] [place] [address] [phone]         [facility]. Call.   Why: Therapy and meidcation management. One stop shop Contact information: [address] [place]  [phone]        [email or web address] Follow up.   Why: website has list of area support group for survivors/ family members Contact information: [place] Victims assistance program        [place], [place], [place]. Schedule an appointment as soon as possible for a visit in 1 week(s).   Specialty: Family Medicine Contact information: [address] [place] [address] [phone]                 Hospital course 54 year old F with PMH of alcohol abuse, prior EtOH pancreatitis, asthma, HTN, GERD and depression presented to ED with nausea, vomiting and abdominal pain for 2 days, and admitted for acute alcoholic pancreatitis, elevated liver enzymes and possible UTI.  Reportedly drinks about 4 cans of 12 ounce beers a day and sometimes with vodka.  She lost her son in 01/2022 and has been using alcohol for self-destruction.  Last drink was about 2 days prior to  coming to the hospital.  Denies major withdrawal symptoms other than some anxiety and shakiness.   In ED, slightly elevated BP.  Bicarb 21.  AG 15.  AST 75.  ALT 78.  Lipase 1115.  UA concerning for UTI.  CT abdomen and pelvis concerning for mild acute pancreatitis without fluid collection or ductal dilation, proximal duodenitis without perforation or abscess, hepatomegaly with diffuse hepatic steatosis.  Urine culture collected.  Patient was started on analgesics, antiemetics, IV fluid and ceftriaxone  and admitted for further care.   Urine culture with E. coli.  Completed 3 days of IV ceftriaxone  in house.  Pancreatitis improved.  Lipase trended from 1400-122.  She tolerated full liquid diet and requested discharge.  She is advised to continue full liquid diet and slowly start soft bland diet.  Counseled on the importance of alcohol cessation.  Provided with resources for grief and alcohol.  Advised to follow-up with PCP in 1 week.  Recommend checking CMP and CBC in 1 week  See individual problem list below for more.   Problems addressed during this hospitalization Acute alcoholic pancreatitis-prior history of alcoholic pancreatitis.  CT with mild acute pancreatitis without complication.  Lipase 1150>> 1400>> 122.  Pain and lipase improved.  Tolerated full liquid diet. - Discharged on full liquid diet and advised to advance to soft bland diet as tolerated. -Gave Rx for oxycodone  5 mg for 5 days. - Encouraged alcohol cessation.  She was provided with resources.   EtOH abuse with mild withdrawal: Reports drinking 4 self-destruction after she lost his son in  November 2025.  Reports drinking 4 of 12 ounce cans beer and sometimes with vodka.  Last drink about 3 days prior.  Denies major withdrawal such as seizure or delirium. -Continue multivitamin, thiamine  and folic acid  -Encouraged alcohol cessation. -TOC provided patient with resources.   Grief/history of depression: She says her son killed in  01/2024.  Also has a history of depression.  She reports depressed mood but denies SI or HI.  Discussed with psychiatry nurse practitioner who recommended supportive care and outpatient resources. - TOC provided patient with resources.   Proximal duodenitis: Likely in the setting of acute pancreatitis.   Abnormal lfts: Pattern consistent with alcohol.  CK normal.  LFT improved.   Possible UTI: UA with trace pyuria, positive nitrite and many bacteria.  Urine culture was E. coli. - Completed 3 days of IV ceftriaxone  in house.   Essential hypertension: BP slightly elevated partly due to pain. - Discontinued HCTZ and continue losartan . - Reassess BP at follow-up.   Hypomagnesemia/hypophosphatemia: Resolved.  Elevated TSH: Mild.  Free T4 normal.   Mild intermittent asthma: Stable   Tobacco abuse  -encourage cessation  -nicotine  patch    GERD -ppi  Body mass index is 28.98 kg/m.           Consultations: None  Time spent 35  minutes  Vital signs Vitals:   03/23/24 1457 03/23/24 2045 03/24/24 0619 03/24/24 1326  BP: (!) 147/96 (!) 141/89 (!) 153/95 (!) 159/94  Pulse: 79 81 72 73  Temp:  98.9 F (37.2 C) 98.6 F (37 C) 98.3 F (36.8 C)  Resp:  16 17 18   Height:      Weight:      SpO2:  95% 96% 97%  TempSrc:  Oral Oral Oral  BMI (Calculated):         Discharge exam  GENERAL: No apparent distress.  Nontoxic. HEENT: MMM.  Vision and hearing grossly intact.  NECK: Supple.  No apparent JVD.  RESP:  No IWOB.  Fair aeration bilaterally. CVS:  RRR. Heart sounds normal.  ABD/GI/GU: BS+. Abd soft, NTND.  MSK/EXT:  Moves extremities. No apparent deformity. No edema.  SKIN: no apparent skin lesion or wound NEURO: Awake and alert. Oriented appropriately.  No apparent focal neuro deficit. PSYCH: Calm. Normal affect.   Discharge Instructions Discharge Instructions     Discharge instructions   Complete by: As directed    It has been a pleasure taking care of  you!  You were hospitalized acute pancreatitis likely due to alcohol.  Your symptoms improved.  It is very important that you refrain from drinking alcohol.  Taking medications as prescribed.  Follow-up with your primary care doctor in 1 to 2 weeks or sooner if needed.  Continue full liquid diet for a day or two and slowly advance to soft low fat diet afterward.You may advance to regular diet if you continue to tolerate soft diet.   Take care,   Increase activity slowly   Complete by: As directed       Allergies as of 03/24/2024       Reactions   Ciprofloxacin Anaphylaxis, Hives   Kiwi Extract Anaphylaxis   Metoprolol     Other    Ticks, beef, lamb, pork   Propranolol  Other (See Comments)   fatigue   Sulfa Antibiotics Hives   Valsartan  Other (See Comments)   Facial flushing        Medication List     STOP taking these medications  losartan -hydrochlorothiazide 100-25 MG tablet Commonly known as: HYZAAR       TAKE these medications    busPIRone  5 MG tablet Commonly known as: BUSPAR  Take 1 tablet (5 mg total) by mouth 2 (two) times daily.   clobetasol  ointment 0.05 % Commonly known as: TEMOVATE  Apply 1 Application topically 2 (two) times daily. twice daily to lower legs x 2 weeks on and 2 weeks off until clear when condition is flared.   cyanocobalamin  1000 MCG/ML injection Commonly known as: VITAMIN B12 Inject 1 ml 1000 mcg total into muscle once a week for three weeks then once a month for three months   EPINEPHrine  0.3 mg/0.3 mL Soaj injection Commonly known as: EPI-PEN Inject 0.3 mg into the muscle as needed for anaphylaxis.   folic acid  1 MG tablet Commonly known as: FOLVITE  Take 1 tablet (1 mg total) by mouth daily. Start taking on: March 25, 2024   hydrOXYzine  25 MG tablet Commonly known as: ATARAX  Take 1-2 tablets tid prn anxiety   losartan  100 MG tablet Commonly known as: COZAAR  Take 1 tablet (100 mg total) by mouth daily. Start taking on:  March 25, 2024   Luer Lock Safety Syringes 25G X 1 3 ML Misc Generic drug: SYRINGE-NEEDLE (DISP) 3 ML USE TO INJECT B12 INTO THE MUSCLE ONCE WEEKLY FOR 4 WEEK THEN USE ONCE MONTHLY FOR 3 MONTHS   multivitamin with minerals Tabs tablet Take 1 tablet by mouth daily. Start taking on: March 25, 2024   nicotine  21 mg/24hr patch Commonly known as: NICODERM CQ  - dosed in mg/24 hours Place 1 patch (21 mg total) onto the skin daily. Start taking on: March 25, 2024   ondansetron  4 MG tablet Commonly known as: ZOFRAN  Take 1 tablet (4 mg total) by mouth every 8 (eight) hours as needed for nausea or vomiting.   oxyCODONE  5 MG immediate release tablet Commonly known as: Roxicodone  Take 1 tablet (5 mg total) by mouth every 8 (eight) hours as needed for up to 5 days for moderate pain (pain score 4-6) or severe pain (pain score 7-10).   pantoprazole  40 MG tablet Commonly known as: Protonix  Take 1 tablet (40 mg total) by mouth daily for 15 days.   senna-docusate 8.6-50 MG tablet Commonly known as: Senokot-S Take 1-2 tablets by mouth 2 (two) times daily between meals as needed for mild constipation or moderate constipation.   thiamine  100 MG tablet Commonly known as: VITAMIN B1 Take 1 tablet (100 mg total) by mouth daily. Start taking on: March 25, 2024         Procedures/Studies:   OHIO Abd Portable 1V Result Date: 03/22/2024 EXAM: 1 VIEW XRAY OF THE ABDOMEN 03/22/2024 05:37:00 PM COMPARISON: None available. CLINICAL HISTORY: Abdominal pain FINDINGS: BOWEL: Nonobstructive bowel gas pattern. SOFT TISSUES: Partially imaged right lower quadrant generator with spinal cord stimulator leads projecting over the lower thoracic spine. Right upper quadrant surgical clips noted. BONES: No acute fracture. IMPRESSION: 1. No acute abdominal radiographic abnormality. 2. Partially imaged spinal cord stimulator and right upper quadrant surgical clips. Electronically signed by: Greig Pique MD  03/22/2024 05:48 PM EST RP Workstation: [id number]   CT ABDOMEN PELVIS W CONTRAST Result Date: 03/21/2024 EXAM: CT ABDOMEN AND PELVIS WITH CONTRAST 03/21/2024 11:26:50 PM TECHNIQUE: CT of the abdomen and pelvis was performed with the administration of 100 mL of iohexol  (OMNIPAQUE ) 300 MG/ML solution. Multiplanar reformatted images are provided for review. Automated exposure control, iterative reconstruction, and/or weight-based adjustment of the mA/kV was utilized to  reduce the radiation dose to as low as reasonably achievable. COMPARISON: Abdominal ultrasound 01/21/2024 and CT abdomen and pelvis 01/21/2024. CLINICAL HISTORY: Pancreatitis suspected. FINDINGS: LOWER CHEST: No acute abnormality. LIVER: Diffuse fatty infiltration of the liver and enlargement of the liver. GALLBLADDER AND BILE DUCTS: The gallbladder is surgically absent. No biliary ductal dilatation. SPLEEN: Calcified granulomas in the spleen. PANCREAS: Mild inflammatory stranding surrounding the pancreatic head. No evidence for fluid collection or ductal dilatation. ADRENAL GLANDS: No acute abnormality. KIDNEYS, URETERS AND BLADDER: No stones in the kidneys or ureters. No hydronephrosis. No perinephric or periureteral stranding. Urinary bladder is unremarkable. GI AND BOWEL: Stomach demonstrates no acute abnormality. There is wall thickening and inflammation of the proximal duodenum. There is no evidence for perforation or abscess. The appendix is within normal limits. There is no bowel obstruction. PERITONEUM AND RETROPERITONEUM: No ascites. No free air. VASCULATURE: Aorta is normal in caliber. There are atherosclerotic calcifications of the aorta. LYMPH NODES: No lymphadenopathy. REPRODUCTIVE ORGANS: No acute abnormality. BONES AND SOFT TISSUES: No acute osseous abnormality. Thoracic spinal cord stimulator device present. No focal soft tissue abnormality. IMPRESSION: 1. Findings consistent with mild acute pancreatitis, without fluid collection or  ductal dilatation. 2. Proximal duodenitis, without perforation or abscess. 3. Hepatomegaly with diffuse hepatic steatosis. Electronically signed by: Greig Pique MD 03/21/2024 11:34 PM EST RP Workstation: [id number]       The results of significant diagnostics from this hospitalization (including imaging, microbiology, ancillary and laboratory) are listed below for reference.     Microbiology: Recent Results (from the past 240 hours)  Urine Culture     Status: Abnormal   Collection Time: 03/21/24 12:22 AM   Specimen: Urine, Clean Catch  Result Value Ref Range Status   Specimen Description   Final    URINE, CLEAN CATCH Performed at [facility], [address], [place], [place] [address]    Special Requests   Final    NONE Performed at [facility], [address], [place], [place] [address]    Culture >=100,000 COLONIES/mL ESCHERICHIA COLI (A)  Final   Report Status 03/24/2024 FINAL  Final   Organism ID, Bacteria ESCHERICHIA COLI (A)  Final      Susceptibility   Escherichia coli - MIC*    AMPICILLIN >=32 RESISTANT Resistant     CEFAZOLIN  (URINE) Value in next row Sensitive      4 SENSITIVEThis is a modified FDA-approved test that has been validated and its performance characteristics determined by the reporting laboratory.  This laboratory is certified under the Clinical Laboratory Improvement Amendments CLIA as qualified to perform high complexity clinical laboratory testing.    CEFEPIME Value in next row Sensitive      4 SENSITIVEThis is a modified FDA-approved test that has been validated and its performance characteristics determined by the reporting laboratory.  This laboratory is certified under the Clinical Laboratory Improvement Amendments CLIA as qualified to perform high complexity clinical laboratory testing.    ERTAPENEM Value in next row Sensitive      4 SENSITIVEThis is a modified FDA-approved test that has been validated and its  performance characteristics determined by the reporting laboratory.  This laboratory is certified under the Clinical Laboratory Improvement Amendments CLIA as qualified to perform high complexity clinical laboratory testing.    CEFTRIAXONE  Value in next row Sensitive      4 SENSITIVEThis is a modified FDA-approved test that has been validated and its performance characteristics determined by the reporting laboratory.  This laboratory  is certified under the Clinical Laboratory Improvement Amendments CLIA as qualified to perform high complexity clinical laboratory testing.    CIPROFLOXACIN Value in next row Resistant      4 SENSITIVEThis is a modified FDA-approved test that has been validated and its performance characteristics determined by the reporting laboratory.  This laboratory is certified under the Clinical Laboratory Improvement Amendments CLIA as qualified to perform high complexity clinical laboratory testing.    GENTAMICIN Value in next row Sensitive      4 SENSITIVEThis is a modified FDA-approved test that has been validated and its performance characteristics determined by the reporting laboratory.  This laboratory is certified under the Clinical Laboratory Improvement Amendments CLIA as qualified to perform high complexity clinical laboratory testing.    NITROFURANTOIN  Value in next row Sensitive      4 SENSITIVEThis is a modified FDA-approved test that has been validated and its performance characteristics determined by the reporting laboratory.  This laboratory is certified under the Clinical Laboratory Improvement Amendments CLIA as qualified to perform high complexity clinical laboratory testing.    TRIMETH/SULFA Value in next row Resistant      4 SENSITIVEThis is a modified FDA-approved test that has been validated and its performance characteristics determined by the reporting laboratory.  This laboratory is certified under the Clinical Laboratory Improvement Amendments CLIA as qualified  to perform high complexity clinical laboratory testing.    AMPICILLIN/SULBACTAM Value in next row Intermediate      4 SENSITIVEThis is a modified FDA-approved test that has been validated and its performance characteristics determined by the reporting laboratory.  This laboratory is certified under the Clinical Laboratory Improvement Amendments CLIA as qualified to perform high complexity clinical laboratory testing.    PIP/TAZO Value in next row Sensitive      <=4 SENSITIVEThis is a modified FDA-approved test that has been validated and its performance characteristics determined by the reporting laboratory.  This laboratory is certified under the Clinical Laboratory Improvement Amendments CLIA as qualified to perform high complexity clinical laboratory testing.    MEROPENEM Value in next row Sensitive      <=4 SENSITIVEThis is a modified FDA-approved test that has been validated and its performance characteristics determined by the reporting laboratory.  This laboratory is certified under the Clinical Laboratory Improvement Amendments CLIA as qualified to perform high complexity clinical laboratory testing.    * >=100,000 COLONIES/mL ESCHERICHIA COLI     Labs:  CBC: Recent Labs  Lab 03/21/24 2051 03/22/24 0214 03/22/24 0422 03/22/24 1010 03/23/24 0448 03/24/24 0505  WBC 10.8* 9.5 8.4 7.0 7.3 6.6  NEUTROABS 7.7 6.8  --   --   --   --   HGB 15.4* 14.2 13.8 14.0 13.4 12.8  HCT 44.4 42.4 40.7 40.9 39.1 37.7  MCV 99.8 101.9* 101.5* 101.2* 100.8* 101.3*  PLT 224 218 180 183 174 165   BMP &GFR Recent Labs  Lab 03/21/24 2051 03/22/24 0214 03/22/24 0422 03/22/24 1010 03/23/24 0448 03/24/24 0505  NA 138 137 137  --  136 137  K 3.8 3.7 3.6  --  4.0 4.4  CL 103 102 102  --  102 104  CO2 21* 22 25  --  24 23  GLUCOSE 80 93 101*  --  83 92  BUN 11 10 10   --  6 7  CREATININE 0.58 0.61 0.54 0.61 0.56 0.57  CALCIUM  8.8* 8.6* 8.1*  --  8.7* 8.8*  MG  --   --   --   --  1.5* 1.9  PHOS  --    --   --   --  2.1* 2.2*   Estimated Creatinine Clearance: 92.6 mL/min (by C-G formula based on SCr of 0.57 mg/dL). Liver & Pancreas: Recent Labs  Lab 03/21/24 2051 03/22/24 0214 03/22/24 0422 03/23/24 0448 03/24/24 0505  AST 75* 91* 91* 112* 66*  ALT 78* 76* 76* 97* 77*  ALKPHOS 93 89 86 85 80  BILITOT 0.5 0.7 0.7 0.6 0.5  PROT 7.9 7.4 7.1 6.7 6.4*  ALBUMIN 4.2 4.0 3.8 3.7 3.5   Recent Labs  Lab 03/21/24 2051 03/22/24 0214 03/23/24 0448 03/24/24 0505  LIPASE 1,115* 1,393* 163* 122*   No results for input(s): AMMONIA in the last 168 hours. Diabetic: Recent Labs    03/22/24 1010  HGBA1C 6.0*   No results for input(s): GLUCAP in the last 168 hours. Cardiac Enzymes: Recent Labs  Lab 03/23/24 0448 03/24/24 0505  CKTOTAL 71 67   No results for input(s): PROBNP in the last 8760 hours. Coagulation Profile: Recent Labs  Lab 03/22/24 0422  INR 1.0   Thyroid  Function Tests: Recent Labs    03/22/24 1010 03/23/24 0448  TSH 5.270*  --   FREET4  --  1.11   Lipid Profile: Recent Labs    03/22/24 1010  CHOL 150  HDL 65  LDLCALC 64  TRIG 104  CHOLHDL 2.3   Anemia Panel: No results for input(s): VITAMINB12, FOLATE, FERRITIN, TIBC, IRON, RETICCTPCT in the last 72 hours. Urine analysis:    Component Value Date/Time   COLORURINE YELLOW 03/21/2024 2235   APPEARANCEUR HAZY (A) 03/21/2024 2235   APPEARANCEUR Clear 09/24/2023 1510   LABSPEC 1.021 03/21/2024 2235   PHURINE 5.0 03/21/2024 2235   GLUCOSEU NEGATIVE 03/21/2024 2235   GLUCOSEU NEGATIVE 09/17/2023 1003   HGBUR MODERATE (A) 03/21/2024 2235   BILIRUBINUR NEGATIVE 03/21/2024 2235   BILIRUBINUR Negative 09/24/2023 1510   KETONESUR 20 (A) 03/21/2024 2235   PROTEINUR 30 (A) 03/21/2024 2235   UROBILINOGEN 0.2 09/17/2023 1003   UROBILINOGEN 0.2 09/17/2023 0952   NITRITE POSITIVE (A) 03/21/2024 2235   LEUKOCYTESUR TRACE (A) 03/21/2024 2235   Sepsis Labs: Invalid input(s):  PROCALCITONIN, LACTICIDVEN   SIGNED:  Marke Goodwyn T Kerstyn Coryell, MD  [facility] 03/24/2024, 10:38 PM   "

## 2024-03-24 NOTE — Plan of Care (Signed)

## 2024-03-24 NOTE — Progress Notes (Signed)
 Discharge medications delivered to patient at the bedside in a secure bag.

## 2024-03-25 ENCOUNTER — Telehealth: Payer: Self-pay

## 2024-03-25 NOTE — Transitions of Care (Post Inpatient/ED Visit) (Signed)
 "  03/25/2024  Name: Tammy Decker MRN: [id number] DOB: 1970/04/25  Today's TOC FU Call Status: Today's TOC FU Call Status:: Successful TOC FU Call Completed TOC FU Call Complete Date: 03/25/24  Patient's Name and Date of Birth confirmed. Name, DOB  Transition Care Management Follow-up Telephone Call Date of Discharge: 03/24/24 Discharge Facility: [facility]) Type of Discharge: Inpatient Admission Primary Inpatient Discharge Diagnosis:: pancreatitis How have you been since you were released from the hospital?: Better Any questions or concerns?: No  Items Reviewed: Did you receive and understand the discharge instructions provided?: Yes Medications obtained,verified, and reconciled?: Yes (Medications Reviewed) Any new allergies since your discharge?: No Dietary orders reviewed?: Yes Do you have support at home?: Yes People in Home [RPT]: spouse  Medications Reviewed Today: Medications Reviewed Today     Reviewed by Emmitt Pan, LPN (Licensed Practical Nurse) on 03/25/24 at [phone]  Med List Status: <None>   Medication Order Taking? Sig Documenting Provider Last Dose Status Informant  busPIRone  (BUSPAR ) 5 MG tablet [id number] Yes Take 1 tablet (5 mg total) by mouth 2 (two) times daily. Dugal, Tabitha, FNP  Active Self           Med Note ALLEGRA GRASS I   Mon Mar 22, 2024  3:25 AM) Hasn't started  clobetasol  ointment (TEMOVATE ) 0.05 % [id number]  Apply 1 Application topically 2 (two) times daily. twice daily to lower legs x 2 weeks on and 2 weeks off until clear when condition is flared.  Patient not taking: Reported on 03/25/2024   Alm Delon SAILOR, DO  Active Self  cyanocobalamin  (VITAMIN B12) 1000 MCG/ML injection [id number]  Inject 1 ml 1000 mcg total into muscle once a week for three weeks then once a month for three months  Patient not taking: Reported on 03/25/2024   Corwin Antu, FNP  Active Self  EPINEPHrine  0.3 mg/0.3 mL IJ SOAJ injection [id number]  Yes Inject 0.3 mg into the muscle as needed for anaphylaxis. Corwin Antu, FNP  Active Self  folic acid  (FOLVITE ) 1 MG tablet [id number] Yes Take 1 tablet (1 mg total) by mouth daily. Gonfa, Taye T, MD  Active   hydrOXYzine  (ATARAX ) 25 MG tablet [id number]  Take 1-2 tablets tid prn anxiety  Patient not taking: Reported on 03/25/2024   Dugal, Tabitha, FNP  Active Self  losartan  (COZAAR ) 100 MG tablet [id number] Yes Take 1 tablet (100 mg total) by mouth daily. Gonfa, Taye T, MD  Active   LUER LOCK SAFETY SYRINGES 25G X 1 3 ML MISC [id number] Yes USE TO INJECT B12 INTO THE MUSCLE ONCE WEEKLY FOR 4 WEEK THEN USE ONCE MONTHLY FOR 3 MONTHS Dugal, Tabitha, FNP  Active Self  Multiple Vitamin (MULTIVITAMIN WITH MINERALS) TABS tablet [id number] Yes Take 1 tablet by mouth daily. Gonfa, Taye T, MD  Active   nicotine  (NICODERM CQ  - DOSED IN MG/24 HOURS) 21 mg/24hr patch [id number] Yes Place 1 patch (21 mg total) onto the skin daily. Gonfa, Taye T, MD  Active   ondansetron  (ZOFRAN ) 4 MG tablet [id number] Yes Take 1 tablet (4 mg total) by mouth every 8 (eight) hours as needed for nausea or vomiting. Gonfa, Taye T, MD  Active   oxyCODONE  (ROXICODONE ) 5 MG immediate release tablet [id number] Yes Take 1 tablet (5 mg total) by mouth every 8 (eight) hours as needed for up to 5 days for moderate pain (pain score 4-6) or severe pain (pain score 7-10). Gonfa, Taye T, MD  Active   pantoprazole  (PROTONIX ) 40  MG tablet [id number] Yes Take 1 tablet (40 mg total) by mouth daily for 15 days. Charmayne Holmes, DO  Active   senna-docusate (SENOKOT-S) 8.6-50 MG tablet [id number] Yes Take 1-2 tablets by mouth 2 (two) times daily between meals as needed for mild constipation or moderate constipation. Gonfa, Taye T, MD  Active   thiamine  (VITAMIN B-1) 100 MG tablet [id number] Yes Take 1 tablet (100 mg total) by mouth daily. Gonfa, Taye T, MD  Active             Home Care and Equipment/Supplies: Were Home Health Services Ordered?: NA Any  new equipment or medical supplies ordered?: NA  Functional Questionnaire: Do you need assistance with bathing/showering or dressing?: No Do you need assistance with meal preparation?: No Do you need assistance with eating?: No Do you have difficulty maintaining continence: No Do you need assistance with getting out of bed/getting out of a chair/moving?: No Do you have difficulty managing or taking your medications?: No  Follow up appointments reviewed: PCP Follow-up appointment confirmed?: Yes Date of PCP follow-up appointment?: 03/31/24 Follow-up Provider: [facility] Follow-up appointment confirmed?: NA Do you need transportation to your follow-up appointment?: No Do you understand care options if your condition(s) worsen?: Yes-patient verbalized understanding    SIGNATURE Julian Lemmings, LPN [facility] Nurse Health Advisor Direct Dial [phone]  "

## 2024-03-30 ENCOUNTER — Encounter: Payer: Self-pay | Admitting: *Deleted

## 2024-03-31 ENCOUNTER — Telehealth: Payer: Self-pay | Admitting: Family

## 2024-03-31 ENCOUNTER — Encounter: Payer: Self-pay | Admitting: Family

## 2024-03-31 DIAGNOSIS — K2980 Duodenitis without bleeding: Secondary | ICD-10-CM

## 2024-03-31 DIAGNOSIS — R7989 Other specified abnormal findings of blood chemistry: Secondary | ICD-10-CM

## 2024-03-31 DIAGNOSIS — K852 Alcohol induced acute pancreatitis without necrosis or infection: Secondary | ICD-10-CM

## 2024-03-31 DIAGNOSIS — F1010 Alcohol abuse, uncomplicated: Secondary | ICD-10-CM

## 2024-03-31 DIAGNOSIS — R160 Hepatomegaly, not elsewhere classified: Secondary | ICD-10-CM

## 2024-03-31 DIAGNOSIS — F4381 Prolonged grief disorder: Secondary | ICD-10-CM

## 2024-03-31 DIAGNOSIS — I10 Essential (primary) hypertension: Secondary | ICD-10-CM

## 2024-03-31 DIAGNOSIS — Z87440 Personal history of urinary (tract) infections: Secondary | ICD-10-CM | POA: Insufficient documentation

## 2024-03-31 NOTE — Progress Notes (Signed)
 "  Virtual Visit via Video note  I connected with Tammy Decker on 03/31/24 at home by video and verified that I am speaking with the correct person using two identifiers.The provider, Ginger Patrick, FNP is located in their home at time of visit.  I discussed the limitations, risks, security and privacy concerns of performing an evaluation and management service by video and the availability of in person appointments. I also discussed with the patient that there may be a patient responsible charge related to this service. The patient expressed understanding and agreed to proceed.  Subjective: PCP: Patrick Ginger, FNP  Chief Complaint  Patient presents with   Hospitalization Follow-up    HPI  Discussed the use of AI scribe software for clinical note transcription with the patient, who gave verbal consent to proceed.  History of Present Illness Tammy Decker is a 54 year old female with acute pancreatitis who presents for follow-up after recent hospitalization.  She was recently hospitalized and discharged with diagnoses including acute pancreatitis, acute alcoholic pancreatitis, alcohol use disorder with mild withdrawal, proximal duodenitis, abnormal liver function tests, and a urinary tract infection. She reports significant improvement in her condition, although she continues to experience some pain on the left upper side that semi-radiates to the back. This pain is described as 'much improved' compared to her initial presentation.  During her hospitalization, her dietary intake was initially restricted to a liquid diet, which was poorly tolerated, leading to a transition to a full liquid diet. She is now slowly advancing to a solid diet but can only consume small amounts due to pain if she eats more than a little.  She mentions bruising on her abdomen, described as looking like a 'blueberry muffin,' attributed to heparin  use during her hospital stay.  She is currently  taking pantoprazole . She is also starting buspirone  (plans to start today) and plans to follow up on its effects.  She acknowledges a history of alcohol use and is actively working on coping strategies to prevent relapse, including therapy and support systems.she has signed up with a sponsor as well as pursuing therapy.    ROS: Per HPI Current Medications[1]  Observations/Objective: Physical Exam Constitutional:      General: She is not in acute distress.    Appearance: Normal appearance. She is not ill-appearing.  Pulmonary:     Effort: Pulmonary effort is normal.  Neurological:     General: No focal deficit present.     Mental Status: She is alert and oriented to person, place, and time.  Psychiatric:        Mood and Affect: Mood normal.        Behavior: Behavior normal.        Thought Content: Thought content normal.     Assessment and Plan Assessment & Plan Alcohol-induced acute pancreatitis Acute pancreatitis secondary to alcohol abuse with significant improvement in symptoms. Pain in the left upper quadrant radiating to the back has improved. Currently on a full liquid diet with difficulty tolerating solid foods due to pain. - Continue pantoprazole  40 mg oral daily for 15 days - Maintain a bland diet and gradually advance as tolerated -repeat cbc cmp and lipase   Alcohol abuse Recent hospitalization due to alcohol-induced acute pancreatitis. She has support systems in place and is engaging in therapy. Encouraged to journal and identify triggers to prevent future episodes. - Start buspirone  5 mg oral bid - Encouraged journaling and identifying triggers - Continue therapy sessions  Duodenitis Proximal  duodenitis noted during recent hospitalization. Managed with pantoprazole . - Continue pantoprazole  40 mg oral daily for 15 days -will continue to monitor for symptoms  -could consider h pylori testing if sx persist however suspect due to n/v and alcohol intake at the time  of evaluation   Abnormal liver function tests Noted during recent hospitalization, likely related to alcohol abuse and acute pancreatitis.will repeat cmp today   Major depressive disorder, recurrent Recurrent major depressive disorder with ongoing therapy. Buspirone  initiated to manage symptoms. - Start buspirone  5 mg oral bid - Continue therapy sessions       Follow Up Instructions: Return in about 1 month (around 05/01/2024) for f/u depression.   I discussed the assessment and treatment plan with the patient. The patient was provided an opportunity to ask questions and all were answered. The patient agreed with the plan and demonstrated an understanding of the instructions.   The patient was advised to call back or seek an in-person evaluation if the symptoms worsen or if the condition fails to improve as anticipated.  The above assessment and management plan was discussed with the patient. The patient verbalized understanding of and has agreed to the management plan. Patient is aware to call the clinic if symptoms persist or worsen. Patient is aware when to return to the clinic for a follow-up visit. Patient educated on when it is appropriate to go to the emergency department.     Ginger Patrick, MSN, APRN, FNP-C [facility]    [1]  Current Outpatient Medications:    busPIRone  (BUSPAR ) 5 MG tablet, Take 1 tablet (5 mg total) by mouth 2 (two) times daily., Disp: 60 tablet, Rfl: 2   clobetasol  ointment (TEMOVATE ) 0.05 %, Apply 1 Application topically 2 (two) times daily. twice daily to lower legs x 2 weeks on and 2 weeks off until clear when condition is flared., Disp: 60 g, Rfl: 1   cyanocobalamin  (VITAMIN B12) 1000 MCG/ML injection, Inject 1 ml 1000 mcg total into muscle once a week for three weeks then once a month for three months, Disp: 4 mL, Rfl: 3   EPINEPHrine  0.3 mg/0.3 mL IJ SOAJ injection, Inject 0.3 mg into the muscle as needed for anaphylaxis.,  Disp: 1 each, Rfl: 1   folic acid  (FOLVITE ) 1 MG tablet, Take 1 tablet (1 mg total) by mouth daily., Disp: 90 tablet, Rfl: 0   hydrOXYzine  (ATARAX ) 25 MG tablet, Take 1-2 tablets tid prn anxiety, Disp: 30 tablet, Rfl: 0   losartan  (COZAAR ) 100 MG tablet, Take 1 tablet (100 mg total) by mouth daily., Disp: 90 tablet, Rfl: 0   LUER LOCK SAFETY SYRINGES 25G X 1 3 ML MISC, USE TO INJECT B12 INTO THE MUSCLE ONCE WEEKLY FOR 4 WEEK THEN USE ONCE MONTHLY FOR 3 MONTHS, Disp: 7 each, Rfl: 0   Multiple Vitamin (MULTIVITAMIN WITH MINERALS) TABS tablet, Take 1 tablet by mouth daily., Disp: , Rfl:    nicotine  (NICODERM CQ  - DOSED IN MG/24 HOURS) 21 mg/24hr patch, Place 1 patch (21 mg total) onto the skin daily., Disp: , Rfl:    ondansetron  (ZOFRAN ) 4 MG tablet, Take 1 tablet (4 mg total) by mouth every 8 (eight) hours as needed for nausea or vomiting., Disp: 20 tablet, Rfl: 0   pantoprazole  (PROTONIX ) 40 MG tablet, Take 1 tablet (40 mg total) by mouth daily for 15 days., Disp: 15 tablet, Rfl: 0   senna-docusate (SENOKOT-S) 8.6-50 MG tablet, Take 1-2 tablets by mouth 2 (two) times  daily between meals as needed for mild constipation or moderate constipation., Disp: , Rfl:    thiamine  (VITAMIN B-1) 100 MG tablet, Take 1 tablet (100 mg total) by mouth daily., Disp: 100 tablet, Rfl: 0  "

## 2024-03-31 NOTE — Telephone Encounter (Signed)
 Please print office visit from today 1/28 and also ER discharge summary information from recent hospital visit and place up front for pt to pick up tomorrow when she comes for labs

## 2024-04-01 ENCOUNTER — Other Ambulatory Visit: Payer: PRIVATE HEALTH INSURANCE

## 2024-04-01 DIAGNOSIS — K852 Alcohol induced acute pancreatitis without necrosis or infection: Secondary | ICD-10-CM | POA: Diagnosis not present

## 2024-04-01 DIAGNOSIS — R7989 Other specified abnormal findings of blood chemistry: Secondary | ICD-10-CM

## 2024-04-01 DIAGNOSIS — R160 Hepatomegaly, not elsewhere classified: Secondary | ICD-10-CM

## 2024-04-01 LAB — COMPREHENSIVE METABOLIC PANEL WITH GFR
ALT: 45 U/L — ABNORMAL HIGH (ref 3–35)
AST: 34 U/L (ref 5–37)
Albumin: 4.1 g/dL (ref 3.5–5.2)
Alkaline Phosphatase: 83 U/L (ref 39–117)
BUN: 7 mg/dL (ref 6–23)
CO2: 29 meq/L (ref 19–32)
Calcium: 9.4 mg/dL (ref 8.4–10.5)
Chloride: 105 meq/L (ref 96–112)
Creatinine, Ser: 0.66 mg/dL (ref 0.40–1.20)
GFR: 101.15 mL/min
Glucose, Bld: 112 mg/dL — ABNORMAL HIGH (ref 70–99)
Potassium: 4.3 meq/L (ref 3.5–5.1)
Sodium: 138 meq/L (ref 135–145)
Total Bilirubin: 0.5 mg/dL (ref 0.2–1.2)
Total Protein: 7.3 g/dL (ref 6.0–8.3)

## 2024-04-01 LAB — CBC WITH DIFFERENTIAL/PLATELET
Basophils Absolute: 0.1 10*3/uL (ref 0.0–0.1)
Basophils Relative: 1 % (ref 0.0–3.0)
Eosinophils Absolute: 0.2 10*3/uL (ref 0.0–0.7)
Eosinophils Relative: 2.2 % (ref 0.0–5.0)
HCT: 44.5 % (ref 36.0–46.0)
Hemoglobin: 15.1 g/dL — ABNORMAL HIGH (ref 12.0–15.0)
Lymphocytes Relative: 25.5 % (ref 12.0–46.0)
Lymphs Abs: 2.2 10*3/uL (ref 0.7–4.0)
MCHC: 34 g/dL (ref 30.0–36.0)
MCV: 102.4 fl — ABNORMAL HIGH (ref 78.0–100.0)
Monocytes Absolute: 0.8 10*3/uL (ref 0.1–1.0)
Monocytes Relative: 9.4 % (ref 3.0–12.0)
Neutro Abs: 5.4 10*3/uL (ref 1.4–7.7)
Neutrophils Relative %: 61.9 % (ref 43.0–77.0)
Platelets: 341 10*3/uL (ref 150.0–400.0)
RBC: 4.34 Mil/uL (ref 3.87–5.11)
RDW: 13.4 % (ref 11.5–15.5)
WBC: 8.7 10*3/uL (ref 4.0–10.5)

## 2024-04-01 LAB — LIPASE: Lipase: 153 U/L — ABNORMAL HIGH (ref 11.0–59.0)

## 2024-04-01 LAB — T4, FREE: Free T4: 0.78 ng/dL (ref 0.60–1.60)

## 2024-04-01 LAB — TSH: TSH: 2.88 u[IU]/mL (ref 0.35–5.50)

## 2024-04-01 NOTE — Telephone Encounter (Signed)
 Documents have been printed and placed up front for pick up.

## 2024-04-01 NOTE — Telephone Encounter (Signed)
 Form has been picked up.

## 2024-04-02 ENCOUNTER — Ambulatory Visit: Payer: Self-pay | Admitting: Family
